# Patient Record
Sex: Male | Born: 1935 | Race: White | Hispanic: No | Smoking: Former smoker
Health system: Southern US, Community
[De-identification: ages and names within clinical notes are randomized; demographics above are authoritative.]

## PROBLEM LIST (undated history)

## (undated) DIAGNOSIS — C3490 Malignant neoplasm of unspecified part of unspecified bronchus or lung: Secondary | ICD-10-CM

## (undated) DIAGNOSIS — R972 Elevated prostate specific antigen [PSA]: Secondary | ICD-10-CM

## (undated) DIAGNOSIS — I499 Cardiac arrhythmia, unspecified: Secondary | ICD-10-CM

## (undated) DIAGNOSIS — D649 Anemia, unspecified: Secondary | ICD-10-CM

## (undated) DIAGNOSIS — K219 Gastro-esophageal reflux disease without esophagitis: Secondary | ICD-10-CM

## (undated) DIAGNOSIS — G4700 Insomnia, unspecified: Secondary | ICD-10-CM

## (undated) DIAGNOSIS — R0602 Shortness of breath: Secondary | ICD-10-CM

## (undated) DIAGNOSIS — J449 Chronic obstructive pulmonary disease, unspecified: Secondary | ICD-10-CM

## (undated) DIAGNOSIS — Z9289 Personal history of other medical treatment: Secondary | ICD-10-CM

## (undated) DIAGNOSIS — I10 Essential (primary) hypertension: Principal | ICD-10-CM

## (undated) DIAGNOSIS — I714 Abdominal aortic aneurysm, without rupture, unspecified: Secondary | ICD-10-CM

## (undated) DIAGNOSIS — M25559 Pain in unspecified hip: Secondary | ICD-10-CM

## (undated) DIAGNOSIS — N400 Enlarged prostate without lower urinary tract symptoms: Secondary | ICD-10-CM

## (undated) DIAGNOSIS — N529 Male erectile dysfunction, unspecified: Secondary | ICD-10-CM

## (undated) DIAGNOSIS — E785 Hyperlipidemia, unspecified: Secondary | ICD-10-CM

## (undated) HISTORY — DX: Male erectile dysfunction, unspecified: N52.9

## (undated) HISTORY — DX: Abdominal aortic aneurysm, without rupture, unspecified: I71.40

## (undated) HISTORY — DX: Benign prostatic hyperplasia without lower urinary tract symptoms: N40.0

## (undated) HISTORY — DX: Insomnia, unspecified: G47.00

## (undated) HISTORY — DX: Malignant neoplasm of unspecified part of unspecified bronchus or lung: C34.90

## (undated) HISTORY — PX: UPPER GASTROINTESTINAL ENDOSCOPY: SHX188

## (undated) HISTORY — DX: Pain in unspecified hip: M25.559

## (undated) HISTORY — DX: Hyperlipidemia, unspecified: E78.5

## (undated) HISTORY — PX: TOTAL HIP ARTHROPLASTY: SHX124

## (undated) HISTORY — DX: Abdominal aortic aneurysm, without rupture: I71.4

## (undated) HISTORY — DX: Elevated prostate specific antigen (PSA): R97.20

## (undated) HISTORY — DX: Essential (primary) hypertension: I10

---

## 2002-06-18 HISTORY — PX: TRANSURETHRAL RESECTION OF PROSTATE: SHX73

## 2006-04-25 ENCOUNTER — Ambulatory Visit: Payer: Self-pay | Admitting: Internal Medicine

## 2007-01-02 ENCOUNTER — Encounter: Payer: Self-pay | Admitting: Internal Medicine

## 2007-01-02 ENCOUNTER — Ambulatory Visit: Payer: Self-pay | Admitting: Internal Medicine

## 2007-01-02 DIAGNOSIS — E785 Hyperlipidemia, unspecified: Secondary | ICD-10-CM | POA: Insufficient documentation

## 2007-01-02 DIAGNOSIS — I10 Essential (primary) hypertension: Secondary | ICD-10-CM

## 2007-01-02 HISTORY — DX: Hyperlipidemia, unspecified: E78.5

## 2007-01-02 HISTORY — DX: Essential (primary) hypertension: I10

## 2007-05-02 ENCOUNTER — Encounter: Payer: Self-pay | Admitting: Internal Medicine

## 2007-07-04 ENCOUNTER — Ambulatory Visit: Payer: Self-pay | Admitting: Internal Medicine

## 2007-07-04 DIAGNOSIS — M25559 Pain in unspecified hip: Secondary | ICD-10-CM | POA: Insufficient documentation

## 2007-07-04 HISTORY — DX: Pain in unspecified hip: M25.559

## 2008-07-23 ENCOUNTER — Ambulatory Visit: Payer: Self-pay | Admitting: Internal Medicine

## 2008-07-23 DIAGNOSIS — N400 Enlarged prostate without lower urinary tract symptoms: Secondary | ICD-10-CM

## 2008-07-23 HISTORY — DX: Benign prostatic hyperplasia without lower urinary tract symptoms: N40.0

## 2008-07-26 ENCOUNTER — Telehealth: Payer: Self-pay | Admitting: Internal Medicine

## 2008-07-26 LAB — CONVERTED CEMR LAB
Alkaline Phosphatase: 53 units/L (ref 39–117)
BUN: 24 mg/dL — ABNORMAL HIGH (ref 6–23)
Basophils Relative: 0 % (ref 0–1)
Bilirubin, Direct: 0.1 mg/dL (ref 0.0–0.3)
Chloride: 104 meq/L (ref 96–112)
Creatinine, Ser: 1.48 mg/dL (ref 0.40–1.50)
Eosinophils Absolute: 0.2 10*3/uL (ref 0.0–0.7)
Glucose, Bld: 101 mg/dL — ABNORMAL HIGH (ref 70–99)
Hemoglobin: 15.6 g/dL (ref 13.0–17.0)
Indirect Bilirubin: 0.5 mg/dL (ref 0.0–0.9)
LDL Cholesterol: 91 mg/dL (ref 0–99)
Lymphs Abs: 1.6 10*3/uL (ref 0.7–4.0)
MCHC: 33.6 g/dL (ref 30.0–36.0)
MCV: 90.8 fL (ref 78.0–100.0)
Monocytes Absolute: 0.6 10*3/uL (ref 0.1–1.0)
Monocytes Relative: 8 % (ref 3–12)
Neutro Abs: 4.9 10*3/uL (ref 1.7–7.7)
Neutrophils Relative %: 68 % (ref 43–77)
Potassium: 5 meq/L (ref 3.5–5.3)
RBC: 5.11 M/uL (ref 4.22–5.81)
Total Bilirubin: 0.6 mg/dL (ref 0.3–1.2)
Total Protein: 7.2 g/dL (ref 6.0–8.3)
Triglycerides: 174 mg/dL — ABNORMAL HIGH (ref <150)
VLDL: 35 mg/dL (ref 0–40)
WBC: 7.2 10*3/uL (ref 4.0–10.5)

## 2008-08-09 ENCOUNTER — Telehealth: Payer: Self-pay | Admitting: Internal Medicine

## 2008-09-02 ENCOUNTER — Telehealth: Payer: Self-pay | Admitting: Internal Medicine

## 2008-09-03 ENCOUNTER — Encounter: Payer: Self-pay | Admitting: Internal Medicine

## 2008-09-10 ENCOUNTER — Ambulatory Visit: Payer: Self-pay | Admitting: Internal Medicine

## 2008-09-10 DIAGNOSIS — R972 Elevated prostate specific antigen [PSA]: Secondary | ICD-10-CM

## 2008-09-10 HISTORY — DX: Elevated prostate specific antigen (PSA): R97.20

## 2008-09-28 ENCOUNTER — Encounter: Admission: RE | Admit: 2008-09-28 | Discharge: 2008-09-28 | Payer: Self-pay | Admitting: Orthopedic Surgery

## 2008-10-11 ENCOUNTER — Telehealth: Payer: Self-pay | Admitting: Internal Medicine

## 2008-10-29 ENCOUNTER — Telehealth: Payer: Self-pay | Admitting: Family Medicine

## 2008-11-16 ENCOUNTER — Ambulatory Visit: Payer: Self-pay | Admitting: Internal Medicine

## 2008-11-16 DIAGNOSIS — G4700 Insomnia, unspecified: Secondary | ICD-10-CM

## 2008-11-16 HISTORY — DX: Insomnia, unspecified: G47.00

## 2008-11-26 ENCOUNTER — Inpatient Hospital Stay (HOSPITAL_COMMUNITY): Admission: RE | Admit: 2008-11-26 | Discharge: 2008-11-30 | Payer: Self-pay | Admitting: Orthopedic Surgery

## 2008-12-05 ENCOUNTER — Telehealth: Payer: Self-pay | Admitting: Internal Medicine

## 2008-12-06 ENCOUNTER — Telehealth: Payer: Self-pay | Admitting: Internal Medicine

## 2009-01-12 ENCOUNTER — Telehealth: Payer: Self-pay | Admitting: Internal Medicine

## 2009-02-02 ENCOUNTER — Telehealth (INDEPENDENT_AMBULATORY_CARE_PROVIDER_SITE_OTHER): Payer: Self-pay | Admitting: *Deleted

## 2009-02-07 ENCOUNTER — Telehealth: Payer: Self-pay | Admitting: Internal Medicine

## 2009-06-09 ENCOUNTER — Ambulatory Visit: Payer: Self-pay | Admitting: Internal Medicine

## 2009-07-12 ENCOUNTER — Ambulatory Visit: Payer: Self-pay | Admitting: Internal Medicine

## 2009-07-12 DIAGNOSIS — L723 Sebaceous cyst: Secondary | ICD-10-CM

## 2009-07-15 ENCOUNTER — Ambulatory Visit: Payer: Self-pay | Admitting: Internal Medicine

## 2009-07-15 DIAGNOSIS — R310 Gross hematuria: Secondary | ICD-10-CM | POA: Insufficient documentation

## 2009-07-15 LAB — CONVERTED CEMR LAB
Bilirubin Urine: NEGATIVE
Glucose, Urine, Semiquant: NEGATIVE
Ketones, urine, test strip: NEGATIVE
Protein, U semiquant: NEGATIVE
Urobilinogen, UA: 0.2
pH: 5

## 2009-07-20 ENCOUNTER — Encounter: Payer: Self-pay | Admitting: Internal Medicine

## 2009-07-27 ENCOUNTER — Telehealth: Payer: Self-pay | Admitting: Internal Medicine

## 2009-08-03 ENCOUNTER — Telehealth: Payer: Self-pay | Admitting: Internal Medicine

## 2009-08-11 ENCOUNTER — Encounter: Payer: Self-pay | Admitting: Internal Medicine

## 2009-08-17 ENCOUNTER — Inpatient Hospital Stay (HOSPITAL_COMMUNITY): Admission: RE | Admit: 2009-08-17 | Discharge: 2009-08-20 | Payer: Self-pay | Admitting: Orthopedic Surgery

## 2009-09-26 ENCOUNTER — Encounter: Payer: Self-pay | Admitting: Internal Medicine

## 2009-09-27 ENCOUNTER — Telehealth: Payer: Self-pay

## 2009-11-21 ENCOUNTER — Telehealth: Payer: Self-pay

## 2009-11-22 ENCOUNTER — Ambulatory Visit: Payer: Self-pay | Admitting: Internal Medicine

## 2009-11-22 DIAGNOSIS — N529 Male erectile dysfunction, unspecified: Secondary | ICD-10-CM | POA: Insufficient documentation

## 2009-11-22 HISTORY — DX: Male erectile dysfunction, unspecified: N52.9

## 2010-01-20 ENCOUNTER — Telehealth: Payer: Self-pay | Admitting: Internal Medicine

## 2010-01-27 ENCOUNTER — Encounter: Payer: Self-pay | Admitting: Internal Medicine

## 2010-02-06 ENCOUNTER — Telehealth: Payer: Self-pay

## 2010-02-28 ENCOUNTER — Telehealth: Payer: Self-pay | Admitting: Internal Medicine

## 2010-04-14 ENCOUNTER — Telehealth: Payer: Self-pay | Admitting: Internal Medicine

## 2010-07-08 ENCOUNTER — Encounter: Payer: Self-pay | Admitting: Urology

## 2010-07-18 NOTE — Assessment & Plan Note (Signed)
Summary: EVAL OF BLISTER/BOIL (REMOVAL/LANCE) // RS   Vital Signs:  Patient profile:   77 year old male Weight:      196 pounds Temp:     98.4 degrees F oral BP sitting:   140 / 90  (left arm) Cuff size:   regular  Vitals Entered By: Raechel Ache, RN ([birth year] 2:13 PM)  Procedure Note  Cyst Removal: The patient complains of swelling and changing lesion. Date of onset: 06/08/2008 Indication: changing lesion Consent signed: no  Procedure #1: incision and drainage    Size (in cm): 1.0 x 0.8    Region: dorsal    Location: left first index finger    Instrument used: #15 blade    Anesthesia: 1% lidocaine w/o epinephrine  Cleaned and prepped with: betadine Wound dressing: neosporin and bandaid Additional Instructions: Change Band-Aid and apply Neosporin daily  CC: C/o ?cyst/boil L index finger. Is Patient Diabetic? No   CC:  C/o ?cyst/boil L index finger.Marland Kitchen  History of Present Illness: 77 year old patient with a 6-week history of enlarging nodule involving the dorsal aspect of his right index finger just distal to the DIP joint.  No known, or foreign body exposure  Allergies: No Known Drug Allergies   Complete Medication List: 1)  Simvastatin 20 Mg Tabs (Simvastatin) .Marland Kitchen.. 1 once daily 2)  Diltiazem Hcl Coated Beads 240 Mg Cp24 (Diltiazem hcl coated beads) .Marland Kitchen.. 1 once daily 3)  Cialis 5 Mg Tabs (Tadalafil) .... One daily 4)  Chlorthalidone 25 Mg Tabs (Chlorthalidone) .... One daily  Other Orders: I&D Abscess, Simple / Single (10060)

## 2010-07-18 NOTE — Progress Notes (Signed)
Summary: hip pain  Phone Note Call from Patient Call back at [facility] Phone [phone]   Summary of Call: Trouble right hip.  Might need another hip replacement.  No health insurance.  Green card.  Wonders if there is some means of delaying surgery.  Requests Dr. Kirtland Bouchard call him himself tomorrow am.   Initial call taken by: Rudy Jew, RN,  July 27, 2009 9:49 AM  Follow-up for Phone Call        pt called again.  call [phone] Follow-up by: Warnell Forester,  July 28, 2009 1:24 PM  Additional Follow-up for Phone Call Additional follow up Details #1::        called Additional Follow-up by: Gordy Savers  MD,  July 28, 2009 4:58 PM

## 2010-07-18 NOTE — Progress Notes (Signed)
Summary: Pts growth on finger has reoccured.   Phone Note Call from Patient Call back at [facility] Phone [phone]   Caller: Patient Summary of Call: Pt called and said that his finger a growth on it that has reoccured. Pt would like to know if he needs a referral or to come in to be re-eval.   Pt doesnt have insurance.   Pls leave detailed mssge if pt not available.  Initial call taken by: Lucy Antigua,  November 21, 2009 11:50 AM  Follow-up for Phone Call        attempted to call pt - will need to see to eval - last seen in 06/2009 . call and make appt - left info on ans mach at hm#   KIK Follow-up by: Duard Brady LPN,  November 22, 979 1:59 PM

## 2010-07-18 NOTE — Progress Notes (Signed)
Summary: please return call  Phone Note Call from Patient Call back at Home Phone [phone]   Caller: Patient---live call Summary of Call: pt said he had surgery on his finger in june. Ins company is saying something else. Initial call taken by: Warnell Forester,  April 14, 2010 1:28 PM  Follow-up for Phone Call        Insurance company wants to know if you did a procedure in June 2011 on his finger and what the ICD9 code Follow-up by: Alfred Levins, CMA,  April 14, 2010 1:32 PM  Additional Follow-up for Phone Call Additional follow up Details #1::        dermoid cyst (M9084/0) cryotherapy performed Additional Follow-up by: Gordy Savers  MD,  April 14, 2010 4:54 PM    Additional Follow-up for Phone Call Additional follow up Details #2::    LMTCB Follow-up by: Alfred Levins, CMA,  April 17, 2010 1:33 PM

## 2010-07-18 NOTE — Assessment & Plan Note (Signed)
Summary: hematuria/dm   Vital Signs:  Patient profile:   77 year old male Weight:      197 pounds Temp:     98.5 degrees F oral BP sitting:   142 / 84  (left arm) Cuff size:   regular  Vitals Entered By: Raechel Ache, RN (July 15, 2009 10:46 AM) CC: C/o blood in urine last night Is Patient Diabetic? No   CC:  C/o blood in urine last night.  History of Present Illness: 77 year old patient, who presents after an episode of painless gross hematuria yesterday.  No history kidney stones, or similar episodes in the past.  He does have a history of a prior TURP.  Today he feels well and noted on his first void specimen that it appeared normal.  A UA was reviewed today and revealed microscopic hematuria  Allergies: No Known Drug Allergies  Past History:  Past Medical History: Hyperlipidemia Hypertension left hip pain Benign prostatic hypertrophy gross hematuria, January 2011  Past Surgical History: melanoma left leg status post TURP 2004  No prior colonoscopy  status post left total hip replacement surgery  Review of Systems       The patient complains of hematuria.  The patient denies anorexia, fever, weight loss, weight gain, vision loss, decreased hearing, hoarseness, chest pain, syncope, dyspnea on exertion, peripheral edema, prolonged cough, headaches, hemoptysis, abdominal pain, melena, hematochezia, severe indigestion/heartburn, incontinence, genital sores, muscle weakness, suspicious skin lesions, transient blindness, difficulty walking, depression, unusual weight change, abnormal bleeding, enlarged lymph nodes, angioedema, breast masses, and testicular masses.    Physical Exam  General:  Well-developed,well-nourished,in no acute distress; alert,appropriate and cooperative throughout examination   Impression & Recommendations:  Problem # 1:  GROSS HEMATURIA (ICD-599.71)  Orders: UA Dipstick w/o Micro (manual) ([address]) Urology Referral (Urology)  Problem #  2:  BENIGN PROSTATIC HYPERTROPHY (ICD-600.00)  Problem # 3:  PROSTATE SPECIFIC ANTIGEN, ELEVATED (ICD-790.93)  Complete Medication List: 1)  Simvastatin 20 Mg Tabs (Simvastatin) .Marland Kitchen.. 1 once daily 2)  Diltiazem Hcl Coated Beads 240 Mg Cp24 (Diltiazem hcl coated beads) .Marland Kitchen.. 1 once daily 3)  Cialis 5 Mg Tabs (Tadalafil) .... One daily 4)  Chlorthalidone 25 Mg Tabs (Chlorthalidone) .... One daily  Patient Instructions: 1)  urology referral as scheduled 2)  Please schedule a follow-up appointment as needed.  Laboratory Results   Urine Tests    Routine Urinalysis   Color: yellow Appearance: Clear Glucose: negative   (Normal Range: Negative) Bilirubin: negative   (Normal Range: Negative) Ketone: negative   (Normal Range: Negative) Spec. Gravity: <1.005   (Normal Range: 1.003-1.035) Blood: moderate   (Normal Range: Negative) pH: 5.0   (Normal Range: 5.0-8.0) Protein: negative   (Normal Range: Negative) Urobilinogen: 0.2   (Normal Range: 0-1) Nitrite: negative   (Normal Range: Negative) Leukocyte Esterace: negative   (Normal Range: Negative)

## 2010-07-18 NOTE — Progress Notes (Signed)
Summary: ins form mailed  Phone Note Outgoing Call   Call placed by: Duard Brady LPN,  February 06, 2010 9:14 AM Call placed to: Patient Summary of Call: called and left msg - ins form mailed to house. KIK Initial call taken by: Duard Brady LPN,  February 06, 2010 9:15 AM

## 2010-07-18 NOTE — Letter (Signed)
Summary: Attending Physician's Statement/[employer]  Attending Physician's Statement/[employer]   Imported By: Maryln Gottron 09/29/2009 13:32:06  _____________________________________________________________________  External Attachment:    Type:   Image     Comment:   External Document

## 2010-07-18 NOTE — Progress Notes (Signed)
Summary: recurring mass on finger  Phone Note Call from Patient   Caller: Patient Call For: Gordy Savers  MD Summary of Call: Pt would like to talk to Dr. Kirtland Bouchard about this recurring lesion on finger. [phone] Initial call taken by: Lynann Beaver CMA,  January 20, 2010 10:38 AM  Follow-up for Phone Call        refer dermatology Follow-up by: Gordy Savers  MD,  January 20, 2010 12:56 PM  Additional Follow-up for Phone Call Additional follow up Details #1::        Pt notified.  Order sent to Terri. Additional Follow-up by: Lynann Beaver CMA,  January 20, 2010 1:27 PM

## 2010-07-18 NOTE — Progress Notes (Signed)
Summary: question about referral  Phone Note Call from Patient   Caller: Patient Call For: Gordy Savers  MD Summary of Call: wondering if it would be better to go to surgeon - paying out of pocket - if dermatolgy is going to send to surgeon would rather go there first.  Initial call taken by: Duard Brady LPN,  January 20, 2010 4:43 PM

## 2010-07-18 NOTE — Progress Notes (Signed)
Summary: Pt req samples of Levitra or a script  Phone Note Call from Patient Call back at Home Phone [phone]   Caller: Patient Summary of Call: Pt req samples of Levitra or call in a script to [facility] on [address] [phone] [facility] is too expensive.         Initial call taken by: Lucy Antigua,  February 28, 2010 2:42 PM  Follow-up for Phone Call        levitra 20 mg  #6 i/2 tablt as needed  RF 4 Follow-up by: Gordy Savers  MD,  February 28, 2010 4:50 PM    Prescriptions: LEVITRA 20 MG TABS (VARDENAFIL HCL) use daily as directed  #6 x 6   Entered by:   Duard Brady LPN   Authorized by:   Gordy Savers  MD   Signed by:   Duard Brady LPN on 95/18/8416   Method used:   Electronically to        [facility] [address] (retail)       [phone]       [place], [place]  [address]       Ph: [phone]       Fax: [phone]   RxID:   [id number]

## 2010-07-18 NOTE — Assessment & Plan Note (Signed)
Summary: RECURRING LUMP ON FINGER/PER KIM/CJR   Vital Signs:  Patient profile:   77 year old male Weight:      193 pounds Temp:     98.2 degrees F oral BP sitting:   130 / 80  (right arm) Cuff size:   regular  Vitals Entered By: Duard Brady LPN (November 22, 1025 2:34 PM) CC: c/o re-growth in (L) index finger , painful and drainage Is Patient Diabetic? No   CC:  c/o re-growth in (L) index finger  and painful and drainage.  History of Present Illness: a 77 year old patient who is seen today for follow-up.  He has recurrent inflammatory nodule involving the dorsal aspect of his left index finger.  This was I&D in January of this year.  Other complaints include ED  Allergies: No Known Drug Allergies  Physical Exam  General:  Well-developed,well-nourished,in no acute distress; alert,appropriate and cooperative throughout examination Skin:  1 cm inflammatory nodule already draining spontaneously.  This area was decompressed with pressure.  The inflammatory shallow ulcer was then treated with cryotherapy   Impression & Recommendations:  Problem # 1:  SEBACEOUS CYST (ICD-706.2)  Problem # 2:  ERECTILE DYSFUNCTION, ORGANIC (ICD-607.84)  His updated medication list for this problem includes:    Cialis 5 Mg Tabs (Tadalafil) ..... One daily    Levitra 20 Mg Tabs (Vardenafil hcl) ..... Use daily as directed  His updated medication list for this problem includes:    Cialis 5 Mg Tabs (Tadalafil) ..... One daily    Levitra 20 Mg Tabs (Vardenafil hcl) ..... Use daily as directed  Complete Medication List: 1)  Diltiazem Hcl Coated Beads 240 Mg Cp24 (Diltiazem hcl coated beads) .Marland Kitchen.. 1 once daily 2)  Cialis 5 Mg Tabs (Tadalafil) .... One daily 3)  Chlorthalidone 25 Mg Tabs (Chlorthalidone) .... One daily 4)  Levitra 20 Mg Tabs (Vardenafil hcl) .... Use daily as directed  Patient Instructions: 1)  Limit your Sodium (Salt) to less than 2 grams a day(slightly less than 1/2 a teaspoon)  to prevent fluid retention, swelling, or worsening of symptoms. 2)  It is important that you exercise regularly at least 20 minutes 5 times a week. If you develop chest pain, have severe difficulty breathing, or feel very tired , stop exercising immediately and seek medical attention. 3)  You need to lose weight. Consider a lower calorie diet and regular exercise.  4)  Check your Blood Pressure regularly. If it is above: 150/90 you should make an appointment. Prescriptions: LEVITRA 20 MG TABS (VARDENAFIL HCL) use daily as directed  #6 x 6   Entered and Authorized by:   Gordy Savers  MD   Signed by:   Gordy Savers  MD on 11/22/2009   Method used:   Electronically to        [facility] [phone]* (retail)       [address]       [place], [place]  [address]       Ph: [phone]       Fax: [phone]   RxID:   [id number] CHLORTHALIDONE 25 MG TABS (CHLORTHALIDONE) one daily  #90 x 6   Entered and Authorized by:   Gordy Savers  MD   Signed by:   Gordy Savers  MD on 11/22/2009   Method used:   Electronically to        [facility] # [phone]* (retail)       [phone]  [place], [place]  [address]       Ph: [phone]       Fax: [phone]   RxID:   [id number] DILTIAZEM HCL COATED BEADS 240 MG  CP24 (DILTIAZEM HCL COATED BEADS) 1 once daily  #90 x 6   Entered and Authorized by:   Gordy Savers  MD   Signed by:   Gordy Savers  MD on 11/22/2009   Method used:   Electronically to        [facility] # [phone]* (retail)       [address]       [place], [place]  [address]       Ph: [phone]       Fax: [phone]   RxID:   [id number]

## 2010-07-18 NOTE — Letter (Signed)
Summary: Generic Letter  [facility]  [address] [place], [place] [address]   Phone: [phone]  Fax: [phone]    08/11/2009  Tyler [name] [address] [place], [place]  [address]  Dear Tyler [name]:  [name] is a patient followed in our internal medicine practice.  He successfully underwent left total hip replacement surgery in 2010 and did quite well.  He is considering right total hip replacement surgery due to lifestyle limiting pain. [name] was last seen in our office on January 28 of this year.  At that time, he was clinically stable and denied any cardiopulmonary complaints.   There are no contraindications to the anticipated surgery.  If medical assistance is needed please do not hesitate to contact this office.       Sincerely,   Eleonore Chiquito  MD

## 2010-07-18 NOTE — Consult Note (Signed)
Summary: [facility]  [facility]   Imported By: Maryln Gottron 07/22/2009 15:29:27  _____________________________________________________________________  External Attachment:    Type:   Image     Comment:   External Document

## 2010-07-18 NOTE — Progress Notes (Signed)
Summary: medical clearance  Phone Note Call from Patient   Caller: Patient Call For: Gordy Savers  MD Summary of Call: Needs medical clearance for surgery (hip replacement).  Does not want to make an appt. [phone]  Surgery is March 2. Initial call taken by: Lynann Beaver CMA,  August 03, 2009 10:40 AM  Follow-up for Phone Call        PT CALL BACK NEEDING STATUE OF MEDICAL CLEARANCE DR Despina Hick IS DOING SURGERY. PT WILL BE LEAVING TOWN THIS WEEKEND Follow-up by: Heron Sabins,  August 05, 2009 11:36 AM  Additional Follow-up for Phone Call Additional follow up Details #1::        please fax letter to Dr Darrelyn Hillock and notify patient that letter has been sent Additional Follow-up by: Gordy Savers  MD,  August 11, 2009 5:09 PM    Additional Follow-up for Phone Call Additional follow up Details #2::    letter faxed and pt notified by voice mail. Follow-up by: Lynann Beaver CMA,  August 12, 2009 8:57 AM

## 2010-07-18 NOTE — Letter (Signed)
Summary: Attending Physician's Statement/[employer]  Attending Physician's Statement/[employer]   Imported By: Maryln Gottron 02/10/2010 09:16:22  _____________________________________________________________________  External Attachment:    Type:   Image     Comment:   External Document

## 2010-07-18 NOTE — Progress Notes (Signed)
Summary: ppwk ready for pick up   Phone Note Outgoing Call   Call placed by: Duard Brady LPN,  September 27, 2009 8:18 AM Call placed to: Patient Summary of Call: pt aware ppwk ready for pick up . KIK Initial call taken by: Duard Brady LPN,  September 27, 2009 8:19 AM

## 2010-09-06 LAB — COMPREHENSIVE METABOLIC PANEL
ALT: 21 U/L (ref 0–53)
Albumin: 3.9 g/dL (ref 3.5–5.2)
Alkaline Phosphatase: 54 U/L (ref 39–117)
Chloride: 105 mEq/L (ref 96–112)
Glucose, Bld: 101 mg/dL — ABNORMAL HIGH (ref 70–99)
Potassium: 4.4 mEq/L (ref 3.5–5.1)
Sodium: 142 mEq/L (ref 135–145)
Total Bilirubin: 0.7 mg/dL (ref 0.3–1.2)
Total Protein: 6.9 g/dL (ref 6.0–8.3)

## 2010-09-06 LAB — TYPE AND SCREEN: Antibody Screen: NEGATIVE

## 2010-09-06 LAB — URINALYSIS, ROUTINE W REFLEX MICROSCOPIC
Glucose, UA: NEGATIVE mg/dL
Ketones, ur: NEGATIVE mg/dL
Nitrite: NEGATIVE
Specific Gravity, Urine: 1.016 (ref 1.005–1.030)
pH: 7.5 (ref 5.0–8.0)

## 2010-09-06 LAB — CBC
HCT: 47 % (ref 39.0–52.0)
Hemoglobin: 15.5 g/dL (ref 13.0–17.0)
RDW: 13 % (ref 11.5–15.5)
WBC: 6.7 10*3/uL (ref 4.0–10.5)

## 2010-09-06 LAB — PROTIME-INR: Prothrombin Time: 13.6 seconds (ref 11.6–15.2)

## 2010-09-11 LAB — BASIC METABOLIC PANEL
CO2: 25 mEq/L (ref 19–32)
CO2: 27 mEq/L (ref 19–32)
Calcium: 7.8 mg/dL — ABNORMAL LOW (ref 8.4–10.5)
Chloride: 101 mEq/L (ref 96–112)
Chloride: 103 mEq/L (ref 96–112)
Chloride: 98 mEq/L (ref 96–112)
GFR calc Af Amer: 51 mL/min — ABNORMAL LOW (ref >60)
GFR calc non Af Amer: 42 mL/min — ABNORMAL LOW (ref >60)
GFR calc non Af Amer: 42 mL/min — ABNORMAL LOW (ref >60)
Glucose, Bld: 134 mg/dL — ABNORMAL HIGH (ref 70–99)
Potassium: 3.9 mEq/L (ref 3.5–5.1)
Potassium: 4.2 mEq/L (ref 3.5–5.1)
Potassium: 4.3 mEq/L (ref 3.5–5.1)
Sodium: 132 mEq/L — ABNORMAL LOW (ref 135–145)
Sodium: 135 mEq/L (ref 135–145)
Sodium: 136 mEq/L (ref 135–145)

## 2010-09-11 LAB — CBC
HCT: 30.4 % — ABNORMAL LOW (ref 39.0–52.0)
HCT: 33 % — ABNORMAL LOW (ref 39.0–52.0)
HCT: 35.3 % — ABNORMAL LOW (ref 39.0–52.0)
Hemoglobin: 11.1 g/dL — ABNORMAL LOW (ref 13.0–17.0)
Hemoglobin: 12 g/dL — ABNORMAL LOW (ref 13.0–17.0)
MCV: 93.2 fL (ref 78.0–100.0)
Platelets: 138 10*3/uL — ABNORMAL LOW (ref 150–400)
RDW: 12.8 % (ref 11.5–15.5)
WBC: 8.8 10*3/uL (ref 4.0–10.5)
WBC: 9.3 10*3/uL (ref 4.0–10.5)

## 2010-09-25 LAB — COMPREHENSIVE METABOLIC PANEL
BUN: 21 mg/dL (ref 6–23)
CO2: 32 mEq/L (ref 19–32)
Calcium: 9.7 mg/dL (ref 8.4–10.5)
Creatinine, Ser: 1.51 mg/dL — ABNORMAL HIGH (ref 0.4–1.5)
GFR calc Af Amer: 55 mL/min — ABNORMAL LOW (ref >60)
GFR calc non Af Amer: 46 mL/min — ABNORMAL LOW (ref >60)
Glucose, Bld: 107 mg/dL — ABNORMAL HIGH (ref 70–99)

## 2010-09-25 LAB — BASIC METABOLIC PANEL
BUN: 13 mg/dL (ref 6–23)
CO2: 30 mEq/L (ref 19–32)
Calcium: 8 mg/dL — ABNORMAL LOW (ref 8.4–10.5)
Chloride: 95 mEq/L — ABNORMAL LOW (ref 96–112)
Chloride: 97 mEq/L (ref 96–112)
GFR calc Af Amer: 57 mL/min — ABNORMAL LOW (ref >60)
GFR calc Af Amer: >60 mL/min (ref >60)
GFR calc non Af Amer: 52 mL/min — ABNORMAL LOW (ref >60)
GFR calc non Af Amer: 52 mL/min — ABNORMAL LOW (ref >60)
Glucose, Bld: 131 mg/dL — ABNORMAL HIGH (ref 70–99)
Potassium: 3.6 mEq/L (ref 3.5–5.1)
Potassium: 4 mEq/L (ref 3.5–5.1)
Potassium: 4.4 mEq/L (ref 3.5–5.1)
Sodium: 128 mEq/L — ABNORMAL LOW (ref 135–145)
Sodium: 132 mEq/L — ABNORMAL LOW (ref 135–145)
Sodium: 135 mEq/L (ref 135–145)

## 2010-09-25 LAB — CBC
HCT: 30.8 % — ABNORMAL LOW (ref 39.0–52.0)
HCT: 30.8 % — ABNORMAL LOW (ref 39.0–52.0)
HCT: 36.3 % — ABNORMAL LOW (ref 39.0–52.0)
HCT: 44.7 % (ref 39.0–52.0)
Hemoglobin: 10.3 g/dL — ABNORMAL LOW (ref 13.0–17.0)
Hemoglobin: 10.9 g/dL — ABNORMAL LOW (ref 13.0–17.0)
Hemoglobin: 12.1 g/dL — ABNORMAL LOW (ref 13.0–17.0)
Hemoglobin: 15 g/dL (ref 13.0–17.0)
MCHC: 33.4 g/dL (ref 30.0–36.0)
MCHC: 33.6 g/dL (ref 30.0–36.0)
MCV: 92.1 fL (ref 78.0–100.0)
MCV: 92.4 fL (ref 78.0–100.0)
MCV: 92.5 fL (ref 78.0–100.0)
Platelets: 143 10*3/uL — ABNORMAL LOW (ref 150–400)
Platelets: 241 10*3/uL (ref 150–400)
RBC: 3.33 MIL/uL — ABNORMAL LOW (ref 4.22–5.81)
RBC: 3.95 MIL/uL — ABNORMAL LOW (ref 4.22–5.81)
RBC: 4.83 MIL/uL (ref 4.22–5.81)
RDW: 12.6 % (ref 11.5–15.5)
RDW: 13.4 % (ref 11.5–15.5)
WBC: 10.3 10*3/uL (ref 4.0–10.5)
WBC: 7.8 10*3/uL (ref 4.0–10.5)
WBC: 9.2 10*3/uL (ref 4.0–10.5)

## 2010-09-25 LAB — URINALYSIS, ROUTINE W REFLEX MICROSCOPIC
Glucose, UA: NEGATIVE mg/dL
Ketones, ur: NEGATIVE mg/dL
Ketones, ur: NEGATIVE mg/dL
Nitrite: NEGATIVE
Protein, ur: 30 mg/dL — AB
Protein, ur: NEGATIVE mg/dL
pH: 5.5 (ref 5.0–8.0)

## 2010-09-25 LAB — URINE MICROSCOPIC-ADD ON

## 2010-09-25 LAB — PROTIME-INR
INR: 1.1 (ref 0.00–1.49)
INR: 2.1 — ABNORMAL HIGH (ref 0.00–1.49)
INR: 2.1 — ABNORMAL HIGH (ref 0.00–1.49)
Prothrombin Time: 14.2 seconds (ref 11.6–15.2)
Prothrombin Time: 24.8 seconds — ABNORMAL HIGH (ref 11.6–15.2)

## 2010-09-25 LAB — APTT: aPTT: 34 seconds (ref 24–37)

## 2010-09-25 LAB — ABO/RH: ABO/RH(D): O NEG

## 2010-09-25 LAB — TYPE AND SCREEN: ABO/RH(D): O NEG

## 2010-10-31 NOTE — H&P (Signed)
NAME:  ROWLAND, ERICSSON NO.:  [id number]   MEDICAL RECORD NO.:  [id number]          PATIENT TYPE:  INP   LOCATION:  NA                           FACILITY:  [facility]   PHYSICIAN:  Ollen Gross, M.D.    DATE OF BIRTH:  Dec 11, 1932   DATE OF ADMISSION:  DATE OF DISCHARGE:                              HISTORY & PHYSICAL   CHIEF COMPLAINT AND HISTORY OF PRESENT ILLNESS:  Chief complaint left  hip pain.   HISTORY OF PRESENT ILLNESS:  The patient is 77 year old male who is seen  by Dr. Lequita Halt for ongoing hip pain significant pain for about 6 months  now.  Has been seen and treated and followed up by Dr. Lequita Halt and found  have end-stage arthritis of left hip with bone-on-bone.  Is thought he  reached the point where benefit undergoing surgical intervention.  Risks  and benefits have been discussed.  Elects to surgery.   ALLERGIES:  No known drug allergies.   CURRENT MEDICATIONS:  1. Diltiazem 240 mg.  2. Simvastatin 20 mg.  3. Hydrochlorothiazide.  4. Centrum multivitamin.   PAST MEDICAL HISTORY:  Hypertension, hypercholesterolemia.   PAST SURGICAL HISTORY:  Melanoma and prostate surgery.   FAMILY HISTORY:  Mother with diabetes.   SOCIAL HISTORY:  Widowed, retired Teacher, early years/pre.  Past smoker.  No alcohol.  Lives alone.   REVIEW OF SYSTEMS:  GENERAL:  No fevers, chills, night sweats.  Seizures  or paralysis.  RESPIRATORY:  No shortness breath, productive cough or  hemoptysis.  CARDIOVASCULAR:  Chest pain, GI: Some constipation.  No  nausea, diarrhea.  GU: No dysuria or discharge.  MUSCULOSKELETAL:  Left  hip.   PHYSICAL EXAM:  VITAL SIGNS:  Pulse 68, respirations 12, blood pressure  122/74.  GENERAL:  A 77 year old white male well-nourished, well-developed,  anxious, alert and cooperative, pleasant.  HEENT:  Normocephalic, atraumatic.  Pupils are reactive.  EOMs intact.  NECK:  Supple.  CHEST:  Clear.  HEART:  Regular rate and rhythm without murmur, occasional  ectopic beat.  ABDOMEN:  Soft, slightly round.  Bowel sounds present.  RECTAL, BREASTS, GENITALIA:  Not done, not pertinent to present illness.  EXTREMITIES:  Left hip flexion 90-0, internal rotation 0, external  rotation 20 degrees abduction.   IMPRESSION:  Osteoarthritis left hip.   PLAN:  The patient was admitted to [facility] to undergo a  left total replacement arthroplasty.  Surgery will be performed by Ollen Gross.      Alexzandrew L. Perkins, P.A.C.      Ollen Gross, M.D.  Electronically Signed    ALP/MEDQ  D:  11/25/2008  T:  11/26/2008  Job:  [id number]

## 2010-10-31 NOTE — Op Note (Signed)
NAME:  Tyler [name], Tyler [name] NO.:  [id number]   MEDICAL RECORD NO.:  [id number]          PATIENT TYPE:  INP   LOCATION:  0005                         FACILITY:  [facility]   PHYSICIAN:  Ollen Gross, M.D.    DATE OF BIRTH:  01-16-1933   DATE OF PROCEDURE:  11/26/2008  DATE OF DISCHARGE:                               OPERATIVE REPORT   PREOPERATIVE DIAGNOSES:  Osteoarthritis left hip.   POSTOPERATIVE DIAGNOSES:  Osteoarthritis left hip.   PROCEDURE:  Left total hip arthroplasty.   SURGEON:  Ollen Gross, M.D.   ASSISTANT:  Avel Peace PA-C   ANESTHESIA:  General.   ESTIMATED BLOOD LOSS:  300.   DRAIN:  Hemovac times one.   COMPLICATIONS:  None.   CONDITION.:  Stable to recovery.   BRIEF CLINICAL NOTE:  Tyler [name] is a  77 year old male with severe end-  stage arthritis of the left hip with progressively worsening pain and  dysfunction.  His course has been rapidly progressive.  He presents now  for left total hip arthroplasty.   PROCEDURE IN DETAIL:  After successful administration of general  anesthetic, the patient is placed in the right lateral decubitus  position with the left side up and held with the hip positioner.  The  left lower extremity is isolated from his perineum with plastic drapes  and prepped and draped in the usual sterile fashion.  Short  posterolateral incision is made with a 10 blade through the subcutaneous  tissue to the fascia lata which was incised in line with the skin  incision.  Sciatic nerve was palpated and protected and short external  rotator was isolated off the femur.  Capsulectomy is performed and the  hip is dislocated.  The center of femoral head is marked and trial  prosthesis placed such that the center of the trial head corresponds to  the center of his native femoral head.  Osteotomy line is marked on the  femoral neck and osteotomy made with an oscillating saw.  Femoral head  is removed and femur retracted anteriorly  to gain acetabular exposure.   Acetabular retractors were placed and labrum and osteophytes removed.  Acetabular reaming begins at 47 mm coursing increments of 2 to 57 mm and  a 58-mm Pinnacle acetabular shell was impacted in anatomic position with  outstanding purchase.  There is no need for any dome screws.  The trial  36-mm neutral +4 liner was placed.   The femur was prepared  with the canal finder and irrigation.  Axial  reaming is performed to 15.5 mm.  The proximal reaming performed to a 20  F and the sleeve machined to a large.  A 20 F large trial sleeve is  placed with 20 x 15 stem at 36 plus 12 neck matching native anteversion.  The 36 plus 0 head is placed and reduces easily so I went to a 36 +3  which had more appropriate soft-tissue tension.  The stability was great  with full extension, full external rotation, 70 degrees of flexion, 40  degrees of adduction and  90 degrees of internal rotation and 90 degrees  of flexion and 70 degrees of internal rotation.  By placing the left leg  on top of the right I felt as though the leg lengths are equal.  Hips  then dislocated and all trials were removed.  The apex hole eliminator  is placed in acetabular shell and then the 36 mm neutral +4 Marathon  liner was placed in the acetabulum.  On the femoral side we placed the  20 F large sleeve with a 20 x 15 stem and 36 plus 12 neck matching  native anteversion.  The 36 plus 3 head is placed in the hips reduced  with the same stability parameters.  The wound was copiously irrigated  with saline solution and short rotators reattached to the femur through  drill holes.  Fascia lata was closed over Hemovac drain with interrupted  #1 Vicryl.  Subcu was closed with #1 and 2-0 Vicryl and subcuticular  running 4-0 Monocryl.  The drain hooked to suction.  Incision cleaned  and dried and Steri-Strips and bulky sterile dressing applied.  He is  then awakened and transported to recovery in stable  condition.      Ollen Gross, M.D.  Electronically Signed     FA/MEDQ  D:  11/26/2008  T:  11/27/2008  Job:  [id number]

## 2010-11-03 NOTE — Discharge Summary (Signed)
[name], [name] NO.:  [id number]   MEDICAL RECORD NO.:  [id number]          PATIENT TYPE:  INP   LOCATION:  1606                         FACILITY:  [facility]   PHYSICIAN:  Ollen Gross, M.D.    DATE OF BIRTH:  Jul 28, 1932   DATE OF ADMISSION:  11/26/2008  DATE OF DISCHARGE:  11/30/2008                               DISCHARGE SUMMARY   ADMITTING DIAGNOSES:  1. Osteoarthritis left hip.  2. Hypertension.  3. Hypercholesterolemia.   DISCHARGE DIAGNOSES:  1. Osteoarthritis left hip status post left total hip replacement      arthroplasty.  2. Postoperative acute blood loss anemia, did not require transfusion.  3. Postoperative hyponatremia, improving.  4. Postoperative atelectasis.  5. Osteoarthritis left hip.  6. Hypertension.  7. Hypercholesterolemia.   PROCEDURE:  November 26, 2008, left total hip, surgeon Dr. Lequita Halt,  assistant Avel Peace PA-C, anesthesia general.   CONSULTS:  None.   BRIEF HISTORY:  Tyler [name] is a 75-year male with severe end-stage  arthritis of the left hip, progressively worsening pain and dysfunction.  His course of his osteoarthritis has been rapidly progressive, now  presents for a total hip arthroplasty.   LABORATORY DATA:  Preop CBC showed a hemoglobin of 15.0, hematocrit of  44.7, white cell count 7.8, platelets 241.  Serial CBCs were followed,  dropped down to 12.1 and then 10.3, last night H and H back up to 10.9  with a hematocrit of 30.5.  PT/PTT preop 14.2 and 34 respectively, INR  1.1.  Serial prothrombin times followed per Coumadin protocol.  Last  night, PT/INR 24.8 and 2.1.  Chem panel on admission slightly elevated  creatinine 1.5, came down to normal level of 1.3.  Sodium dropped from  142 to 128, back up to 132.  Remaining electrolytes remained within  normal limits.  Preop UA negative except for cloudiness.  Follow-up UA  on June 13th showed moderate hemoglobin, trace leukocyte, 0-2 white, 0-2  red, blood group  type O negative.  Hip film preop November 19, 2008,  bilateral hip osteoarthritis left greater than right.  Portable pelvis  and hip films November 26, 2008, satisfactory procedure left total hip.  Portable chest November 28, 2008, linear infrahilar atelectasis or scarring  left greater than right.   HOSPITAL COURSE:  The patient admitted to [facility], taken to  the OR, underwent above-stated procedure without complication.  The  patient tolerated the procedure well, later transferred to the recovery  room and orthopedic floor, started on a PCA and p.o. analgesic for pain  control following surgery.  Doing pretty well in the morning of day 1,  started getting up out of bed, discontinued the knee immobilizer,  Hemovac drain pulled without difficulty.  He started partial  weightbearing.  By day 2 he was doing well, dressing changed, incision  looked good.  Hemoglobin looked stable, although his sodium was down.  We discontinued the fluids and rechecked his sodium level.  He was  slowly progressing, walking about 28 feet.  By day 3 he was feeling a  little weak, had run a little temperature the day before.  Checked a  chest x-ray and UA.  UA was negative but the chest x-ray showed some  mild atelectasis.  Encouraged incentive spirometer, antipyretics.  He  was doing a little bit better slowly.  He was kept 1 more day.  The  sodium had already come back up by day 3.  He had an extra day of  therapy and up walking about 75, closer to 80, feet and by the following  day of November 30, 2008, the patient was doing well and ready to go home.   DISCHARGE PLAN:  1. The patient is discharged home on November 30, 2008.  2. Discharge diagnoses please see above.  3. Discharge meds Vicodin, Robaxin, Coumadin.   DIET:  As tolerated.   ACTIVITIES:  Partial weightbearing 25% to 50%.  Total hip protocol.  Hip  precautions.  Follow-up 2 weeks.   DISPOSITION:  Home.   CONDITION UPON DISCHARGE:   Improved.      Tyler [name], P.A.C.      Ollen Gross, M.D.  Electronically Signed    ALP/MEDQ  D:  12/24/2008  T:  12/24/2008  Job:  [id number]   cc:   Ollen Gross, M.D.  Fax: [phone]

## 2010-11-03 NOTE — Assessment & Plan Note (Signed)
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  He has approximate 2 year history of  treated hypertension, hypercholesterolemia, also a history of hyperuricemia.  He was last hospitalized in 2004 for TURP.  This was complicated by postop  bleeding and also apparently a pseudomonal infection.  He has a remote  history in the 80s of having melanoma resected from the left leg, two  locations.   REVIEW OF SYSTEMS:  Is negative.  Has had no GI screening.   SOCIAL HISTORY:  He is a resident of [place], recently widowed.   FAMILY HISTORY:  Father died at 47, complications of bullous pemphigus.  Mother died at 46 of probable cardiac disease.  Two of three brothers are  deceased, one with diabetes, both probably from cardiac causes.  One sister  is well.   EXAMINATION:  Revealed a well-developed, healthy appearing male in no acute  distress.  Blood pressure is 130/80.  Fundi, ear, nose and throat clear.  NECK:  No bruits.  CHEST:  Was clear.  CARDIOVASCULAR:  Normal heart sounds, they were faint.  No murmurs or  gallops.  ABDOMEN:  Soft, nontender, no organomegaly, no bruits appreciated.  EXTERNAL GENITALIA:  Unremarkable.  EXTREMITIES:  Negative with intact peripheral pulses.  RECTAL:  Revealed no abnormalities.  Stool was heme test negative.   IMPRESSION:  1. Hypertension.  2. Hypercholesterolemia.  3. History of melanoma.   DISPOSITION:  Laboratory studies and a colonoscopy will be discussed.  Return here in the spring for follow up.  Flu vaccine also discussed.  He  has had recent Pneumovax and DT vaccination.    ______________________________  Gordy Savers,  MD    PFK/MedQ  DD: 04/25/2006  DT: 04/25/2006  Job #: [id number]

## 2010-12-05 ENCOUNTER — Ambulatory Visit (INDEPENDENT_AMBULATORY_CARE_PROVIDER_SITE_OTHER): Payer: Medicare Other | Admitting: Internal Medicine

## 2010-12-05 ENCOUNTER — Encounter: Payer: Self-pay | Admitting: Internal Medicine

## 2010-12-05 ENCOUNTER — Telehealth: Payer: Self-pay | Admitting: Internal Medicine

## 2010-12-05 DIAGNOSIS — I872 Venous insufficiency (chronic) (peripheral): Secondary | ICD-10-CM

## 2010-12-05 DIAGNOSIS — I10 Essential (primary) hypertension: Secondary | ICD-10-CM

## 2010-12-05 DIAGNOSIS — I8310 Varicose veins of unspecified lower extremity with inflammation: Secondary | ICD-10-CM

## 2010-12-05 MED ORDER — TADALAFIL 20 MG PO TABS: 20.0000 mg | ORAL_TABLET | Freq: Every day | ORAL | Status: DC | PRN

## 2010-12-05 MED ORDER — TRIAMCINOLONE ACETONIDE 0.1 % EX CREA: TOPICAL_CREAM | Freq: Two times a day (BID) | CUTANEOUS | Status: DC

## 2010-12-05 NOTE — Progress Notes (Signed)
  Subjective:    Patient ID: Tyler [name], male    DOB: Jan 30, 1933, 77 y.o.   MRN: [id number]  HPI  77 year old patient who is seen today for followup. He has a history of hypertension which has been controlled on diltiazem. He presents with a three-day history of a pruritic rash involving his left anterior lower leg. He has had this same dermatitis intermittently over time. His blood pressure asthma well-controlled without diuretic therapy   Review of Systems  Constitutional: Negative for fever, chills, appetite change and fatigue.  HENT: Negative for hearing loss, ear pain, congestion, sore throat, trouble swallowing, neck stiffness, dental problem, voice change and tinnitus.   Eyes: Negative for pain, discharge and visual disturbance.  Respiratory: Negative for cough, chest tightness, wheezing and stridor.   Cardiovascular: Negative for chest pain, palpitations and leg swelling.  Gastrointestinal: Negative for nausea, vomiting, abdominal pain, diarrhea, constipation, blood in stool and abdominal distention.  Genitourinary: Negative for urgency, hematuria, flank pain, discharge, difficulty urinating and genital sores.  Musculoskeletal: Negative for myalgias, back pain, joint swelling, arthralgias and gait problem.  Skin: Positive for rash.  Neurological: Negative for dizziness, syncope, speech difficulty, weakness, numbness and headaches.  Hematological: Negative for adenopathy. Does not bruise/bleed easily.  Psychiatric/Behavioral: Negative for behavioral problems and dysphoric mood. The patient is not nervous/anxious.        Objective:   Physical Exam  Constitutional: He appears well-developed and well-nourished. No distress.       Blood pressure 124/82  Skin:       Mild stasis dermatitis changes involving both anterior lower legs distal to the knees but proximal to the ankles. This was more prevalent on the left. Skin was slightly dry and flaky          Assessment & Plan:    Hypertension well controlled Mild stasis dermatitis. He has been asked to continue to use uEucerin  moisturizing cream. If this is not effective will treat with triamcinolone 0.1% cream

## 2010-12-05 NOTE — Telephone Encounter (Signed)
Pt now has medicare, so his cpx and labs have to be done on same day in order for pts insurance to cover.

## 2010-12-05 NOTE — Telephone Encounter (Signed)
Noted - will set appt when pt in for acute this afternoon

## 2010-12-05 NOTE — Telephone Encounter (Signed)
Pt is req to get a medicare  Fasting cpx before end of July ,somewhere in between July 9 th through the 20th. Pls advise where pt could be worked in for fasting cpx.

## 2010-12-05 NOTE — Telephone Encounter (Signed)
Can this be done today? Fasting labs can be done at a later date.

## 2010-12-05 NOTE — Patient Instructions (Signed)
Apply cream twice daily Keep legs elevated as much as possible  Call or return to clinic prn if these symptoms worsen or fail to improve as anticipated.  Return in 6 months for follow-up   Limit your sodium (Salt) intake

## 2010-12-26 ENCOUNTER — Other Ambulatory Visit (INDEPENDENT_AMBULATORY_CARE_PROVIDER_SITE_OTHER): Payer: Medicare Other

## 2010-12-26 DIAGNOSIS — Z0000 Encounter for general adult medical examination without abnormal findings: Secondary | ICD-10-CM

## 2010-12-26 DIAGNOSIS — I10 Essential (primary) hypertension: Secondary | ICD-10-CM

## 2010-12-26 DIAGNOSIS — Z125 Encounter for screening for malignant neoplasm of prostate: Secondary | ICD-10-CM

## 2010-12-26 DIAGNOSIS — M109 Gout, unspecified: Secondary | ICD-10-CM

## 2010-12-26 LAB — PSA: PSA: 5.98 ng/mL — ABNORMAL HIGH (ref 0.10–4.00)

## 2010-12-26 LAB — POCT URINALYSIS DIPSTICK
Glucose, UA: NEGATIVE
Nitrite, UA: NEGATIVE
Spec Grav, UA: 1.015
Urobilinogen, UA: 0.2

## 2010-12-26 LAB — CBC WITH DIFFERENTIAL/PLATELET
Basophils Relative: 0.6 % (ref 0.0–3.0)
Eosinophils Relative: 5.2 % — ABNORMAL HIGH (ref 0.0–5.0)
HCT: 43.8 % (ref 39.0–52.0)
Hemoglobin: 14.8 g/dL (ref 13.0–17.0)
Lymphs Abs: 1.5 10*3/uL (ref 0.7–4.0)
Monocytes Relative: 8.9 % (ref 3.0–12.0)
Platelets: 177 10*3/uL (ref 150.0–400.0)
RBC: 4.69 Mil/uL (ref 4.22–5.81)
WBC: 7 10*3/uL (ref 4.5–10.5)

## 2010-12-26 LAB — BASIC METABOLIC PANEL
BUN: 26 mg/dL — ABNORMAL HIGH (ref 6–23)
Chloride: 107 mEq/L (ref 96–112)
GFR: 43.99 mL/min — ABNORMAL LOW (ref >60.00)
Potassium: 4.7 mEq/L (ref 3.5–5.1)
Sodium: 143 mEq/L (ref 135–145)

## 2010-12-26 LAB — LIPID PANEL
Cholesterol: 195 mg/dL (ref 0–200)
LDL Cholesterol: 116 mg/dL — ABNORMAL HIGH (ref 0–99)
Total CHOL/HDL Ratio: 4

## 2010-12-26 LAB — HEPATIC FUNCTION PANEL
ALT: 19 U/L (ref 0–53)
AST: 22 U/L (ref 0–37)
Total Bilirubin: 0.6 mg/dL (ref 0.3–1.2)
Total Protein: 6.5 g/dL (ref 6.0–8.3)

## 2010-12-26 LAB — TSH: TSH: 2.26 u[IU]/mL (ref 0.35–5.50)

## 2010-12-26 NOTE — Progress Notes (Signed)
Addended by: Bonnye Fava on: 12/26/2010 08:38 AM   Modules accepted: Orders

## 2011-01-02 ENCOUNTER — Ambulatory Visit (INDEPENDENT_AMBULATORY_CARE_PROVIDER_SITE_OTHER): Payer: Medicare Other | Admitting: Internal Medicine

## 2011-01-02 ENCOUNTER — Encounter: Payer: Self-pay | Admitting: Internal Medicine

## 2011-01-02 VITALS — BP 130/80 | HR 78 | Temp 98.5°F | Resp 18 | Ht 67.0 in | Wt 203.0 lb

## 2011-01-02 DIAGNOSIS — Z0000 Encounter for general adult medical examination without abnormal findings: Secondary | ICD-10-CM

## 2011-01-02 DIAGNOSIS — I10 Essential (primary) hypertension: Secondary | ICD-10-CM

## 2011-01-02 MED ORDER — VARDENAFIL HCL 20 MG PO TABS: 20.0000 mg | ORAL_TABLET | Freq: Every day | ORAL | Status: DC | PRN

## 2011-01-02 MED ORDER — DILTIAZEM HCL ER COATED BEADS 240 MG PO CP24: 240.0000 mg | ORAL_CAPSULE | Freq: Every day | ORAL | Status: DC

## 2011-01-02 MED ORDER — TRIAMCINOLONE ACETONIDE 0.1 % EX CREA: TOPICAL_CREAM | Freq: Two times a day (BID) | CUTANEOUS | Status: DC

## 2011-01-02 MED ORDER — TADALAFIL 20 MG PO TABS: 20.0000 mg | ORAL_TABLET | Freq: Every day | ORAL | Status: AC | PRN

## 2011-01-02 NOTE — Patient Instructions (Signed)
Limit your sodium (Salt) intake    It is important that you exercise regularly, at least 20 minutes 3 to 4 times per week.  If you develop chest pain or shortness of breath seek  medical attention.  You need to lose weight.  Consider a lower calorie diet and regular exercise.  Return in 6 months for follow-up   

## 2011-01-02 NOTE — Progress Notes (Signed)
  Subjective:    Patient ID: Tyler [name], male    DOB: [birth year], 77 y.o.   MRN: [id number]  HPI History of Present Illness:   77 year-old male in for an annual exam. He has treated hypertension and dyslipidemia. He has relocated from [place]. He denies any cardiopulmonary complaints. He has done well on a regimen of diltiazem and simvastatin   Current Allergies:  No known allergies   Past Medical History:  Reviewed history from 01/02/2007 and no changes required:  Hyperlipidemia  Hypertension  left hip pain  Benign prostatic hypertrophy  Erectile dysfunction Elevated PSA Hyperuricemia Chronic kidney disease (creatinine 1.6)  Past Surgical History:   Status post left total hip replacement surgery  melanoma left leg status post TURP 2004  No prior colonoscopy   Family History:  Reviewed history and no changes required:  father died age 9, history of bullous pemphigus  mother died age 66, history of psoriasis, probable cardiac death  Three brothers two deceased, cause for diabetes and coronary artery disease  one sister is well   Social History:  Widow/Widower  relocate her from [place]  Never Smoked  retired Teacher, early years/pre  Risk Factors:  Tobacco use: never   Past Medical History  Diagnosis Date  . BENIGN PROSTATIC HYPERTROPHY 07/23/2008  . 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 HIP PAIN, LEFT 07/04/2007  . HYPERLIPIDEMIA 01/02/2007  . HYPERTENSION 01/02/2007  . INSOMNIA 11/16/2008  . PROSTATE SPECIFIC ANTIGEN, ELEVATED 09/10/2008   Past Surgical History  Procedure Date  . Transurethral resection of prostate   . Total hip arthroplasty     reports that he quit smoking about 12 years ago. He has never used smokeless tobacco. He reports that he does not drink alcohol or use illicit drugs. family history is not on file. No Known Allergies    Review of Systems     Objective:   Physical Exam        Assessment & Plan:

## 2011-02-23 ENCOUNTER — Other Ambulatory Visit: Payer: Self-pay | Admitting: Internal Medicine

## 2011-02-23 NOTE — Telephone Encounter (Signed)
Pt called req refill diltiazem (CARDIZEM CD) 240 MG 24 hr capsule and [facility] on [address]  [phone]

## 2011-02-23 NOTE — Telephone Encounter (Signed)
done

## 2011-04-04 ENCOUNTER — Ambulatory Visit (INDEPENDENT_AMBULATORY_CARE_PROVIDER_SITE_OTHER): Payer: Medicare Other | Admitting: Internal Medicine

## 2011-04-04 ENCOUNTER — Encounter: Payer: Self-pay | Admitting: Internal Medicine

## 2011-04-04 DIAGNOSIS — G4700 Insomnia, unspecified: Secondary | ICD-10-CM

## 2011-04-04 DIAGNOSIS — I10 Essential (primary) hypertension: Secondary | ICD-10-CM

## 2011-04-04 DIAGNOSIS — E785 Hyperlipidemia, unspecified: Secondary | ICD-10-CM

## 2011-04-04 MED ORDER — TAMSULOSIN HCL 0.4 MG PO CAPS: 0.4000 mg | ORAL_CAPSULE | ORAL | Status: DC

## 2011-04-04 NOTE — Patient Instructions (Signed)
Moderate  Your  fluid intake and after your evening meal  Moderate your  caffeine use throughout the day  Limit your sodium (Salt) intake  Return in 6 months for follow-up

## 2011-04-04 NOTE — Progress Notes (Signed)
  Subjective:    Patient ID: Tyler [name], male    DOB: 1932/12/18, 77 y.o.   MRN: [id number]  HPI 77 year old patient who is seen today for followup. Complaints include headaches over the past 2 days they seem to be maximal in the right frontal area. His chief complaint however his insomnia he does have nocturia 3-4 times per night. He is now taking late afternoon naps. He has a history of hypertension which has been controlled   Review of Systems  Constitutional: Negative for fever, chills, appetite change and fatigue.  HENT: Negative for hearing loss, ear pain, congestion, sore throat, trouble swallowing, neck stiffness, dental problem, voice change and tinnitus.   Eyes: Negative for pain, discharge and visual disturbance.  Respiratory: Negative for cough, chest tightness, wheezing and stridor.   Cardiovascular: Negative for chest pain, palpitations and leg swelling.  Gastrointestinal: Negative for nausea, vomiting, abdominal pain, diarrhea, constipation, blood in stool and abdominal distention.  Genitourinary: Positive for frequency and difficulty urinating. Negative for urgency, hematuria, flank pain, discharge and genital sores.  Musculoskeletal: Negative for myalgias, back pain, joint swelling, arthralgias and gait problem.  Skin: Negative for rash.  Neurological: Negative for dizziness, syncope, speech difficulty, weakness, numbness and headaches.  Hematological: Negative for adenopathy. Does not bruise/bleed easily.  Psychiatric/Behavioral: Positive for sleep disturbance. Negative for behavioral problems and dysphoric mood. The patient is not nervous/anxious.        Objective:   Physical Exam  Constitutional: He is oriented to person, place, and time. He appears well-developed.  HENT:  Head: Normocephalic.  Right Ear: External ear normal.  Left Ear: External ear normal.  Eyes: Conjunctivae and EOM are normal.  Neck: Normal range of motion.  Cardiovascular: Normal rate and  normal heart sounds.   Pulmonary/Chest: Breath sounds normal.  Abdominal: Bowel sounds are normal.  Musculoskeletal: Normal range of motion. He exhibits no edema and no tenderness.  Neurological: He is alert and oriented to person, place, and time.  Psychiatric: He has a normal mood and affect. His behavior is normal.          Assessment & Plan:   Prostatism. We'll give a trial of Flomax. Hypertension stable Headaches. Will observe he has had symptoms only for 2 days and headaches do not seem to be severe or progressive. Otherwise return in 3 months for followup

## 2011-04-27 ENCOUNTER — Telehealth: Payer: Self-pay | Admitting: Internal Medicine

## 2011-04-27 DIAGNOSIS — Z1211 Encounter for screening for malignant neoplasm of colon: Secondary | ICD-10-CM

## 2011-04-27 NOTE — Telephone Encounter (Signed)
ok 

## 2011-04-27 NOTE — Telephone Encounter (Signed)
Would like a referral to have a colonoscopy with dr Kinnie Scales. Thanks.

## 2011-04-27 NOTE — Telephone Encounter (Signed)
Please advise 

## 2011-04-30 NOTE — Telephone Encounter (Signed)
Order done [facility]

## 2011-05-22 ENCOUNTER — Encounter: Payer: Self-pay | Admitting: Internal Medicine

## 2011-05-28 ENCOUNTER — Ambulatory Visit (AMBULATORY_SURGERY_CENTER): Payer: Medicare Other | Admitting: *Deleted

## 2011-05-28 VITALS — Ht 70.0 in | Wt 204.7 lb

## 2011-05-28 DIAGNOSIS — Z1211 Encounter for screening for malignant neoplasm of colon: Secondary | ICD-10-CM

## 2011-05-28 MED ORDER — PEG-KCL-NACL-NASULF-NA ASC-C 100 G PO SOLR: ORAL | Status: DC

## 2011-06-08 ENCOUNTER — Other Ambulatory Visit: Payer: Medicare Other | Admitting: Internal Medicine

## 2011-06-08 ENCOUNTER — Encounter: Payer: Self-pay | Admitting: Internal Medicine

## 2011-06-08 ENCOUNTER — Ambulatory Visit (AMBULATORY_SURGERY_CENTER): Payer: Medicare Other | Admitting: Internal Medicine

## 2011-06-08 VITALS — BP 140/90 | HR 73 | Temp 97.4°F | Resp 14 | Ht 70.0 in | Wt 204.0 lb

## 2011-06-08 DIAGNOSIS — D126 Benign neoplasm of colon, unspecified: Secondary | ICD-10-CM

## 2011-06-08 DIAGNOSIS — Z1211 Encounter for screening for malignant neoplasm of colon: Secondary | ICD-10-CM

## 2011-06-08 MED ORDER — SODIUM CHLORIDE 0.9 % IV SOLN: 500.0000 mL | INTRAVENOUS | Status: DC

## 2011-06-08 NOTE — Progress Notes (Signed)
Patient did not experience any of the following events: a burn prior to discharge; a fall within the facility; wrong site/side/patient/procedure/implant event; or a hospital transfer or hospital admission upon discharge from the facility. (G8907) Patient did not have preoperative order for IV antibiotic SSI prophylaxis. (G8918)  

## 2011-06-08 NOTE — Progress Notes (Signed)
The pt tolerated the colonoscopy very well. maw 

## 2011-06-08 NOTE — Patient Instructions (Signed)
FOLLOW THE INSTRUCTIONS ON THE GREEN AND BLUE DISCHARGE SHEETS.  CONTINUE YOUR MEDICATIONS.  AWAIT BIOPSY RESULTS.

## 2011-06-08 NOTE — Op Note (Signed)
[facility] [address] [place], [place]  [address]  COLONOSCOPY PROCEDURE REPORT  PATIENT:  Tyler, [name]  MR#:  [id number] BIRTHDATE:  December 28, 1932, 77 yrs. old  GENDER:  male ENDOSCOPIST:  Hedwig Morton. Juanda Chance, MD REF. BY:  Eleonore Chiquito, M.D. PROCEDURE DATE:  06/08/2011 PROCEDURE:  Colonoscopy with biopsy ASA CLASS:  Class II INDICATIONS:  colorectal cancer screening, average risk MEDICATIONS:   These medications were titrated to patient response per physician's verbal order, Versed 6 mg, Fentanyl 75 mcg  DESCRIPTION OF PROCEDURE:   After the risks and benefits and of the procedure were explained, informed consent was obtained. Digital rectal exam was performed and revealed no rectal masses. The [id number] endoscope was introduced through the anus and advanced to the cecum, which was identified by both the appendix and ileocecal valve.  The quality of the prep was good, using MoviPrep.  The instrument was then slowly withdrawn as the colon was fully examined. <<PROCEDUREIMAGES>>  FINDINGS:  Three polyps were found in the sigmoid colon. at 20 cm 2-3 mm polyps x3 The polyps were removed using cold biopsy forceps (see image1 and image10).  Mild diverticulosis was found in the sigmoid colon (see image8, image7, image6, and image2).  This was otherwise a normal examination of the colon (see image3, image4, image5, and image9).   Retroflexed views in the rectum revealed no abnormalities.    The scope was then withdrawn from the patient and the procedure completed.  COMPLICATIONS:  None ENDOSCOPIC IMPRESSION: 1) Three polyps in the sigmoid colon 2) Mild diverticulosis in the sigmoid colon 3) Otherwise normal examination RECOMMENDATIONS: 1) Await pathology results 2) High fiber diet.  REPEAT EXAM:  In 0 year(s) for.  no recall due to age, unless polyps are adenomatous in which case recall colon in 5 years  ______________________________ Hedwig Morton. Juanda Chance,  MD  CC:  n. eSIGNED:   Hedwig Morton. Creed Kail at 06/08/2011 03:36 PM  Aris Georgia, [phone]

## 2011-06-11 ENCOUNTER — Telehealth: Payer: Self-pay | Admitting: *Deleted

## 2011-06-11 NOTE — Telephone Encounter (Signed)
LMOM

## 2011-06-15 ENCOUNTER — Telehealth: Payer: Self-pay | Admitting: Internal Medicine

## 2011-06-15 NOTE — Telephone Encounter (Signed)
Spoke with patient and reviewed high fiber diet. Told patient the results of procedure are not back yet. He wants Dr. Juanda Chance to know he appreciated her kindness with his procedure.

## 2011-06-20 ENCOUNTER — Encounter: Payer: Self-pay | Admitting: Internal Medicine

## 2011-06-21 ENCOUNTER — Telehealth: Payer: Self-pay | Admitting: Internal Medicine

## 2011-06-21 NOTE — Telephone Encounter (Signed)
None available at this time; check back next week

## 2011-06-21 NOTE — Telephone Encounter (Signed)
Spoke with pt- informed no samples avibl. kik

## 2011-06-21 NOTE — Telephone Encounter (Signed)
Pt called and wanted to know if there are any ED Samples avail? Pt req to pick up samples today because he is leaving to go out of town tomorrow. Pls lv detailed vm if pt is not avail.

## 2011-06-21 NOTE — Telephone Encounter (Signed)
Last seen 04/2011 - please advise

## 2011-08-30 ENCOUNTER — Telehealth: Payer: Self-pay | Admitting: Internal Medicine

## 2011-08-30 NOTE — Telephone Encounter (Signed)
Patient calling re: forms to fill out for [employer]. Instructed patient to take them downstairs and we can have them filled out.

## 2011-11-16 ENCOUNTER — Encounter: Payer: Self-pay | Admitting: Internal Medicine

## 2011-11-16 ENCOUNTER — Ambulatory Visit (INDEPENDENT_AMBULATORY_CARE_PROVIDER_SITE_OTHER): Payer: Medicare Other | Admitting: Internal Medicine

## 2011-11-16 VITALS — BP 130/86 | Temp 97.6°F | Wt 202.0 lb

## 2011-11-16 DIAGNOSIS — I872 Venous insufficiency (chronic) (peripheral): Secondary | ICD-10-CM

## 2011-11-16 DIAGNOSIS — I10 Essential (primary) hypertension: Secondary | ICD-10-CM

## 2011-11-16 DIAGNOSIS — I8312 Varicose veins of left lower extremity with inflammation: Secondary | ICD-10-CM

## 2011-11-16 MED ORDER — PREDNISONE 10 MG PO TABS: 10.0000 mg | ORAL_TABLET | Freq: Two times a day (BID) | ORAL | Status: DC

## 2011-11-16 MED ORDER — DILTIAZEM HCL ER 240 MG PO CP24: 240.0000 mg | ORAL_CAPSULE | Freq: Every day | ORAL | Status: DC

## 2011-11-16 MED ORDER — TAMSULOSIN HCL 0.4 MG PO CAPS: 0.4000 mg | ORAL_CAPSULE | ORAL | Status: DC

## 2011-11-16 NOTE — Patient Instructions (Signed)
Limit your sodium (Salt) intake  Please check your blood pressure on a regular basis.  If it is consistently greater than 150/90, please make an office appointment.  Use moisturizing cream 3-4 times daily

## 2011-11-16 NOTE — Progress Notes (Signed)
  Subjective:    Patient ID: Tyler [name], male    DOB: May 19, 1933, 78 y.o.   MRN: [id number]  HPI  78 year old patient who is seen today complaining of a pruritic rash involving his left lower leg. He had a very similar rash one year ago that responded very promptly to topical steroids. He has been using triamcinolone for approximately 4-5 days and still has some pruritus.    Review of Systems  Skin: Positive for rash.       Objective:   Physical Exam  Constitutional:       Repeat blood pressure 124/80  Skin:       Examination of the left lower leg revealed some very minor stasis changes. Dry flaky skin was much more prominent involving the right lower leg compared to the left Pedal pulses were full          Assessment & Plan:   Mild stasis dermatitis. Will continue  topical triamcinolone. We'll continue with moisturizing creams We'll treat with oral prednisone for 7 days Hypertension well controlled

## 2012-04-01 ENCOUNTER — Telehealth: Payer: Self-pay | Admitting: Internal Medicine

## 2012-04-01 NOTE — Telephone Encounter (Signed)
Spoke with pt - gave dr. Nolon Bussing instructions - transferred to scheduling for CPX appt

## 2012-04-01 NOTE — Telephone Encounter (Signed)
Med question

## 2012-04-01 NOTE — Telephone Encounter (Signed)
Caller: Kyrie/Patient; Patient Name: Aris Georgia; PCP: Eleonore Chiquito [facility]); Best Callback Phone Number: [phone]; Reason for call: Rash/Hives.  Onset 03/28/12.   Very itchy, warm to touch, pale redness of the skin 6" x 8" above the ankle "and moves around" from front, side or back of the leg.  Relates he received Prednisone and Triamcinolone cream last year.  Caller relates he has severe itching that lasts for a few minutes until he applies triamcinalone cream.  Per Rash protocol,  see provider in 2 weeks; he declined sick / acute appointment. Caller asks if Dr. Lesia Hausen would approve him taking the 5 remaining Prednisone tablets and Triamcinolone cream for the current outbreak.    He also relates it is time for a check up.  Confirmed last CPE July 2012.  Caller states he will call office at a later time to schedule his well visit.  Information to office for follow up related to medication question.

## 2012-04-01 NOTE — Telephone Encounter (Signed)
Please advise 

## 2012-04-01 NOTE — Telephone Encounter (Signed)
Okay to take the remaining prednisone

## 2012-04-24 ENCOUNTER — Other Ambulatory Visit (INDEPENDENT_AMBULATORY_CARE_PROVIDER_SITE_OTHER): Payer: Medicare Other

## 2012-04-24 DIAGNOSIS — I10 Essential (primary) hypertension: Secondary | ICD-10-CM

## 2012-04-24 DIAGNOSIS — Z125 Encounter for screening for malignant neoplasm of prostate: Secondary | ICD-10-CM

## 2012-04-24 DIAGNOSIS — Z0000 Encounter for general adult medical examination without abnormal findings: Secondary | ICD-10-CM

## 2012-04-24 LAB — CBC WITH DIFFERENTIAL/PLATELET
Eosinophils Relative: 3.2 % (ref 0.0–5.0)
HCT: 44 % (ref 39.0–52.0)
Lymphs Abs: 1 10*3/uL (ref 0.7–4.0)
MCV: 92 fl (ref 78.0–100.0)
Monocytes Absolute: 0.6 10*3/uL (ref 0.1–1.0)
Platelets: 189 10*3/uL (ref 150.0–400.0)
RDW: 13.3 % (ref 11.5–14.6)

## 2012-04-24 LAB — POCT URINALYSIS DIPSTICK
Blood, UA: NEGATIVE
Glucose, UA: NEGATIVE
Nitrite, UA: NEGATIVE
Urobilinogen, UA: 0.2
pH, UA: 6.5

## 2012-04-24 LAB — HEPATIC FUNCTION PANEL
ALT: 19 U/L (ref 0–53)
Total Bilirubin: 0.8 mg/dL (ref 0.3–1.2)

## 2012-04-24 LAB — BASIC METABOLIC PANEL
Calcium: 9.1 mg/dL (ref 8.4–10.5)
GFR: 40.12 mL/min — ABNORMAL LOW (ref >60.00)
Sodium: 134 mEq/L — ABNORMAL LOW (ref 135–145)

## 2012-04-24 LAB — PSA: PSA: 5.22 ng/mL — ABNORMAL HIGH (ref 0.10–4.00)

## 2012-04-24 LAB — TSH: TSH: 1.44 u[IU]/mL (ref 0.35–5.50)

## 2012-04-24 LAB — LIPID PANEL
Total CHOL/HDL Ratio: 4
Triglycerides: 135 mg/dL (ref 0.0–149.0)

## 2012-04-30 ENCOUNTER — Encounter: Payer: Self-pay | Admitting: Internal Medicine

## 2012-05-01 ENCOUNTER — Encounter: Payer: Self-pay | Admitting: Internal Medicine

## 2012-05-01 ENCOUNTER — Ambulatory Visit (INDEPENDENT_AMBULATORY_CARE_PROVIDER_SITE_OTHER): Payer: Medicare Other | Admitting: Internal Medicine

## 2012-05-01 ENCOUNTER — Encounter: Payer: Medicare Other | Admitting: Internal Medicine

## 2012-05-01 VITALS — BP 140/86 | HR 72 | Temp 97.9°F | Resp 18 | Ht 67.0 in | Wt 201.0 lb

## 2012-05-01 DIAGNOSIS — E785 Hyperlipidemia, unspecified: Secondary | ICD-10-CM

## 2012-05-01 DIAGNOSIS — N400 Enlarged prostate without lower urinary tract symptoms: Secondary | ICD-10-CM

## 2012-05-01 DIAGNOSIS — Z0000 Encounter for general adult medical examination without abnormal findings: Secondary | ICD-10-CM

## 2012-05-01 DIAGNOSIS — R972 Elevated prostate specific antigen [PSA]: Secondary | ICD-10-CM

## 2012-05-01 DIAGNOSIS — I10 Essential (primary) hypertension: Secondary | ICD-10-CM

## 2012-05-01 MED ORDER — LISINOPRIL 10 MG PO TABS: 10.0000 mg | ORAL_TABLET | Freq: Every day | ORAL | Status: DC

## 2012-05-01 MED ORDER — TAMSULOSIN HCL 0.4 MG PO CAPS: 0.4000 mg | ORAL_CAPSULE | ORAL | Status: DC

## 2012-05-01 MED ORDER — DILTIAZEM HCL ER 240 MG PO CP24: 240.0000 mg | ORAL_CAPSULE | Freq: Every day | ORAL | Status: DC

## 2012-05-01 MED ORDER — VARDENAFIL HCL 20 MG PO TABS: 20.0000 mg | ORAL_TABLET | Freq: Every day | ORAL | Status: DC | PRN

## 2012-05-01 MED ORDER — PREDNISONE 10 MG PO TABS: 10.0000 mg | ORAL_TABLET | Freq: Two times a day (BID) | ORAL | Status: DC

## 2012-05-01 MED ORDER — TRIAMCINOLONE ACETONIDE 0.1 % EX CREA: TOPICAL_CREAM | Freq: Two times a day (BID) | CUTANEOUS | Status: DC

## 2012-05-01 NOTE — Patient Instructions (Signed)

## 2012-05-01 NOTE — Progress Notes (Signed)
Patient ID: Tyler [name], male   DOB: Dec 15, 1932, 78 y.o.   MRN: [id number]  Subjective:    Patient ID: Tyler [name], male    DOB: [birth year], 78 y.o.   MRN: [id number]  Hypertension Pertinent negatives include no chest pain, headaches, neck pain, palpitations or shortness of breath.   History of Present Illness:   78  year-old male in for an annual exam. He has treated hypertension and dyslipidemia. He has relocated from [place]. He denies any cardiopulmonary complaints. He has done well on a regimen of diltiazem and simvastatin   Current Allergies:  No known allergies   Past Medical History:  Reviewed history from 01/02/2007 and no changes required:  Hyperlipidemia  Hypertension  left hip pain  Benign prostatic hypertrophy  Erectile dysfunction Elevated PSA Hyperuricemia Chronic kidney disease (creatinine 1.6)  Past Surgical History:   Status post left total hip replacement surgery  melanoma left leg status post TURP 2004   colonoscopy  2012   Family History:  Reviewed history and no changes required:  father died age 43, history of bullous pemphigus  mother died age 30, history of psoriasis, probable cardiac death  Three brothers two deceased, cause for diabetes and coronary artery disease  one sister is well   Social History:  Widow/Widower  relocate her from [place]  Never Smoked  retired Teacher, early years/pre  Risk Factors:  Tobacco use: never   Past Medical History  Diagnosis Date  . BENIGN PROSTATIC HYPERTROPHY 07/23/2008  . 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 HIP PAIN, LEFT 07/04/2007  . HYPERLIPIDEMIA 01/02/2007  . HYPERTENSION 01/02/2007  . INSOMNIA 11/16/2008  . PROSTATE SPECIFIC ANTIGEN, ELEVATED 09/10/2008   Past Surgical History  Procedure Date  . Transurethral resection of prostate 2004  . Total hip arthroplasty 2010, 2011    2010- left; 2011- right  . Upper gastrointestinal endoscopy     reports that he quit smoking about 13 years ago. He  has never used smokeless tobacco. He reports that he does not drink alcohol or use illicit drugs. family history is negative for Colon polyps, and Stomach cancer, and Colon cancer, . No Known Allergies  1. Risk factors, based on past  M,S,F history cardiovascular risk factors include hypertension and dyslipidemia-   2.  Physical activities: Has a history of arthritis status post left hip replacement surgery no exercise limitations but does complain of mild dyspnea on exertion usually during the warm summer months and decrease in his exercise capacity   3.  Depression/mood:No history depression or mood disorder 4.  Hearing:No deficits   5.  ADL's:Independent in all aspects of daily living   6.  Fall risk:Low   7.  Home safety:No problems identified   8.  Height weight, and visual acuity;Height and weight stable no change in visual acuity   9.  Counseling:Weight loss heart healthy diet more regular exercise all recommended   10. Lab orders based on risk factors:Laboratory profile reviewed   11. Referral :Followup urology   12. Care plan:Recheck in 6 months. We'll check a followup creatinine at that time   13. Cognitive assessment: Alert and oriented normal affect. No cognitive dysfunction      Review of Systems  Constitutional: Negative for fever, chills, activity change, appetite change and fatigue.  HENT: Negative for hearing loss, ear pain, congestion, rhinorrhea, sneezing, mouth sores, trouble swallowing, neck pain, neck stiffness, dental problem, voice change, sinus pressure and tinnitus.   Eyes: Negative for photophobia, pain, redness and  visual disturbance.  Respiratory: Negative for apnea, cough, choking, chest tightness, shortness of breath and wheezing.   Cardiovascular: Negative for chest pain, palpitations and leg swelling.  Gastrointestinal: Negative for nausea, vomiting, abdominal pain, diarrhea, constipation, blood in stool, abdominal distention, anal bleeding and  rectal pain.  Genitourinary: Negative for dysuria, urgency, frequency, hematuria, flank pain, decreased urine volume, discharge, penile swelling, scrotal swelling, difficulty urinating, genital sores and testicular pain.  Musculoskeletal: Positive for arthralgias. Negative for myalgias, back pain, joint swelling and gait problem.  Skin: Negative for color change, rash and wound.  Neurological: Positive for weakness. Negative for dizziness, tremors, seizures, syncope, facial asymmetry, speech difficulty, light-headedness, numbness and headaches.  Hematological: Negative for adenopathy. Does not bruise/bleed easily.  Psychiatric/Behavioral: Negative for suicidal ideas, hallucinations, behavioral problems, confusion, sleep disturbance, self-injury, dysphoric mood, decreased concentration and agitation. The patient is not nervous/anxious.        Objective:   Physical Exam  Constitutional: He is oriented to person, place, and time. He appears well-developed.  HENT:  Head: Normocephalic.  Right Ear: External ear normal.  Left Ear: External ear normal.  Eyes: Conjunctivae normal and EOM are normal.  Neck: Normal range of motion.  Cardiovascular: Normal rate and normal heart sounds.        Dorsalis pedis pulses faint posterior tibial pulses intact  Pulmonary/Chest: Breath sounds normal.  Abdominal: Soft. Bowel sounds are normal.       Ventral/incisional hernia as well as a small umbilical hernia Bilaterally inguinal hernias left greater than the right  Genitourinary: Penis normal. No penile tenderness.  Musculoskeletal: Normal range of motion. He exhibits no edema and no tenderness.  Neurological: He is alert and oriented to person, place, and time.  Skin: Skin is dry.  Psychiatric: He has a normal mood and affect. His behavior is normal.          Assessment & Plan:  Preventive health examination. Diet weight loss more regular exercise regimen all discussed and encouraged Hypertension  stable repeat blood pressure 136/80 Mild obesity Dyslipidemia Chronic kidney disease. Will add lisinopril for better BP control and renal protection  Recheck in 6 months

## 2012-05-04 ENCOUNTER — Encounter: Payer: Self-pay | Admitting: Internal Medicine

## 2012-06-03 ENCOUNTER — Encounter: Payer: Medicare Other | Admitting: Internal Medicine

## 2012-07-18 ENCOUNTER — Encounter: Payer: Self-pay | Admitting: Family Medicine

## 2012-07-18 ENCOUNTER — Ambulatory Visit (INDEPENDENT_AMBULATORY_CARE_PROVIDER_SITE_OTHER): Payer: Medicare Other | Admitting: Family Medicine

## 2012-07-18 VITALS — BP 134/90 | HR 95 | Temp 98.5°F | Wt 199.0 lb

## 2012-07-18 DIAGNOSIS — J069 Acute upper respiratory infection, unspecified: Secondary | ICD-10-CM

## 2012-07-18 NOTE — Patient Instructions (Addendum)

## 2012-07-18 NOTE — Progress Notes (Signed)
Chief Complaint  Patient presents with  . chest congstion    cough, runny nose x few days     HPI:  Acute visit for chest congestion: -started: 1 week ago, feeling better - but just wanted to check before going on a trip -symptoms:nasal congestion, cough, scratchy throat -denies:fever, SOB, NVD, tooth pain, flu exposure -has tried: OTC cold and flu tablets -sick contacts: none known -Hx of:no hx of lung disease  ROS: See pertinent positives and negatives per HPI.  Past Medical History  Diagnosis Date  . BENIGN PROSTATIC HYPERTROPHY 07/23/2008  . 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 HIP PAIN, LEFT 07/04/2007  . HYPERLIPIDEMIA 01/02/2007  . HYPERTENSION 01/02/2007  . INSOMNIA 11/16/2008  . PROSTATE SPECIFIC ANTIGEN, ELEVATED 09/10/2008    Family History  Problem Relation Age of Onset  . Colon polyps Neg Hx   . Stomach cancer Neg Hx   . Colon cancer Neg Hx     History   Social History  . Marital Status: Widowed    Spouse Name: N/A    Number of Children: N/A  . Years of Education: N/A   Social History Main Topics  . Smoking status: Former Smoker    Quit date: 06/18/1998  . Smokeless tobacco: Never Used  . Alcohol Use: No  . Drug Use: No  . Sexually Active: None   Other Topics Concern  . None   Social History Narrative  . None    Current outpatient prescriptions:diltiazem (DILACOR XR) 240 MG 24 hr capsule, Take 1 capsule (240 mg total) by mouth daily., Disp: 90 capsule, Rfl: 3;  lisinopril (PRINIVIL,ZESTRIL) 10 MG tablet, Take 1 tablet (10 mg total) by mouth daily., Disp: 90 tablet, Rfl: 3;  Tamsulosin HCl (FLOMAX) 0.4 MG CAPS, Take 1 capsule (0.4 mg total) by mouth daily after supper., Disp: 30 capsule, Rfl: 6 triamcinolone cream (KENALOG) 0.1 %, Apply topically 2 (two) times daily., Disp: 60 g, Rfl: 2;  vardenafil (LEVITRA) 20 MG tablet, Take 1 tablet (20 mg total) by mouth daily as needed., Disp: 10 tablet, Rfl: 6;  predniSONE (DELTASONE) 10 MG tablet, Take 1  tablet (10 mg total) by mouth 2 (two) times daily., Disp: 14 tablet, Rfl: 0  EXAM:  Filed Vitals:   07/18/12 1331  BP: 134/90  Pulse: 95  Temp: 98.5 F (36.9 C)    There is no height on file to calculate BMI.  GENERAL: vitals reviewed and listed above, alert, oriented, appears well hydrated and in no acute distress  HEENT: atraumatic, conjunttiva clear, no obvious abnormalities on inspection of external nose and ears, normal appearance of ear canals and TMs, clear nasal congestion, mild post oropharyngeal erythema with PND, no tonsillar edema or exudate, no sinus TTP  NECK: no obvious masses on inspection  LUNGS: clear to auscultation bilaterally, no wheezes, rales or rhonchi, good air movement  CV: HRRR, no peripheral edema  MS: moves all extremities without noticeable abnormality  PSYCH: pleasant and cooperative, no obvious depression or anxiety  ASSESSMENT AND PLAN:  Discussed the following assessment and plan:  1. Upper respiratory infection    -feeling better and likely an VURI - supportive care and return precuations -Patient advised to return or notify a doctor immediately if symptoms worsen or persist or new concerns arise.  Patient Instructions  INSTRUCTIONS FOR UPPER RESPIRATORY INFECTION:  -plenty of rest and fluids  -nasal saline wash 2-3 times daily (use prepackaged nasal saline or bottled/distilled water if making your own)   -can use  sinex or afrin nasal spray for drainage and nasal congestion - but do NOT use longer then 3-4 days  -can use tylenol or ibuprofen as directed for aches and sorethroat  -in the winter time, using a humidifier at night is helpful (please follow cleaning instructions)  -if you are taking a cough medication - use only as directed, may also try a teaspoon of honey to coat the throat and throat lozenges  -for sore throat, salt water gargles can help  -follow up if you have fevers, facial pain, tooth pain, difficulty  breathing or are worsening or not getting better in 5-7 days       Earl Losee R.

## 2012-08-02 ENCOUNTER — Other Ambulatory Visit: Payer: Self-pay

## 2012-11-05 ENCOUNTER — Ambulatory Visit (INDEPENDENT_AMBULATORY_CARE_PROVIDER_SITE_OTHER): Payer: Medicare Other | Admitting: Internal Medicine

## 2012-11-05 ENCOUNTER — Encounter: Payer: Self-pay | Admitting: Internal Medicine

## 2012-11-05 VITALS — BP 160/90 | HR 89 | Temp 97.6°F | Resp 20 | Wt 191.0 lb

## 2012-11-05 DIAGNOSIS — E785 Hyperlipidemia, unspecified: Secondary | ICD-10-CM

## 2012-11-05 DIAGNOSIS — J069 Acute upper respiratory infection, unspecified: Secondary | ICD-10-CM

## 2012-11-05 DIAGNOSIS — N400 Enlarged prostate without lower urinary tract symptoms: Secondary | ICD-10-CM

## 2012-11-05 DIAGNOSIS — G4700 Insomnia, unspecified: Secondary | ICD-10-CM

## 2012-11-05 NOTE — Progress Notes (Signed)
Subjective:    Patient ID: Tyler [name], male    DOB: [birth year], 79 y.o.   MRN: [id number]  HPI 79 year old patient who is seen today for followup. He presents with a 2 to three-day history of increasing cough and chest congestion. He has been seen by urology recently due 2 obstructive urinary tract symptoms as well as some hematuria. He is now on finasteride as well as Flomax. No fever or purulent sputum production. In general doing fairly well. He has a history of treated hypertension and dyslipidemia.  Past Medical History  Diagnosis Date  . BENIGN PROSTATIC HYPERTROPHY 07/23/2008  . 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 HIP PAIN, LEFT 07/04/2007  . HYPERLIPIDEMIA 01/02/2007  . HYPERTENSION 01/02/2007  . INSOMNIA 11/16/2008  . PROSTATE SPECIFIC ANTIGEN, ELEVATED 09/10/2008    History   Social History  . Marital Status: Widowed    Spouse Name: N/A    Number of Children: N/A  . Years of Education: N/A   Occupational History  . Not on file.   Social History Main Topics  . Smoking status: Former Smoker    Quit date: 06/18/1998  . Smokeless tobacco: Never Used  . Alcohol Use: No  . Drug Use: No  . Sexually Active: Not on file   Other Topics Concern  . Not on file   Social History Narrative  . No narrative on file    Past Surgical History  Procedure Laterality Date  . Transurethral resection of prostate  2004  . Total hip arthroplasty  2010, 2011    2010- left; 2011- right  . Upper gastrointestinal endoscopy      Family History  Problem Relation Age of Onset  . Colon polyps Neg Hx   . Stomach cancer Neg Hx   . Colon cancer Neg Hx     No Known Allergies  Current Outpatient Prescriptions on File Prior to Visit  Medication Sig Dispense Refill  . diltiazem (DILACOR XR) 240 MG 24 hr capsule Take 1 capsule (240 mg total) by mouth daily.  90 capsule  3  . lisinopril (PRINIVIL,ZESTRIL) 10 MG tablet Take 1 tablet (10 mg total) by mouth daily.  90 tablet  3  .  Tamsulosin HCl (FLOMAX) 0.4 MG CAPS Take 1 capsule (0.4 mg total) by mouth daily after supper.  30 capsule  6  . triamcinolone cream (KENALOG) 0.1 % Apply topically 2 (two) times daily.  60 g  2  . vardenafil (LEVITRA) 20 MG tablet Take 1 tablet (20 mg total) by mouth daily as needed.  10 tablet  6   No current facility-administered medications on file prior to visit.    BP 160/90  Pulse 89  Temp(Src) 97.6 F (36.4 C) (Oral)  Resp 20  Wt 191 lb (86.637 kg)  BMI 29.91 kg/m2  SpO2 94%       Review of Systems  Constitutional: Negative for fever, chills, appetite change and fatigue.  HENT: Positive for congestion and postnasal drip. Negative for hearing loss, ear pain, sore throat, trouble swallowing, neck stiffness, dental problem, voice change and tinnitus.   Eyes: Negative for pain, discharge and visual disturbance.  Respiratory: Positive for cough. Negative for chest tightness, wheezing and stridor.   Cardiovascular: Negative for chest pain, palpitations and leg swelling.  Gastrointestinal: Negative for nausea, vomiting, abdominal pain, diarrhea, constipation, blood in stool and abdominal distention.  Genitourinary: Positive for urgency, frequency, hematuria and decreased urine volume. Negative for flank pain, discharge, difficulty urinating and genital sores.  Musculoskeletal: Negative for myalgias, back pain, joint swelling, arthralgias and gait problem.  Skin: Negative for rash.  Neurological: Negative for dizziness, syncope, speech difficulty, weakness, numbness and headaches.  Hematological: Negative for adenopathy. Does not bruise/bleed easily.  Psychiatric/Behavioral: Negative for behavioral problems and dysphoric mood. The patient is not nervous/anxious.        Objective:   Physical Exam  Constitutional: He is oriented to person, place, and time. He appears well-developed.  HENT:  Head: Normocephalic.  Right Ear: External ear normal.  Left Ear: External ear normal.   Eyes: Conjunctivae and EOM are normal.  Neck: Normal range of motion.  Cardiovascular: Normal rate and normal heart sounds.   Pulmonary/Chest: Breath sounds normal.  Abdominal: Bowel sounds are normal.  Musculoskeletal: Normal range of motion. He exhibits no edema and no tenderness.  Neurological: He is alert and oriented to person, place, and time.  Psychiatric: He has a normal mood and affect. His behavior is normal.          Assessment & Plan:   Viral URI with cough. We'll treat symptomatically with Mucinex DM BPH. Continue Flomax and finasteride  CPX 6 months

## 2012-11-05 NOTE — Patient Instructions (Addendum)
Acute bronchitis symptoms for less than 10 days are generally not helped by antibiotics.  Take over-the-counter expectorants and cough medications such as  Mucinex DM.  Call if there is no improvement in 5 to 7 days or if he developed worsening cough, fever, or new symptoms, such as shortness of breath or chest pain.  Limit your sodium (Salt) intake

## 2012-11-27 ENCOUNTER — Other Ambulatory Visit: Payer: Self-pay | Admitting: Dermatology

## 2012-12-01 ENCOUNTER — Telehealth: Payer: Self-pay | Admitting: Internal Medicine

## 2012-12-01 NOTE — Telephone Encounter (Signed)
Left message on voicemail to call office.  

## 2012-12-01 NOTE — Telephone Encounter (Signed)
Pt made own appointment to see Dr. Delford Field for SOB.  However he can not be seen until 12/11/12.  Pt requesting a referral from Dr. Kirtland Bouchard in hopes of getting an earlier appointment with any pulmonologist in the area.

## 2012-12-02 NOTE — Telephone Encounter (Signed)
Pt returned your call. Will be at [phone] for 30 min or so. OK to leave message. Pt said ANY pulmonologist ....as long as its this week.

## 2012-12-02 NOTE — Telephone Encounter (Signed)
Called pt back told him we could not get him seen any sooner that he is lucky to be being seen next week. Pt verbalized understanding.

## 2012-12-08 ENCOUNTER — Ambulatory Visit (INDEPENDENT_AMBULATORY_CARE_PROVIDER_SITE_OTHER)
Admission: RE | Admit: 2012-12-08 | Discharge: 2012-12-08 | Disposition: A | Discharge status: Home / Self Care | Payer: Medicare Other | Source: Ambulatory Visit | Attending: Pulmonary Disease | Admitting: Pulmonary Disease

## 2012-12-08 ENCOUNTER — Ambulatory Visit (INDEPENDENT_AMBULATORY_CARE_PROVIDER_SITE_OTHER): Payer: Medicare Other | Admitting: Pulmonary Disease

## 2012-12-08 ENCOUNTER — Encounter: Payer: Self-pay | Admitting: Pulmonary Disease

## 2012-12-08 VITALS — BP 130/80 | HR 93 | Temp 97.3°F | Ht 68.5 in | Wt 193.0 lb

## 2012-12-08 DIAGNOSIS — R0989 Other specified symptoms and signs involving the circulatory and respiratory systems: Secondary | ICD-10-CM

## 2012-12-08 DIAGNOSIS — R0609 Other forms of dyspnea: Secondary | ICD-10-CM

## 2012-12-08 DIAGNOSIS — R0600 Dyspnea, unspecified: Secondary | ICD-10-CM | POA: Insufficient documentation

## 2012-12-08 DIAGNOSIS — J449 Chronic obstructive pulmonary disease, unspecified: Secondary | ICD-10-CM

## 2012-12-08 NOTE — Progress Notes (Signed)
Subjective:    Patient ID: Tyler [name], male    DOB: 1933-05-20, 79 y.o.   MRN: [id number]  HPI  This is a very pleasant 79 year old male from [place] who comes her clinic today for 6 weeks or more of shortness of breath, cough, and wheezing. He states that the symptoms developed insidiously initially with shortness of breath. This is lead to increased mucus production which is typically clear. He has been coughing spells which last throughout the day. He has not been treated recently with antibiotics or steroids. He denies recent episodes of an upper respiratory infection. He denies chest pain aside from soreness associated with coughing. He previously smoked one to one half pack of cigarettes daily for 30-40 years. He quit 15 years ago he was told "he had only 50% lung function". Despite this, he has never been diagnosed with COPD. He denies recent leg swelling, fever, or chills. He does note that he feels somewhat cold and how sometimes. He has no prior known lung problems. He's never been prescribed inhalers to his knowledge. He has not had a chest x-ray in over 3 years.   Past Medical History  Diagnosis Date  . BENIGN PROSTATIC HYPERTROPHY 07/23/2008  . 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 HIP PAIN, LEFT 07/04/2007  . HYPERLIPIDEMIA 01/02/2007  . HYPERTENSION 01/02/2007  . INSOMNIA 11/16/2008  . PROSTATE SPECIFIC ANTIGEN, ELEVATED 09/10/2008     Family History  Problem Relation Age of Onset  . Colon polyps Neg Hx   . Stomach cancer Neg Hx   . Colon cancer Neg Hx      History   Social History  . Marital Status: Widowed    Spouse Name: N/A    Number of Children: N/A  . Years of Education: N/A   Occupational History  . Retired Teacher, early years/pre    Social History Main Topics  . Smoking status: Former Smoker -- 1.00 packs/day for 40 years    Types: Cigarettes    Quit date: 06/18/1998  . Smokeless tobacco: Never Used  . Alcohol Use: No  . Drug Use: No  . Sexually Active: Not  on file   Other Topics Concern  . Not on file   Social History Narrative  . No narrative on file     Allergies  Allergen Reactions  . Vicodin (Hydrocodone-Acetaminophen)     hallucinations     Outpatient Prescriptions Prior to Visit  Medication Sig Dispense Refill  . diltiazem (DILACOR XR) 240 MG 24 hr capsule Take 1 capsule (240 mg total) by mouth daily.  90 capsule  3  . finasteride (PROSCAR) 5 MG tablet Take 5 mg by mouth daily.      . Tamsulosin HCl (FLOMAX) 0.4 MG CAPS Take 1 capsule (0.4 mg total) by mouth daily after supper.  30 capsule  6  . lisinopril (PRINIVIL,ZESTRIL) 10 MG tablet Take 1 tablet (10 mg total) by mouth daily.  90 tablet  3  . triamcinolone cream (KENALOG) 0.1 % Apply topically 2 (two) times daily.  60 g  2  . vardenafil (LEVITRA) 20 MG tablet Take 1 tablet (20 mg total) by mouth daily as needed.  10 tablet  6   No facility-administered medications prior to visit.       Review of Systems  Constitutional: Positive for appetite change. Negative for fever, chills and activity change.  HENT: Positive for congestion and sneezing. Negative for hearing loss, ear pain, rhinorrhea, neck pain, neck stiffness, postnasal drip and sinus pressure.  Eyes: Negative for redness, itching and visual disturbance.  Respiratory: Positive for cough and shortness of breath. Negative for chest tightness and wheezing.   Cardiovascular: Negative for chest pain, palpitations and leg swelling.  Gastrointestinal: Negative for nausea, vomiting, abdominal pain, diarrhea, constipation, blood in stool and abdominal distention.  Musculoskeletal: Positive for arthralgias. Negative for myalgias, joint swelling and gait problem.  Skin: Negative for rash.  Neurological: Negative for dizziness, light-headedness, numbness and headaches.  Hematological: Does not bruise/bleed easily.  Psychiatric/Behavioral: Negative for confusion and dysphoric mood.       Objective:   Physical  Exam  Filed Vitals:   12/08/12 1129  BP: 130/80  Pulse: 93  Temp: 97.3 F (36.3 C)  TempSrc: Oral  Height: 5' 8.5" (1.74 m)  Weight: 193 lb (87.544 kg)  SpO2: 95%   Gen: well appearing, no acute distress HEENT: NCAT, PERRL, EOMi, OP clear, neck supple without masses PULM: CTA B CV: RRR, systolic murmur RUSB, no JVD AB: BS+, soft, nontender, no hsm Ext: warm, no edema, no clubbing, no cyanosis Derm: no rash or skin breakdown Neuro: A&Ox4, CN II-XII intact, strength 5/5 in all 4 extremities       Assessment & Plan:   COPD, moderate Tyler [name] has COPD based on simple spirometry performed today. This is do to his prior years of cigarette smoking.  COPD: GOLD Stage 4, Grade B  Combined recommendations from the [employer]	[place], [employer], Designer, television/film set, European Respiratory Society (Qaseem A et al, Ann Intern Med. 2011;155(3):179) recommends tobacco cessation, pulmonary rehab (for symptomatic patients with an FEV1 < 50% predicted), supplemental oxygen (for patients with SaO2 <88% or paO2 <55), and appropriate bronchodilator therapy.  In regards to long acting bronchodilators, they recommend monotherapy (FEV1 60-80% with symptoms weak evidence, FEV1 with symptoms <60% strong evidence), or combination therapy (FEV1 <60% with symptoms, strong recommendation, moderate evidence).  One should also provide patients with annual immunizations and consider therapy for prevention of COPD exacerbations (ie. roflumilast or azithromycin) when appopriate.  -O2 therapy: Not indicated -Immunizations: Review pneumonia on next visit -Tobacco use: Quit 15 years ago -Exercise: Encouraged regular exercise -Bronchodilator therapy: Start spiriva -Exacerbation prevention: spiriva   Dyspnea We diagnosed COPD today. This is most likely etiology of his shortness of breath. However he does have a systolic murmur over his aortic valve so we will get an  echocardiogram to make sure he does not have aortic stenosis.   Updated Medication List Outpatient Encounter Prescriptions as of 12/08/2012  Medication Sig Dispense Refill  . diltiazem (DILACOR XR) 240 MG 24 hr capsule Take 1 capsule (240 mg total) by mouth daily.  90 capsule  3  . finasteride (PROSCAR) 5 MG tablet Take 5 mg by mouth daily.      Marland Kitchen guaiFENesin (MUCINEX) 600 MG 12 hr tablet Take 600 mg by mouth 2 (two) times daily as needed for congestion.      Marland Kitchen loratadine (CLARITIN) 10 MG tablet Take 10 mg by mouth daily.      . Tamsulosin HCl (FLOMAX) 0.4 MG CAPS Take 1 capsule (0.4 mg total) by mouth daily after supper.  30 capsule  6  . lisinopril (PRINIVIL,ZESTRIL) 10 MG tablet Take 1 tablet (10 mg total) by mouth daily.  90 tablet  3  . [DISCONTINUED] triamcinolone cream (KENALOG) 0.1 % Apply topically 2 (two) times daily.  60 g  2  . [DISCONTINUED] vardenafil (LEVITRA) 20 MG tablet Take 1 tablet (20 mg  total) by mouth daily as needed.  10 tablet  6   No facility-administered encounter medications on file as of 12/08/2012.

## 2012-12-08 NOTE — Patient Instructions (Addendum)
Take the spiriva once a day no matter how you feel  We will call you with the results of your Chest X-ray and echocardiogram  Use Zyrtec once a day and nasacort 2 puffs each nostril daily for the sinus congestion  WE will see you back in 4-6 weeks or sooner if needed

## 2012-12-08 NOTE — Assessment & Plan Note (Signed)
Mr. Tyler [name] has COPD based on simple spirometry performed today. This is do to his prior years of cigarette smoking.  COPD: GOLD Stage 4, Grade B  Combined recommendations from the [employer]	[place], [employer], Designer, television/film set, European Respiratory Society (Qaseem A et al, Ann Intern Med. 2011;155(3):179) recommends tobacco cessation, pulmonary rehab (for symptomatic patients with an FEV1 < 50% predicted), supplemental oxygen (for patients with SaO2 <88% or paO2 <55), and appropriate bronchodilator therapy.  In regards to long acting bronchodilators, they recommend monotherapy (FEV1 60-80% with symptoms weak evidence, FEV1 with symptoms <60% strong evidence), or combination therapy (FEV1 <60% with symptoms, strong recommendation, moderate evidence).  One should also provide patients with annual immunizations and consider therapy for prevention of COPD exacerbations (ie. roflumilast or azithromycin) when appopriate.  -O2 therapy: Not indicated -Immunizations: Review pneumonia on next visit -Tobacco use: Quit 15 years ago -Exercise: Encouraged regular exercise -Bronchodilator therapy: Start spiriva -Exacerbation prevention: spiriva

## 2012-12-08 NOTE — Assessment & Plan Note (Signed)
We diagnosed COPD today. This is most likely etiology of his shortness of breath. However he does have a systolic murmur over his aortic valve so we will get an echocardiogram to make sure he does not have aortic stenosis.

## 2012-12-09 ENCOUNTER — Other Ambulatory Visit: Payer: Self-pay | Admitting: Pulmonary Disease

## 2012-12-09 ENCOUNTER — Telehealth: Payer: Self-pay | Admitting: Pulmonary Disease

## 2012-12-09 DIAGNOSIS — J449 Chronic obstructive pulmonary disease, unspecified: Secondary | ICD-10-CM

## 2012-12-09 DIAGNOSIS — R918 Other nonspecific abnormal finding of lung field: Secondary | ICD-10-CM

## 2012-12-09 MED ORDER — FLUTICASONE PROPIONATE 50 MCG/ACT NA SUSP: 2.0000 | Freq: Every day | NASAL | Status: DC

## 2012-12-09 MED ORDER — TIOTROPIUM BROMIDE MONOHYDRATE 18 MCG IN CAPS: 18.0000 ug | ORAL_CAPSULE | Freq: Every day | RESPIRATORY_TRACT | Status: DC

## 2012-12-09 NOTE — Telephone Encounter (Signed)
I called Tyler [name] to discuss the results of his CXR which showed a left lung mass.  He understands that he needs a CT scan to take a better look.  We will schedule this for him and call him with a time.

## 2012-12-10 ENCOUNTER — Other Ambulatory Visit (INDEPENDENT_AMBULATORY_CARE_PROVIDER_SITE_OTHER): Payer: Medicare Other

## 2012-12-10 DIAGNOSIS — J449 Chronic obstructive pulmonary disease, unspecified: Secondary | ICD-10-CM

## 2012-12-10 LAB — BASIC METABOLIC PANEL
BUN: 21 mg/dL (ref 6–23)
CO2: 25 mEq/L (ref 19–32)
Calcium: 9.5 mg/dL (ref 8.4–10.5)
Chloride: 95 mEq/L — ABNORMAL LOW (ref 96–112)
Creatinine, Ser: 1.7 mg/dL — ABNORMAL HIGH (ref 0.4–1.5)

## 2012-12-11 ENCOUNTER — Institutional Professional Consult (permissible substitution): Payer: Medicare Other | Admitting: Critical Care Medicine

## 2012-12-11 ENCOUNTER — Telehealth: Payer: Self-pay | Admitting: Pulmonary Disease

## 2012-12-11 ENCOUNTER — Ambulatory Visit (INDEPENDENT_AMBULATORY_CARE_PROVIDER_SITE_OTHER)
Admission: RE | Admit: 2012-12-11 | Discharge: 2012-12-11 | Disposition: A | Discharge status: Home / Self Care | Payer: Medicare Other | Source: Ambulatory Visit | Attending: Pulmonary Disease | Admitting: Pulmonary Disease

## 2012-12-11 ENCOUNTER — Ambulatory Visit (HOSPITAL_COMMUNITY): Payer: Medicare Other | Attending: Pulmonary Disease

## 2012-12-11 DIAGNOSIS — I10 Essential (primary) hypertension: Secondary | ICD-10-CM | POA: Insufficient documentation

## 2012-12-11 DIAGNOSIS — R918 Other nonspecific abnormal finding of lung field: Secondary | ICD-10-CM

## 2012-12-11 DIAGNOSIS — R590 Localized enlarged lymph nodes: Secondary | ICD-10-CM

## 2012-12-11 DIAGNOSIS — Z87891 Personal history of nicotine dependence: Secondary | ICD-10-CM | POA: Insufficient documentation

## 2012-12-11 DIAGNOSIS — J449 Chronic obstructive pulmonary disease, unspecified: Secondary | ICD-10-CM | POA: Insufficient documentation

## 2012-12-11 DIAGNOSIS — I313 Pericardial effusion (noninflammatory): Secondary | ICD-10-CM

## 2012-12-11 DIAGNOSIS — R0602 Shortness of breath: Secondary | ICD-10-CM | POA: Insufficient documentation

## 2012-12-11 DIAGNOSIS — E785 Hyperlipidemia, unspecified: Secondary | ICD-10-CM | POA: Insufficient documentation

## 2012-12-11 DIAGNOSIS — R011 Cardiac murmur, unspecified: Secondary | ICD-10-CM

## 2012-12-11 DIAGNOSIS — J4489 Other specified chronic obstructive pulmonary disease: Secondary | ICD-10-CM | POA: Insufficient documentation

## 2012-12-11 DIAGNOSIS — R0600 Dyspnea, unspecified: Secondary | ICD-10-CM

## 2012-12-11 DIAGNOSIS — R222 Localized swelling, mass and lump, trunk: Secondary | ICD-10-CM

## 2012-12-11 MED ORDER — ALPRAZOLAM 0.5 MG PO TABS: 0.5000 mg | ORAL_TABLET | Freq: Once | ORAL | Status: DC | PRN

## 2012-12-11 MED ORDER — IOHEXOL 300 MG/ML  SOLN
65.0000 mL | Freq: Once | INTRAMUSCULAR | Status: AC | PRN
  Administered 2012-12-11: 65 mL via INTRAVENOUS

## 2012-12-11 NOTE — Progress Notes (Signed)
Echocardiogram performed.  

## 2012-12-11 NOTE — Telephone Encounter (Signed)
I called Tyler [name] to discuss the results of his CT scan of his chest which is very worrisome for metastatic lung cancer.  I explained to him that the next step is a PET CT followed by a likely supraclavicular lymph node biopsy.  I have called in Xanax for him to use for the PET scan (0.5mg  po, dispense 2 pills) as he was very anxious today.  I will cc triage on this message for the following: -order a PET CT ASAP: reason lung mass -order an ENT consult ASAP for supraclavicular lymph node biopsy > possible lung cancer  [name] PCCM Pager: [phone] Cell: [phone] If no response, call [phone]

## 2012-12-11 NOTE — Telephone Encounter (Signed)
I read the echocardiogram report showing a moderate amount of fluid around his heart with RA collapse but preserved RV function.    I called back Tyler [name] to discuss this but unfortunately he was not answering his phone.    I will cc triage so that he also have an outpatient cardiology consult ASAP to evaluate this pericardial effusion.  [name] PCCM Pager: [phone] Cell: [phone] If no response, call [phone]

## 2012-12-12 NOTE — Telephone Encounter (Signed)
I have called the pt to let him know I am placing these orders and also according to other phone note I will order stat cards eval as well  The pt voiced understanding Orders were sent to [facility] and I made [facility] aware all are STAT

## 2012-12-12 NOTE — Addendum Note (Signed)
Addended by: Christen Butter on: 12/12/2012 09:03 AM   Modules accepted: Orders

## 2012-12-15 ENCOUNTER — Telehealth: Payer: Self-pay | Admitting: Pulmonary Disease

## 2012-12-15 NOTE — Telephone Encounter (Signed)
Dr. Lazarus Salines called to discuss pt.  He is not able to palpate supraclavicular node on exam.  He will arrange for IR to assess.  Pt is scheduled for PET scan on 7/03.  Will forward message to Dr. Kendrick Fries.

## 2012-12-16 ENCOUNTER — Telehealth: Payer: Self-pay | Admitting: Pulmonary Disease

## 2012-12-16 MED ORDER — ALPRAZOLAM 0.5 MG PO TABS: 0.5000 mg | ORAL_TABLET | Freq: Once | ORAL | Status: DC | PRN

## 2012-12-16 NOTE — Telephone Encounter (Signed)
Rx has been called in for the pt. He is aware. Nothing further was needed.

## 2012-12-16 NOTE — Telephone Encounter (Signed)
Dr. Malachy Chamber called out to the [facility] office to speak Dr. Kendrick Fries in regards to this pt. He is not a physician of the pt's but a good friend. I advised Dr. Malachy Chamber that since he is not a physician that is treating the pt we would need consent from the pt to speak to him about the pt's care. He understood and agreed. Dr. Malachy Chamber requests that we call the pt and get the okay, the pt was the one wanting Dr. Malachy Chamber to speak to Dr. Kendrick Fries about his care.  Spoke to the pt. He states that Dr. Malachy Chamber is in fact one of his great friends. Pt gave verbal consent for Dr. Kendrick Fries to speak to Dr. Malachy Chamber. I am going to forward this message to Dr. Kendrick Fries to address. I am also going to verbally tell Dr. Kendrick Fries as well.

## 2012-12-17 ENCOUNTER — Encounter: Payer: Self-pay | Admitting: Cardiovascular Disease

## 2012-12-17 ENCOUNTER — Ambulatory Visit (INDEPENDENT_AMBULATORY_CARE_PROVIDER_SITE_OTHER): Payer: Medicare Other | Admitting: Cardiovascular Disease

## 2012-12-17 VITALS — BP 146/86 | HR 87 | Ht 68.5 in | Wt 190.0 lb

## 2012-12-17 DIAGNOSIS — I313 Pericardial effusion (noninflammatory): Secondary | ICD-10-CM

## 2012-12-17 DIAGNOSIS — I319 Disease of pericardium, unspecified: Secondary | ICD-10-CM

## 2012-12-17 NOTE — Progress Notes (Signed)
Tyler [name] Date of Birth  10-24-1932       [facility]    [place] [address]  [address] [place], [place]  [address]   [place], [place]  [address] [phone]     [phone]   Fax  [phone]    Fax [phone]  Problem List: 1. Possible lung cancer 2. Pericardial effusion. 3. COPD 4.    History of Present Illness:  Tyler [name] is a 79 yo who was send over for evaluation of a pericardial effusion.  .he has had a cough for several weeks.  He has had some hoarsness of his voice.  A CXR revealed a suspicious lesion.  A follow up CT revealed a probable lung cancer as well as a pericardial effusion.  A follow up echo confirmed the presence of a moderate pericardial effusion.    Current Outpatient Prescriptions on File Prior to Visit  Medication Sig Dispense Refill  . ALPRAZolam (XANAX) 0.5 MG tablet Take 1 tablet (0.5 mg total) by mouth once as needed for anxiety (to use for PET scan).  2 tablet  0  . diltiazem (DILACOR XR) 240 MG 24 hr capsule Take 1 capsule (240 mg total) by mouth daily.  90 capsule  3  . finasteride (PROSCAR) 5 MG tablet Take 5 mg by mouth daily.      . fluticasone (FLONASE) 50 MCG/ACT nasal spray Place 2 sprays into the nose daily.  16 g  2  . loratadine (CLARITIN) 10 MG tablet Take 10 mg by mouth daily.      . Tamsulosin HCl (FLOMAX) 0.4 MG CAPS Take 1 capsule (0.4 mg total) by mouth daily after supper.  30 capsule  6  . tiotropium (SPIRIVA HANDIHALER) 18 MCG inhalation capsule Place 1 capsule (18 mcg total) into inhaler and inhale daily.  90 capsule  3  . guaiFENesin (MUCINEX) 600 MG 12 hr tablet Take 600 mg by mouth 2 (two) times daily as needed for congestion.       No current facility-administered medications on file prior to visit.    Allergies  Allergen Reactions  . Vicodin (Hydrocodone-Acetaminophen)     hallucinations    Past Medical History  Diagnosis Date  . BENIGN PROSTATIC HYPERTROPHY  07/23/2008  . 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 HIP PAIN, LEFT 07/04/2007  . HYPERLIPIDEMIA 01/02/2007  . HYPERTENSION 01/02/2007  . INSOMNIA 11/16/2008  . PROSTATE SPECIFIC ANTIGEN, ELEVATED 09/10/2008    Past Surgical History  Procedure Laterality Date  . Transurethral resection of prostate  2004  . Total hip arthroplasty  2010, 2011    2010- left; 2011- right  . Upper gastrointestinal endoscopy      History  Smoking status  . Former Smoker -- 1.00 packs/day for 40 years  . Types: Cigarettes  . Quit date: 06/18/1998  Smokeless tobacco  . Never Used    History  Alcohol Use No    Family History  Problem Relation Age of Onset  . Colon polyps Neg Hx   . Stomach cancer Neg Hx   . Colon cancer Neg Hx     Reviw of Systems:  Reviewed in the HPI.  All other systems are negative.  Physical Exam: Blood pressure 146/86, pulse 87, height 5' 8.5" (1.74 m), weight 190 lb (86.183 kg), SpO2 94.00%. General: Well developed, well nourished, in no acute distress. Hoarse voice.    Head: Normocephalic, atraumatic, sclera non-icteric, mucus membranes are moist,  Neck: Supple. Carotids are 2 + without bruits. No JVD   Lungs: few rhonchi  Heart: RR, 2/6 systolic murmur.  Abdomen: Soft, non-tender, non-distended with normal bowel sounds.  Msk:  Strength and tone are normal   Extremities: No clubbing or cyanosis.    Distal pedal pulses are 2+ and equal    Neuro: CN II - XII intact.  Alert and oriented X 3.   Psych:  Normal   ECG: 05/01/12:  NSR.    Assessment / Plan:

## 2012-12-17 NOTE — Assessment & Plan Note (Signed)
Tyler [name] presents today for further evaluation of a pericardial effusion.  There is no evidence of pericardial benign.  Tyler [name] has had progressive shortness of breath and coughing for the past several weeks. He's had a chest x-ray which reveals a left-sided lung nodule. A CT scan confirmed the presence of a solitary nodule in his left upper lobe as well as multiple lymph nodes and pulmonary mets.   I suspect that this is a metastatic pericardial effusion.  He does not have any evidence of pericardial tamponade so there is no urgent indication for pericardiocentesis or pericardial window.  He is scheduled for a PET scan tomorrow. I think the main emphasis here needs to be diagnosis of this lung mass. A CT scan interpretation suggest that this is most likely a bronchogenic carcinoma.  At this time there is no evidence either clinically or echocardiographically of pericardial tamponade. We discussed the symptoms of pericardial tamponade including shortness of breath, hypertension/dizziness, and tachycardia. He'll call me if he develops any of these symptoms.    We'll see him on an as-needed basis.

## 2012-12-17 NOTE — Patient Instructions (Addendum)
Your physician recommends that you schedule a follow-up appointment in: AS NEEDED  Your physician recommends that you continue on your current medications as directed. Please refer to the Current Medication list given to you today.  

## 2012-12-18 ENCOUNTER — Telehealth: Payer: Self-pay | Admitting: Pulmonary Disease

## 2012-12-18 ENCOUNTER — Encounter (HOSPITAL_COMMUNITY)
Admission: RE | Admit: 2012-12-18 | Discharge: 2012-12-18 | Disposition: A | Discharge status: Home / Self Care | Payer: Medicare Other | Source: Ambulatory Visit | Attending: Pulmonary Disease | Admitting: Pulmonary Disease

## 2012-12-18 ENCOUNTER — Encounter (HOSPITAL_COMMUNITY): Payer: Self-pay

## 2012-12-18 DIAGNOSIS — I714 Abdominal aortic aneurysm, without rupture, unspecified: Secondary | ICD-10-CM | POA: Insufficient documentation

## 2012-12-18 DIAGNOSIS — K4020 Bilateral inguinal hernia, without obstruction or gangrene, not specified as recurrent: Secondary | ICD-10-CM | POA: Insufficient documentation

## 2012-12-18 DIAGNOSIS — I319 Disease of pericardium, unspecified: Secondary | ICD-10-CM | POA: Insufficient documentation

## 2012-12-18 DIAGNOSIS — R599 Enlarged lymph nodes, unspecified: Secondary | ICD-10-CM | POA: Insufficient documentation

## 2012-12-18 DIAGNOSIS — R222 Localized swelling, mass and lump, trunk: Secondary | ICD-10-CM | POA: Insufficient documentation

## 2012-12-18 DIAGNOSIS — R918 Other nonspecific abnormal finding of lung field: Secondary | ICD-10-CM | POA: Insufficient documentation

## 2012-12-18 DIAGNOSIS — C3492 Malignant neoplasm of unspecified part of left bronchus or lung: Secondary | ICD-10-CM

## 2012-12-18 MED ORDER — FLUDEOXYGLUCOSE F - 18 (FDG) INJECTION
19.6000 | Freq: Once | INTRAVENOUS | Status: AC | PRN
  Administered 2012-12-18: 19.6 via INTRAVENOUS

## 2012-12-18 NOTE — Telephone Encounter (Signed)
I called Mr. Hale to let him know the results of the PET scan and that the next step is a needle biopsy of the supraclavicular lymph node via interventional radiology.  He voiced understanding of the plan.  I will make sure that he has an appointment with IR and I will place a referral to thoracic oncology.  I let him know that he can contact me for questions, or his friend Dr. Arta Bruce can contact me on his behalf if needed.  [name] PCCM Pager: [phone] Cell: [phone] If no response, call [phone]

## 2012-12-22 ENCOUNTER — Telehealth: Payer: Self-pay | Admitting: Pulmonary Disease

## 2012-12-22 DIAGNOSIS — C7802 Secondary malignant neoplasm of left lung: Secondary | ICD-10-CM

## 2012-12-22 NOTE — Telephone Encounter (Signed)
Spoke to pt. He was under the impression that we were going to be getting him set up for an appointment with IR for a needle aspiration of a lymph node. BQ did send me a message about seeing if he had been set up for this yet, ENT supposedly to do this.  Samples will be placed up front for him to pick up at the [place] office.  BQ - please advise if we need to set this up for him or does Dr. Lazarus Salines need to do this.

## 2012-12-23 ENCOUNTER — Ambulatory Visit (HOSPITAL_BASED_OUTPATIENT_CLINIC_OR_DEPARTMENT_OTHER): Payer: Medicare Other

## 2012-12-23 ENCOUNTER — Ambulatory Visit (HOSPITAL_BASED_OUTPATIENT_CLINIC_OR_DEPARTMENT_OTHER): Payer: Medicare Other | Admitting: Internal Medicine

## 2012-12-23 ENCOUNTER — Encounter: Payer: Self-pay | Admitting: Internal Medicine

## 2012-12-23 ENCOUNTER — Other Ambulatory Visit (HOSPITAL_BASED_OUTPATIENT_CLINIC_OR_DEPARTMENT_OTHER): Payer: Medicare Other | Admitting: Pulmonary Disease

## 2012-12-23 ENCOUNTER — Telehealth: Payer: Self-pay | Admitting: Internal Medicine

## 2012-12-23 ENCOUNTER — Telehealth: Payer: Self-pay | Admitting: Pulmonary Disease

## 2012-12-23 ENCOUNTER — Other Ambulatory Visit (HOSPITAL_BASED_OUTPATIENT_CLINIC_OR_DEPARTMENT_OTHER): Payer: Medicare Other | Admitting: Lab

## 2012-12-23 VITALS — BP 161/107 | HR 89 | Temp 97.5°F | Resp 18 | Ht 68.5 in | Wt 188.9 lb

## 2012-12-23 DIAGNOSIS — R918 Other nonspecific abnormal finding of lung field: Secondary | ICD-10-CM

## 2012-12-23 DIAGNOSIS — R222 Localized swelling, mass and lump, trunk: Secondary | ICD-10-CM

## 2012-12-23 DIAGNOSIS — C3490 Malignant neoplasm of unspecified part of unspecified bronchus or lung: Secondary | ICD-10-CM

## 2012-12-23 DIAGNOSIS — C3402 Malignant neoplasm of left main bronchus: Secondary | ICD-10-CM

## 2012-12-23 LAB — CBC WITH DIFFERENTIAL/PLATELET
BASO%: 0.5 % (ref 0.0–2.0)
EOS%: 2.9 % (ref 0.0–7.0)
HCT: 42.7 % (ref 38.4–49.9)
LYMPH%: 10.5 % — ABNORMAL LOW (ref 14.0–49.0)
MCH: 29.7 pg (ref 27.2–33.4)
MCHC: 34 g/dL (ref 32.0–36.0)
MONO%: 9.2 % (ref 0.0–14.0)
NEUT%: 76.9 % — ABNORMAL HIGH (ref 39.0–75.0)
Platelets: 244 10*3/uL (ref 140–400)

## 2012-12-23 LAB — COMPREHENSIVE METABOLIC PANEL (CC13)
ALT: 15 U/L (ref 0–55)
AST: 13 U/L (ref 5–34)
Alkaline Phosphatase: 58 U/L (ref 40–150)
Creatinine: 1.6 mg/dL — ABNORMAL HIGH (ref 0.7–1.3)
Total Bilirubin: 0.48 mg/dL (ref 0.20–1.20)

## 2012-12-23 NOTE — Patient Instructions (Signed)
You have a questionable metastatic lung cancer. I will complete the staging workup during CT of the head. I will order the ultrasound guided core biopsy of one of the supraclavicular lymph node. Followup visit in one week.

## 2012-12-23 NOTE — Telephone Encounter (Signed)
Spoke to pt. He is aware that BQ will place the order for the referral.  Order has been placed. Nothing further is needed.

## 2012-12-23 NOTE — Telephone Encounter (Signed)
Gave pt appt for MD visit next week

## 2012-12-23 NOTE — Progress Notes (Signed)
[facility] Telephone:([phone]) [phone]   Fax:([phone]) [phone]  CONSULT NOTE  REFERRING PHYSICIAN: Dr. Max Fickle  REASON FOR CONSULTATION:  79 years old white male with questionable metastatic lung cancer.  HPI Tyler [name] is a 79 y.o. male with past medical history significant for hypertension, benign prostatic hypertrophy, history of skin cancer and long history of smoking but quit 15 years ago. For several months the patient has been complaining of cough and, shortness of breath as well as fatigue and tightness in his chest. He was referred to Dr. Kendrick Fries for evaluation of his questionable seasonal allergy. Chest x-ray was performed on 12/08/2012 and showed questionable nodular lesion anteriorly overlying the left suprahilar region worrisome for malignancy. This was followed by CT scan of the chest on 12/11/2012 and it showed Innumerable bilateral pulmonary nodules, most consistent with metastatic disease. Index cavitary right middle lobe or right lower lobe nodule measures 1.1 cm. A right lower lobe nodule measures 8 mm.Within the medial left upper lobe is a spiculated mass which measures 3.2 x 2.7 cm consistent with primary bronchogenic carcinoma. A PET scan was performed on 12/18/2012 and it showed Left upper lobe mass with extensive hypermetabolic supraclavicular/mediastinal adenopathy, bilateral pulmonary nodules (some of which are hypermetabolic) and a hypermetabolic nodular appearing left adrenal gland. Collectively, findings are most consistent with stage IV primary bronchogenic carcinoma.  The patient was referred to me today for further evaluation and recommendation regarding his condition. He continues to complain of increasing fatigue and weakness as well as shortness of breath and cough but no hemoptysis. He lost around 20 pounds in the last few months. He denied any headache or visual changes.  The patient is a widow and he was accompanied today by his significant  other [name] and his daughter Janann August. He is a retired Teacher, early years/pre. He has a history of smoking one pack per day for around 42 years but quit 15 years ago, no history of alcohol or drug abuse.    @SFHPI @  Past Medical History  Diagnosis Date  . BENIGN PROSTATIC HYPERTROPHY 07/23/2008  . 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 HIP PAIN, LEFT 07/04/2007  . HYPERLIPIDEMIA 01/02/2007  . HYPERTENSION 01/02/2007  . INSOMNIA 11/16/2008  . PROSTATE SPECIFIC ANTIGEN, ELEVATED 09/10/2008    Past Surgical History  Procedure Laterality Date  . Transurethral resection of prostate  2004  . Total hip arthroplasty  2010, 2011    2010- left; 2011- right  . Upper gastrointestinal endoscopy      Family History  Problem Relation Age of Onset  . Colon polyps Neg Hx   . Stomach cancer Neg Hx   . Colon cancer Neg Hx     Social History History  Substance Use Topics  . Smoking status: Former Smoker -- 1.00 packs/day for 40 years    Types: Cigarettes    Quit date: 06/18/1998  . Smokeless tobacco: Never Used  . Alcohol Use: No    Allergies  Allergen Reactions  . Vicodin (Hydrocodone-Acetaminophen)     hallucinations    Current Outpatient Prescriptions  Medication Sig Dispense Refill  . cholecalciferol (VITAMIN D) 1000 UNITS tablet Take 1,000 Units by mouth daily.      Marland Kitchen diltiazem (DILACOR XR) 240 MG 24 hr capsule Take 1 capsule (240 mg total) by mouth daily.  90 capsule  3  . finasteride (PROSCAR) 5 MG tablet Take 5 mg by mouth daily.      Marland Kitchen guaiFENesin (MUCINEX) 600 MG 12 hr tablet  Take 600 mg by mouth 2 (two) times daily as needed for congestion.      Marland Kitchen loratadine (CLARITIN) 10 MG tablet Take 10 mg by mouth daily.      . multivitamin-iron-minerals-folic acid (CENTRUM) chewable tablet Chew 1 tablet by mouth daily.      . polyethylene glycol (MIRALAX / GLYCOLAX) packet Take 17 g by mouth daily.      . Tamsulosin HCl (FLOMAX) 0.4 MG CAPS Take 1 capsule (0.4 mg total) by mouth daily after  supper.  30 capsule  6  . tiotropium (SPIRIVA HANDIHALER) 18 MCG inhalation capsule Place 1 capsule (18 mcg total) into inhaler and inhale daily.  90 capsule  3  . ALPRAZolam (XANAX) 0.5 MG tablet Take 1 tablet (0.5 mg total) by mouth once as needed for anxiety (to use for PET scan).  2 tablet  0  . lisinopril (PRINIVIL,ZESTRIL) 10 MG tablet Take 10 mg by mouth as needed.       No current facility-administered medications for this visit.    Review of Systems  A comprehensive review of systems was negative except for: Constitutional: positive for fatigue and weight loss Ears, nose, mouth, throat, and face: positive for hoarseness Respiratory: positive for cough and dyspnea on exertion  Physical Exam  ZOX:WRUEA, healthy, no distress, well nourished and well developed SKIN: skin color, texture, turgor are normal HEAD: Normocephalic, No masses, lesions, tenderness or abnormalities EYES: normal, PERRLA EARS: External ears normal OROPHARYNX:no exudate and no erythema  NECK: supple, no adenopathy LYMPH:  no palpable lymphadenopathy, no hepatosplenomegaly LUNGS: clear to auscultation  HEART: regular rate & rhythm and no murmurs ABDOMEN:abdomen soft, non-tender, normal bowel sounds and no masses or organomegaly BACK: Back symmetric, no curvature. EXTREMITIES:no joint deformities, effusion, or inflammation, no edema  NEURO: alert & oriented x 3 with fluent speech, no focal motor/sensory deficits  PERFORMANCE STATUS: ECOG 1  LABORATORY DATA: Lab Results  Component Value Date   WBC 9.1 12/23/2012   HGB 14.5 12/23/2012   HCT 42.7 12/23/2012   MCV 87.4 12/23/2012   PLT 244 12/23/2012      Chemistry      Component Value Date/Time   NA 131* 12/23/2012 1542   NA 130* 12/10/2012 1308   K 4.3 12/23/2012 1542   K 4.5 12/10/2012 1308   CL 95* 12/10/2012 1308   CO2 28 12/23/2012 1542   CO2 25 12/10/2012 1308   BUN 23.0 12/23/2012 1542   BUN 21 12/10/2012 1308   CREATININE 1.6* 12/23/2012 1542   CREATININE  1.7* 12/10/2012 1308      Component Value Date/Time   CALCIUM 9.4 12/23/2012 1542   CALCIUM 9.5 12/10/2012 1308   ALKPHOS 58 12/23/2012 1542   ALKPHOS 39 04/24/2012 0918   AST 13 12/23/2012 1542   AST 16 04/24/2012 0918   ALT 15 12/23/2012 1542   ALT 19 04/24/2012 0918   BILITOT 0.48 12/23/2012 1542   BILITOT 0.8 04/24/2012 0918       RADIOGRAPHIC STUDIES: Dg Chest 2 View  12/08/2012   *RADIOLOGY REPORT*  Clinical Data: Cough, shortness of breath, former smoking history  CHEST - 2 VIEW  Comparison: Portable chest x-ray of 11/28/2008  Findings: There is suspicion of a nodular lesion anteriorly on the lateral view near the retrosternal air space possibly overlying the left suprahilar region on the frontal view, worrisome for malignancy.  In view of the patient's smoking history and symptoms, CT of the chest with IV contrast media is recommended to  assess further.  There is slight elevation of the left hemidiaphragm as well.  The right lung is clear.  The heart is mildly enlarged. There are degenerative changes throughout the thoracic spine.  IMPRESSION:  1.  Question of a nodular lesion anteriorly possibly overlying the left suprahilar region worrisome for malignancy.  Recommend CT of the chest with IV contrast media. 2.  Elevation of the left hemidiaphragm. 3.  Cardiomegaly.   Original Report Authenticated By: Dwyane Dee, M.D.   Ct Chest W Contrast  12/11/2012   *RADIOLOGY REPORT*  Clinical Data: Possible left lung mass on chest radiograph. Shortness of breath.  Cough.  COPD.  CT CHEST WITH CONTRAST  Technique:  Multidetector CT imaging of the chest was performed following the standard protocol during bolus administration of intravenous contrast.  Contrast: 65mL OMNIPAQUE IOHEXOL 300 MG/ML  SOLN  Comparison: Plain film 12/08/2012  Findings: Lungs/pleura: Mild motion degradation throughout. Repeated with repeated with moderate success.   Left hemidiaphragm elevation.  Moderate centrilobular emphysema.    Innumerable bilateral pulmonary nodules, most consistent with metastatic disease.  Index cavitary right middle lobe or right lower lobe nodule measures 1.1 cm on image 41/series 3. A right lower lobe nodule measures 8 mm on image 38/series 3.  Within the medial left upper lobe is a spiculated mass which measures 3.2 x 2.7 cm on image 18/series 3, consistent with primary bronchogenic carcinoma.  No pleural fluid.  Heart/Mediastinum: Possible left supraclavicular soft tissue fullness posterior to the left jugular vein on image 1/series 2.  Tortuous thoracic aorta.  Mild cardiomegaly with a moderate pericardial effusion. Multivessel coronary artery atherosclerosis. Aortic valvular calcifications.  Right paratracheal adenopathy which measures 2.4 x 2.0 cm on image 15/series 4.  Azygo-esophageal recess node which measures 1.7 cm on image 32/series 4. Localized nodal metastasis within a left suprahilar / lateral AP window region.  Nodal mass measures 4.4 x 1.6 cm on image 20/series 4.  There is adjacent prevascular adenopathy at 1.4 cm on image 17/series 4.  Upper abdomen: Mild motion degradation continuing into the upper abdomen.  Incompletely imaged left renal cysts.  Possible left renal calculi. Normal adrenal glands.  Bones/Musculoskeletal:  No acute osseous abnormality.  IMPRESSION:  1.  Findings most consistent with metastatic primary bronchogenic carcinoma within the left upper lobe.  Dominant lung mass with bilateral thoracic adenopathy and extensive pulmonary nodules/metastasis. 2.  Mild motion degraded exam. 3.  Cardiomegaly with coronary artery atherosclerosis and moderate pericardial effusion. 4.  Cannot exclude adenopathy in the left supraclavicular region. If the patient undergoes PET, recommend attention to this area. 5.  Possible left nephrolithiasis.   Original Report Authenticated By: Jeronimo Greaves, M.D.   Nm Pet Image Initial (pi) Skull Base To Thigh  12/18/2012   *RADIOLOGY REPORT*  Clinical Data:  Initial treatment strategy for lung mass.  NUCLEAR MEDICINE PET SKULL BASE TO THIGH  Fasting Blood Glucose:  101  Technique:  19.6 mCi F-18 FDG was injected intravenously. CT data was obtained and used for attenuation correction and anatomic localization only.  (This was not acquired as a diagnostic CT examination.) Additional exam technical data entered on technologist worksheet.  Comparison:  CT chest 12/11/2012.  Findings:  Neck: No hypermetabolic lymph nodes in the neck.  CT images show no acute findings.  Chest:  Hypermetabolic bilateral supraclavicular adenopathy with an S U V max of 14.0 on the right.  Hypermetabolic adenopathy extends into the mediastinum bilaterally.  There are two somewhat ill- defined nodal lesions in  the AP window which collectively, have an S U V max 21.2.  Hypermetabolic adenopathy extends inferiorly into the subcarinal station.  Spiculated left upper lobe mass measures 2.5 x 3.5 cm and has an S U V max of 17.0.  A 10 mm nodule is seen in the posterior aspect of the left upper lobe, with an S U V max of 3.2.  There are additional scattered pulmonary nodules bilaterally, some of which do show mild hypermetabolism.  No additional areas of abnormal hypermetabolism in the chest. Moderate pericardial effusion persists.  No pleural fluid.  Abdomen/Pelvis:  The left adrenal gland may be minimally nodular in appearance and is hypermetabolic, with an S U V max of 9.7.  No additional areas of abnormal hypermetabolism in the abdomen or pelvis.  CT images show no acute findings.  Left renal stone or renal vascular calcification.  Infrarenal aortic aneurysm measures 7.1 x 7.5 cm and ends above the bifurcation.  There are bilateral inguinal hernias, containing fat on the right and unobstructed colon on the left.  Skeleton:  No focal hypermetabolic activity to suggest skeletal metastasis.  IMPRESSION:  1.  Left upper lobe mass with extensive hypermetabolic supraclavicular/mediastinal adenopathy,  bilateral pulmonary nodules (some of which are hypermetabolic) and a hypermetabolic nodular appearing left adrenal gland.  Collectively, findings are most consistent with stage IV primary bronchogenic carcinoma. 2.  Large infrarenal aortic aneurysm. 3.  Moderate pericardial effusion, stable.   Original Report Authenticated By: Leanna Battles, M.D.    ASSESSMENT: This is a very pleasant 79 years old white male with highly suspicious metastatic lung cancer with bilateral pulmonary nodules in addition to bilateral supraclavicular and mediastinal lymphadenopathy as well as left adrenal mass.   PLAN: I have a lengthy discussion with the patient and his family today about his condition. I showed them the images of the PET scan. I recommended for the patient to have an ultrasound guided biopsy of one of the left or right supraclavicular lymph node by interventional radiology. I will also complete the staging workup I ordered during CT of the head to rule out brain metastasis. I would see the patient back for followup visit in one week for reevaluation and more detailed discussion of his treatment options based on the final pathology. I gave the patient and his family the time to ask questions and I answered them completely to their satisfaction.  All questions were answered. The patient knows to call the clinic with any problems, questions or concerns. We can certainly see the patient much sooner if necessary.  Thank you so much for allowing me to participate in the care of Tyler [name]. I will continue to follow up the patient with you and assist in his care.  I spent 45 minutes counseling the patient face to face. The total time spent in the appointment was 60 minutes.   Mylene Bow K. 12/23/2012, 5:22 PM

## 2012-12-23 NOTE — Telephone Encounter (Signed)
Spoke with Crystal and Lillia Abed, this needs to be put in as an order so when scheduled they can see and can link. This is put in as a referral. Lillia Abed says she will put in

## 2012-12-23 NOTE — Telephone Encounter (Signed)
Called, spoke with pt.  Pt states he was calling back, after speaking with Lillia Abed today, just to let BQ know that he is scheduled to see Dr. Arville Go today.  Advised I would let BQ know this.  I also inquired if he was calling to check on the status of the bx.  Pt states she wasn't calling for this reason.  However, I will send msg to [facility] as well to ensure follow up on bx.    PCCs, order was placed for bx under IR.  Pls advise when scheduled.  Thank you.

## 2012-12-23 NOTE — Telephone Encounter (Signed)
Let's order it.  VS said that [facility] was going to do it.  I don't see an order for it anywhere, so we need to order it for him.    IR guided needle aspiration of supraclavicular nodes; diagnosis: metastatic lung cancer

## 2012-12-24 ENCOUNTER — Other Ambulatory Visit: Payer: Self-pay | Admitting: Medical Oncology

## 2012-12-24 ENCOUNTER — Telehealth: Payer: Self-pay | Admitting: Medical Oncology

## 2012-12-24 ENCOUNTER — Other Ambulatory Visit: Payer: Self-pay | Admitting: Radiology

## 2012-12-24 ENCOUNTER — Telehealth: Payer: Self-pay | Admitting: Pulmonary Disease

## 2012-12-24 ENCOUNTER — Other Ambulatory Visit: Payer: Self-pay | Admitting: Pulmonary Disease

## 2012-12-24 DIAGNOSIS — C3490 Malignant neoplasm of unspecified part of unspecified bronchus or lung: Secondary | ICD-10-CM

## 2012-12-24 NOTE — Telephone Encounter (Signed)
LMOAM for pt to return my call in regards to appointment. Tyler [name]

## 2012-12-24 NOTE — Telephone Encounter (Signed)
Order has been placed and is in radiologist review. Rhonda J Cobb

## 2012-12-24 NOTE — Telephone Encounter (Signed)
Biopsy has been scheduled or Thurs 12/25/12 at 2:00 at [facility]. Tyler [name]

## 2012-12-24 NOTE — Addendum Note (Signed)
Addended by: Si Gaul on: 12/24/2012 10:33 AM   Modules accepted: Orders

## 2012-12-24 NOTE — Telephone Encounter (Signed)
See my other note.  Feel free to call me if there is ever a time sensitive issue (like this).    [phone]

## 2012-12-24 NOTE — Telephone Encounter (Signed)
Dr. Fredia Sorrow called to speak to Dr. Kendrick Fries in regards to this pt. An order was placed for a needle biopsy of his lung. Dr. Fredia Sorrow states that the pt would benefit from a ultrasound guided superclavicular lymph node biopsy instead. He is wanting to know if BQ would be okay with this.  Dr. Kendrick Fries is on night float this evening. I am going to send this message to his inbox and give him a call around 3pm to let him know about this situation. (per PW advice)

## 2012-12-24 NOTE — Progress Notes (Signed)
Confirmed appts . 

## 2012-12-24 NOTE — Telephone Encounter (Signed)
Asking about CT Biopsy and wants it same days as CT head. Order sent to scheduling.

## 2012-12-24 NOTE — Telephone Encounter (Signed)
Yes, order a supraclavicular lymph node biopsy.  Thanks  Per my note yesterday:  "Let's order it. VS said that [facility] was going to do it. I don't see an order for it anywhere, so we need to order it for him.  IR guided needle aspiration of supraclavicular nodes; diagnosis: metastatic lung cancer"

## 2012-12-25 ENCOUNTER — Ambulatory Visit (HOSPITAL_COMMUNITY)
Admission: RE | Admit: 2012-12-25 | Discharge: 2012-12-25 | Disposition: A | Discharge status: Home / Self Care | Payer: Medicare Other | Source: Ambulatory Visit | Attending: Internal Medicine | Admitting: Internal Medicine

## 2012-12-25 ENCOUNTER — Ambulatory Visit (HOSPITAL_COMMUNITY): Payer: Medicare Other

## 2012-12-25 ENCOUNTER — Encounter (HOSPITAL_COMMUNITY): Payer: Self-pay

## 2012-12-25 ENCOUNTER — Telehealth: Payer: Self-pay | Admitting: Medical Oncology

## 2012-12-25 DIAGNOSIS — N400 Enlarged prostate without lower urinary tract symptoms: Secondary | ICD-10-CM | POA: Insufficient documentation

## 2012-12-25 DIAGNOSIS — I10 Essential (primary) hypertension: Secondary | ICD-10-CM | POA: Insufficient documentation

## 2012-12-25 DIAGNOSIS — R918 Other nonspecific abnormal finding of lung field: Secondary | ICD-10-CM

## 2012-12-25 DIAGNOSIS — Z96649 Presence of unspecified artificial hip joint: Secondary | ICD-10-CM | POA: Insufficient documentation

## 2012-12-25 DIAGNOSIS — R222 Localized swelling, mass and lump, trunk: Secondary | ICD-10-CM | POA: Insufficient documentation

## 2012-12-25 DIAGNOSIS — I672 Cerebral atherosclerosis: Secondary | ICD-10-CM | POA: Insufficient documentation

## 2012-12-25 DIAGNOSIS — G4730 Sleep apnea, unspecified: Secondary | ICD-10-CM | POA: Insufficient documentation

## 2012-12-25 DIAGNOSIS — E785 Hyperlipidemia, unspecified: Secondary | ICD-10-CM | POA: Insufficient documentation

## 2012-12-25 DIAGNOSIS — R599 Enlarged lymph nodes, unspecified: Secondary | ICD-10-CM | POA: Insufficient documentation

## 2012-12-25 DIAGNOSIS — Z87891 Personal history of nicotine dependence: Secondary | ICD-10-CM | POA: Insufficient documentation

## 2012-12-25 LAB — PROTIME-INR
INR: 1.06 (ref 0.00–1.49)
Prothrombin Time: 13.6 seconds (ref 11.6–15.2)

## 2012-12-25 LAB — CBC
Platelets: 217 10*3/uL (ref 150–400)
RBC: 4.98 MIL/uL (ref 4.22–5.81)
RDW: 13.2 % (ref 11.5–15.5)
WBC: 8.1 10*3/uL (ref 4.0–10.5)

## 2012-12-25 LAB — APTT: aPTT: 36 seconds (ref 24–37)

## 2012-12-25 MED ORDER — MIDAZOLAM HCL 2 MG/2ML IJ SOLN
INTRAMUSCULAR | Status: AC | PRN
  Administered 2012-12-25 (×3): 0.5 mg via INTRAVENOUS

## 2012-12-25 MED ORDER — FENTANYL CITRATE 0.05 MG/ML IJ SOLN
INTRAMUSCULAR | Status: AC
  Filled 2012-12-25: qty 2

## 2012-12-25 MED ORDER — MIDAZOLAM HCL 2 MG/2ML IJ SOLN
INTRAMUSCULAR | Status: AC
  Filled 2012-12-25: qty 2

## 2012-12-25 MED ORDER — FENTANYL CITRATE 0.05 MG/ML IJ SOLN
INTRAMUSCULAR | Status: AC | PRN
  Administered 2012-12-25 (×2): 25 ug via INTRAVENOUS

## 2012-12-25 MED ORDER — SODIUM CHLORIDE 0.9 % IV SOLN: Freq: Once | INTRAVENOUS | Status: DC

## 2012-12-25 MED ORDER — IOHEXOL 300 MG/ML  SOLN
80.0000 mL | Freq: Once | INTRAMUSCULAR | Status: AC | PRN
  Administered 2012-12-25: 80 mL via INTRAVENOUS

## 2012-12-25 NOTE — Telephone Encounter (Signed)
Request to change order to CT head with and without contrast.. Okay to change per their protocol.

## 2012-12-25 NOTE — Telephone Encounter (Signed)
While working on this issue yesterday (12/24/12) Rhonda let me know that the pt had an appointment with Dr. Shirline Frees yesterday and he ordered this ultrasound for the pt while he was in the office.  Looking in his chart, I believe that this was going to be done today.

## 2012-12-25 NOTE — Telephone Encounter (Signed)
Spoke with patient this morning and he is aware of biopsy at [facility]. Rhonda J Cobb

## 2012-12-25 NOTE — H&P (Signed)
Tyler [name] is an 79 y.o. male.   Chief Complaint: pt developed shortness of breath 3 weeks ago Work up revealed lung mass Then + PET supraclavicular nodes and lung mass Scheduled now for [facility] biopsy HPI: recent dx "early melanoma"; BPH; HTN; HLD  Past Medical History  Diagnosis Date  . BENIGN PROSTATIC HYPERTROPHY 07/23/2008  . 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 HIP PAIN, LEFT 07/04/2007  . HYPERLIPIDEMIA 01/02/2007  . HYPERTENSION 01/02/2007  . INSOMNIA 11/16/2008  . PROSTATE SPECIFIC ANTIGEN, ELEVATED 09/10/2008    Past Surgical History  Procedure Laterality Date  . Transurethral resection of prostate  2004  . Total hip arthroplasty  2010, 2011    2010- left; 2011- right  . Upper gastrointestinal endoscopy      Family History  Problem Relation Age of Onset  . Colon polyps Neg Hx   . Stomach cancer Neg Hx   . Colon cancer Neg Hx    Social History:  reports that he quit smoking about 14 years ago. His smoking use included Cigarettes. He has a 40 pack-year smoking history. He has never used smokeless tobacco. He reports that he does not drink alcohol or use illicit drugs.  Allergies:  Allergies  Allergen Reactions  . Vicodin (Hydrocodone-Acetaminophen)     hallucinations     (Not in a hospital admission)  Results for orders placed during the hospital encounter of 12/25/12 (from the past 48 hour(s))  CBC     Status: None   Collection Time    12/25/12 12:14 PM      Result Value Range   WBC 8.1  4.0 - 10.5 K/uL   RBC 4.98  4.22 - 5.81 MIL/uL   Hemoglobin 14.7  13.0 - 17.0 g/dL   HCT 04.5  40.9 - 81.1 %   MCV 85.3  78.0 - 100.0 fL   MCH 29.5  26.0 - 34.0 pg   MCHC 34.6  30.0 - 36.0 g/dL   RDW 91.4  78.2 - 95.6 %   Platelets 217  150 - 400 K/uL   No results found.  Review of Systems  Constitutional: Negative for fever.  Respiratory: Positive for shortness of breath.   Cardiovascular: Negative for chest pain.  Gastrointestinal: Negative for nausea, vomiting  and abdominal pain.  Neurological: Positive for weakness. Negative for headaches.    There were no vitals taken for this visit. Physical Exam  Constitutional: He is oriented to person, place, and time. He appears well-nourished.  Cardiovascular: Normal rate, regular rhythm and normal heart sounds.   No murmur heard. Respiratory: Effort normal and breath sounds normal. He has no wheezes.  GI: Soft. Bowel sounds are normal. There is no tenderness.  Musculoskeletal: Normal range of motion.  weak  Neurological: He is alert and oriented to person, place, and time.  Skin: Skin is warm and dry.  Psychiatric: He has a normal mood and affect. His behavior is normal. Judgment and thought content normal.     Assessment/Plan Hx SOB CT and +PET lung mass and SCLN Scheduled now for [facility] LN bx Pt aware of procedure benefits and risks and agreeable to proceed Consent signed and in chart  Drayven Marchena A 12/25/2012, 12:45 PM

## 2012-12-25 NOTE — Procedures (Signed)
US guided core biopsies of right supraclavicular lymph nodes.  4 cores obtained.  No immediate complication.

## 2012-12-26 ENCOUNTER — Encounter (HOSPITAL_COMMUNITY): Payer: Self-pay | Admitting: Anesthesiology

## 2012-12-26 ENCOUNTER — Encounter: Payer: Self-pay | Admitting: *Deleted

## 2012-12-26 NOTE — Progress Notes (Signed)
Patient screened by Distress Thermometer.  Patient scored a 3 on thermometer, no referrals made at this time. 

## 2012-12-28 ENCOUNTER — Other Ambulatory Visit: Payer: Self-pay | Admitting: Internal Medicine

## 2012-12-30 ENCOUNTER — Other Ambulatory Visit (HOSPITAL_COMMUNITY): Payer: Medicare Other

## 2012-12-31 ENCOUNTER — Ambulatory Visit: Payer: Medicare Other | Admitting: Cardiovascular Disease

## 2012-12-31 ENCOUNTER — Telehealth: Payer: Self-pay | Admitting: Internal Medicine

## 2012-12-31 ENCOUNTER — Ambulatory Visit (HOSPITAL_BASED_OUTPATIENT_CLINIC_OR_DEPARTMENT_OTHER): Payer: Medicare Other | Admitting: Internal Medicine

## 2012-12-31 ENCOUNTER — Encounter: Payer: Self-pay | Admitting: Internal Medicine

## 2012-12-31 DIAGNOSIS — C3490 Malignant neoplasm of unspecified part of unspecified bronchus or lung: Secondary | ICD-10-CM

## 2012-12-31 NOTE — Progress Notes (Signed)
[name] [phone]   [phone]  OFFICE PROGRESS NOTE  Tyler Boga, MD [address] [place] [address]  DIAGNOSIS: Metastatic non-small cell lung cancer, adenocarcinoma presented with left upper lobe lung mass in addition to extensive supraclavicular, mediastinal adenopathy and bilateral pulmonary nodules diagnosed in July of 2014. EGFR mutation as well as ALK gene translocation are still pending  PRIOR THERAPY: None  CURRENT THERAPY: None  INTERVAL HISTORY: Tyler [name] 79 y.o. male returns to the clinic today for followup visit accompanied by his daughter and girlfriend. The patient is feeling fine today except for mild fatigue as well as hoarseness of his voice and shortness of breath with minimal exertion. He denied having any significant weight loss or night sweats. The patient denied having any cough or hemoptysis. He had CT scan of the head performed recently that showed no evidence for metastatic disease to the brain. He also had ultrasound guided biopsy of the right supraclavicular lymph node by interventional radiology and the final pathology was consistent with metastatic adenocarcinoma of lung primary. The tissue blocks were sent for EGFR mutation as well as ALK gene translocation but the results are still pending. He is here today for evaluation and discussion of his treatment options.  MEDICAL HISTORY: Past Medical History  Diagnosis Date  . BENIGN PROSTATIC HYPERTROPHY 07/23/2008  . 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 HIP PAIN, LEFT 07/04/2007  . HYPERLIPIDEMIA 01/02/2007  . HYPERTENSION 01/02/2007  . INSOMNIA 11/16/2008  . PROSTATE SPECIFIC ANTIGEN, ELEVATED 09/10/2008    ALLERGIES:  is allergic to vicodin.  MEDICATIONS:  Current Outpatient Prescriptions  Medication Sig Dispense Refill  . cholecalciferol (VITAMIN D) 1000 UNITS tablet Take 1,000 Units by mouth daily.      Marland Kitchen diltiazem (DILACOR XR) 240  MG 24 hr capsule TAKE ONE CAPSULE BY MOUTH EVERY DAY  90 capsule  1  . guaiFENesin (MUCINEX) 600 MG 12 hr tablet Take 600 mg by mouth 2 (two) times daily as needed for congestion.      Marland Kitchen lisinopril (PRINIVIL,ZESTRIL) 10 MG tablet Take 10 mg by mouth as needed. Increased blood pressure.      . loratadine (CLARITIN) 10 MG tablet Take 10 mg by mouth daily.      . multivitamin-iron-minerals-folic acid (CENTRUM) chewable tablet Chew 1 tablet by mouth daily.      . polyethylene glycol (MIRALAX / GLYCOLAX) packet Take 17 g by mouth daily as needed. Constipation.      . Tamsulosin HCl (FLOMAX) 0.4 MG CAPS Take 1 capsule (0.4 mg total) by mouth daily after supper.  30 capsule  6  . tiotropium (SPIRIVA HANDIHALER) 18 MCG inhalation capsule Place 1 capsule (18 mcg total) into inhaler and inhale daily.  90 capsule  3   No current facility-administered medications for this visit.    SURGICAL HISTORY:  Past Surgical History  Procedure Laterality Date  . Transurethral resection of prostate  2004  . Total hip arthroplasty  2010, 2011    2010- left; 2011- right  . Upper gastrointestinal endoscopy      REVIEW OF SYSTEMS:  A comprehensive review of systems was negative except for: Constitutional: positive for fatigue Ears, nose, mouth, throat, and face: positive for voice change Respiratory: positive for dyspnea on exertion   PHYSICAL EXAMINATION: General appearance: alert, cooperative, fatigued and no distress Head: Normocephalic, without obvious abnormality, atraumatic Neck: no adenopathy Lymph nodes: Cervical, supraclavicular, and axillary nodes normal. Resp: clear to  auscultation bilaterally Cardio: regular rate and rhythm, S1, S2 normal, no murmur, click, rub or gallop GI: soft, non-tender; bowel sounds normal; no masses,  no organomegaly Extremities: extremities normal, atraumatic, no cyanosis or edema Neurologic: Alert and oriented X 3, normal strength and tone. Normal symmetric reflexes. Normal  coordination and gait  ECOG PERFORMANCE STATUS: 1 - Symptomatic but completely ambulatory  Blood pressure 151/93, pulse 91, temperature 97 F (36.1 C), temperature source Oral, resp. rate 20, height 5' 8.5" (1.74 m), weight 188 lb 9.6 oz (85.548 kg).  LABORATORY DATA: Lab Results  Component Value Date   WBC 8.1 12/25/2012   HGB 14.7 12/25/2012   HCT 42.5 12/25/2012   MCV 85.3 12/25/2012   PLT 217 12/25/2012      Chemistry      Component Value Date/Time   NA 131* 12/23/2012 1542   NA 130* 12/10/2012 1308   K 4.3 12/23/2012 1542   K 4.5 12/10/2012 1308   CL 95* 12/10/2012 1308   CO2 28 12/23/2012 1542   CO2 25 12/10/2012 1308   BUN 23.0 12/23/2012 1542   BUN 21 12/10/2012 1308   CREATININE 1.6* 12/23/2012 1542   CREATININE 1.7* 12/10/2012 1308      Component Value Date/Time   CALCIUM 9.4 12/23/2012 1542   CALCIUM 9.5 12/10/2012 1308   ALKPHOS 58 12/23/2012 1542   ALKPHOS 39 04/24/2012 0918   AST 13 12/23/2012 1542   AST 16 04/24/2012 0918   ALT 15 12/23/2012 1542   ALT 19 04/24/2012 0918   BILITOT 0.48 12/23/2012 1542   BILITOT 0.8 04/24/2012 0918       RADIOGRAPHIC STUDIES: Dg Chest 2 View  12/08/2012   *RADIOLOGY REPORT*  Clinical Data: Cough, shortness of breath, former smoking history  CHEST - 2 VIEW  Comparison: Portable chest x-ray of 11/28/2008  Findings: There is suspicion of a nodular lesion anteriorly on the lateral view near the retrosternal air space possibly overlying the left suprahilar region on the frontal view, worrisome for malignancy.  In view of the patient's smoking history and symptoms, CT of the chest with IV contrast media is recommended to assess further.  There is slight elevation of the left hemidiaphragm as well.  The right lung is clear.  The heart is mildly enlarged. There are degenerative changes throughout the thoracic spine.  IMPRESSION:  1.  Question of a nodular lesion anteriorly possibly overlying the left suprahilar region worrisome for malignancy.  Recommend CT of the  chest with IV contrast media. 2.  Elevation of the left hemidiaphragm. 3.  Cardiomegaly.   Original Report Authenticated By: Dwyane Dee, M.D.   Ct Head W Wo Contrast  12/25/2012   *RADIOLOGY REPORT*  Clinical Data: Lung mass.  Staging.  CT HEAD WITHOUT AND WITH CONTRAST  Technique:  Contiguous axial images were obtained from the base of the skull through the vertex without and with intravenous contrast.  Contrast: 80mL OMNIPAQUE IOHEXOL 300 MG/ML  SOLN  Comparison: PET scan 12/18/2012  Findings: There is no evidence for acute infarction, intracranial hemorrhage, mass lesion, hydrocephalus, or extra-axial fluid.  Mild atrophy is present.  There is mild chronic microvascular ischemic change.  Dystrophic calcification noted  right anterior frontal gyrus and left inferior temporal gyrus, likely old ischemic/traumatic/inflammatory insult.  No signs of surrounding edema to suggest acute process.  The calvarium is intact.  There are no definite osseous lytic or sclerotic lesions.  Scattered lucencies in the calvarium are suspected to represent venous lakes (see  image 20 series 3).  Advanced vascular calcification.  Negative orbits, paranasal sinuses, and mastoids.  Post infusion, there is no abnormal enhancement of the brain or meninges.  No CT signs of proximal vascular thrombosis.  IMPRESSION: Atrophy and chronic microvascular ischemic change.  No definite intracranial metastatic disease.  Please note that MRI with 3T scanning is more sensitive,  if there are no contraindications.  Indeterminate, but non-acute remote insults resulting in dystrophic calcification as described above.  Chronic atherosclerotic change of the intracranial vasculature.  No definite osseous lytic lesions.   Original Report Authenticated By: Davonna Belling, M.D.   Ct Chest W Contrast  12/11/2012   *RADIOLOGY REPORT*  Clinical Data: Possible left lung mass on chest radiograph. Shortness of breath.  Cough.  COPD.  CT CHEST WITH CONTRAST   Technique:  Multidetector CT imaging of the chest was performed following the standard protocol during bolus administration of intravenous contrast.  Contrast: 65mL OMNIPAQUE IOHEXOL 300 MG/ML  SOLN  Comparison: Plain film 12/08/2012  Findings: Lungs/pleura: Mild motion degradation throughout. Repeated with repeated with moderate success.   Left hemidiaphragm elevation.  Moderate centrilobular emphysema.   Innumerable bilateral pulmonary nodules, most consistent with metastatic disease.  Index cavitary right middle lobe or right lower lobe nodule measures 1.1 cm on image 41/series 3. A right lower lobe nodule measures 8 mm on image 38/series 3.  Within the medial left upper lobe is a spiculated mass which measures 3.2 x 2.7 cm on image 18/series 3, consistent with primary bronchogenic carcinoma.  No pleural fluid.  Heart/Mediastinum: Possible left supraclavicular soft tissue fullness posterior to the left jugular vein on image 1/series 2.  Tortuous thoracic aorta.  Mild cardiomegaly with a moderate pericardial effusion. Multivessel coronary artery atherosclerosis. Aortic valvular calcifications.  Right paratracheal adenopathy which measures 2.4 x 2.0 cm on image 15/series 4.  Azygo-esophageal recess node which measures 1.7 cm on image 32/series 4. Localized nodal metastasis within a left suprahilar / lateral AP window region.  Nodal mass measures 4.4 x 1.6 cm on image 20/series 4.  There is adjacent prevascular adenopathy at 1.4 cm on image 17/series 4.  Upper abdomen: Mild motion degradation continuing into the upper abdomen.  Incompletely imaged left renal cysts.  Possible left renal calculi. Normal adrenal glands.  Bones/Musculoskeletal:  No acute osseous abnormality.  IMPRESSION:  1.  Findings most consistent with metastatic primary bronchogenic carcinoma within the left upper lobe.  Dominant lung mass with bilateral thoracic adenopathy and extensive pulmonary nodules/metastasis. 2.  Mild motion degraded exam.  3.  Cardiomegaly with coronary artery atherosclerosis and moderate pericardial effusion. 4.  Cannot exclude adenopathy in the left supraclavicular region. If the patient undergoes PET, recommend attention to this area. 5.  Possible left nephrolithiasis.   Original Report Authenticated By: Jeronimo Greaves, M.D.   Nm Pet Image Initial (pi) Skull Base To Thigh  12/18/2012   *RADIOLOGY REPORT*  Clinical Data: Initial treatment strategy for lung mass.  NUCLEAR MEDICINE PET SKULL BASE TO THIGH  Fasting Blood Glucose:  101  Technique:  19.6 mCi F-18 FDG was injected intravenously. CT data was obtained and used for attenuation correction and anatomic localization only.  (This was not acquired as a diagnostic CT examination.) Additional exam technical data entered on technologist worksheet.  Comparison:  CT chest 12/11/2012.  Findings:  Neck: No hypermetabolic lymph nodes in the neck.  CT images show no acute findings.  Chest:  Hypermetabolic bilateral supraclavicular adenopathy with an S U V max of 14.0  on the right.  Hypermetabolic adenopathy extends into the mediastinum bilaterally.  There are two somewhat ill- defined nodal lesions in the AP window which collectively, have an S U V max 21.2.  Hypermetabolic adenopathy extends inferiorly into the subcarinal station.  Spiculated left upper lobe mass measures 2.5 x 3.5 cm and has an S U V max of 17.0.  A 10 mm nodule is seen in the posterior aspect of the left upper lobe, with an S U V max of 3.2.  There are additional scattered pulmonary nodules bilaterally, some of which do show mild hypermetabolism.  No additional areas of abnormal hypermetabolism in the chest. Moderate pericardial effusion persists.  No pleural fluid.  Abdomen/Pelvis:  The left adrenal gland may be minimally nodular in appearance and is hypermetabolic, with an S U V max of 9.7.  No additional areas of abnormal hypermetabolism in the abdomen or pelvis.  CT images show no acute findings.  Left renal stone  or renal vascular calcification.  Infrarenal aortic aneurysm measures 7.1 x 7.5 cm and ends above the bifurcation.  There are bilateral inguinal hernias, containing fat on the right and unobstructed colon on the left.  Skeleton:  No focal hypermetabolic activity to suggest skeletal metastasis.  IMPRESSION:  1.  Left upper lobe mass with extensive hypermetabolic supraclavicular/mediastinal adenopathy, bilateral pulmonary nodules (some of which are hypermetabolic) and a hypermetabolic nodular appearing left adrenal gland.  Collectively, findings are most consistent with stage IV primary bronchogenic carcinoma. 2.  Large infrarenal aortic aneurysm. 3.  Moderate pericardial effusion, stable.   Original Report Authenticated By: Leanna Battles, M.D.   US Biopsy  12/25/2012   *RADIOLOGY REPORT*  Clinical history:79 year old with a left lung mass and evidence for metastatic disease.  The patient has supraclavicular lymphadenopathy.  The patient needs a tissue diagnosis.  PROCEDURE(S): ULTRASOUND GUIDED RIGHT SUPRACLAVICULAR LYMPH NODE BIOPSY  Physician: Rachelle Hora. Henn, MD  Medications:Versed 1.5 mg, Fentanyl 50 mcg. A radiology nurse monitored the patient for moderate sedation.  Moderate sedation time:20 minutes  Fluoroscopy time: None  Procedure:The procedure was explained to the patient.  The risks and benefits of the procedure were discussed and the patient's questions were addressed.  Informed consent was obtained from the patient.  The neck was evaluated with ultrasound.  Lymph nodes in the right supraclavicular region were selected for biopsy.  The right side of the neck was prepped and draped in a sterile fashion. Skin was anesthetized with lidocaine.  18 gauge core biopsy needle was directed into a conglomeration of lymph nodes in the right supraclavicular region with ultrasound guidance. The nodes are lateral to the right internal jugular vein.  A total of four core biopsies were obtained.  Specimens were placed  in saline.  Findings:Multiple enlarged supraclavicular lymph nodes bilaterally. Small conglomeration of lymph nodes in the right supraclavicular region were biopsied.  No significant bleeding following the core biopsies.  Complications: None  Impression:Successful ultrasound guided core biopsies of lymph nodes in the right supraclavicular region.   Original Report Authenticated By: Richarda Overlie, M.D.    ASSESSMENT AND PLAN: This is a very pleasant 79 years old white male recently diagnosed with metastatic non-small cell lung cancer, adenocarcinoma with extensive supraclavicular and mediastinal lymphadenopathy causing hoarseness of his voice as well as shortness of breath. I have a lengthy discussion with the patient and his family today about his current disease stage, prognosis and treatment options. The molecular study for EGFR mutation as well as ALK gene translocation are  still pending. I recommended for the patient and referred to radiation oncology for consideration of short course of palliative radiotherapy to the extensive supraclavicular and mediastinal lymphadenopathy. I would wait for the molecular study before further discussion of his treatment options. The patient would be considered for treatment with oral target agent does he have any positive of mutation EGFR or ALK. If the mutations are negative, I would consider him for systemic chemotherapy either with carboplatin and Alimta if his creatinine clearance is over 45 mL/min or systemic chemotherapy with carboplatin, paclitaxel and Avastin.  This options will be discussed in more details with the patient and his upcoming visit in 2 weeks. He was advised to call immediately if he has any concerning symptoms in the interval.  All questions were answered. The patient knows to call the clinic with any problems, questions or concerns. We can certainly see the patient much sooner if necessary.  I spent 20 minutes counseling the patient face to  face. The total time spent in the appointment was 30 minutes.

## 2012-12-31 NOTE — Patient Instructions (Signed)
You are recently diagnosed with metastatic non-small cell lung cancer, adenocarcinoma. We discussed treatment options and I will refer you to radiation oncology for short course of palliative radiotherapy to the mediastinal lymph nodes. Followup visit in 2 weeks

## 2012-12-31 NOTE — Telephone Encounter (Signed)
gv and printed appt sched and avs forlpt....pt sched to see Dr. Kathrynn Running 7.21.14 @ 8:00am

## 2013-01-02 ENCOUNTER — Encounter: Payer: Self-pay | Admitting: Radiation Oncology

## 2013-01-02 DIAGNOSIS — C3492 Malignant neoplasm of unspecified part of left bronchus or lung: Secondary | ICD-10-CM

## 2013-01-02 DIAGNOSIS — C3490 Malignant neoplasm of unspecified part of unspecified bronchus or lung: Secondary | ICD-10-CM | POA: Insufficient documentation

## 2013-01-02 NOTE — Progress Notes (Signed)
Histology and Location of Primary Cancer: non-small cell lung cancer, adenocarcinoma presented with left upper lobe lung mass  Location(s) of Symptomatic tumor(s): extensive supraclavicular and mediastinal lymphadenopathy  Past/Anticipated chemotherapy by medical oncology, if any: I would wait for the molecular study before further discussion of his treatment options. The patient would be considered for treatment with oral target agent does he have any positive of mutation EGFR or ALK. If the mutations are negative, I would consider him for systemic chemotherapy either with carboplatin and Alimta if his creatinine clearance is over 45 mL/min or systemic chemotherapy with carboplatin, paclitaxel and Avastin. This options will be discussed in more details with the patient and his upcoming visit in 2 weeks.  Patient's main complaints related to symptomatic tumor(s) are: hoarseness, shortness of breath with mild exertion, mild fatigue.  Pain on a scale of 0-10 is: None noted    If Spine Met(s), symptoms, if any, include:  Bowel/Bladder retention or incontinence (please describe): n/a  Numbness or weakness in extremities (please describe): n/a  Current Decadron regimen, if applicable: n/a  Ambulatory status? Walker? Wheelchair?: normal coordination and gait  SAFETY ISSUES:  Prior radiation? NO  Pacemaker/ICD? NO  Possible current pregnancy? NO  Is the patient on methotrexate? NO  Additional Complaints / other details:  79 year old male. Former smoker. Denies cough, hemoptysis, weight loss, or night sweats. IO:[id number]

## 2013-01-05 ENCOUNTER — Ambulatory Visit
Admission: RE | Admit: 2013-01-05 | Discharge: 2013-01-05 | Disposition: A | Discharge status: Home / Self Care | Payer: Medicare Other | Source: Ambulatory Visit | Attending: Radiation Oncology | Admitting: Radiation Oncology

## 2013-01-05 ENCOUNTER — Encounter: Payer: Self-pay | Admitting: Radiation Oncology

## 2013-01-05 VITALS — BP 164/85 | HR 82 | Temp 97.5°F | Resp 18 | Ht 68.0 in | Wt 188.0 lb

## 2013-01-05 DIAGNOSIS — C3490 Malignant neoplasm of unspecified part of unspecified bronchus or lung: Secondary | ICD-10-CM | POA: Insufficient documentation

## 2013-01-05 DIAGNOSIS — C3492 Malignant neoplasm of unspecified part of left bronchus or lung: Secondary | ICD-10-CM

## 2013-01-05 DIAGNOSIS — Z79899 Other long term (current) drug therapy: Secondary | ICD-10-CM | POA: Insufficient documentation

## 2013-01-05 DIAGNOSIS — R05 Cough: Secondary | ICD-10-CM | POA: Insufficient documentation

## 2013-01-05 DIAGNOSIS — C801 Malignant (primary) neoplasm, unspecified: Secondary | ICD-10-CM | POA: Insufficient documentation

## 2013-01-05 DIAGNOSIS — C770 Secondary and unspecified malignant neoplasm of lymph nodes of head, face and neck: Secondary | ICD-10-CM | POA: Insufficient documentation

## 2013-01-05 DIAGNOSIS — Z510 Encounter for antineoplastic radiation therapy: Secondary | ICD-10-CM | POA: Insufficient documentation

## 2013-01-05 DIAGNOSIS — I10 Essential (primary) hypertension: Secondary | ICD-10-CM | POA: Insufficient documentation

## 2013-01-05 DIAGNOSIS — Z96649 Presence of unspecified artificial hip joint: Secondary | ICD-10-CM | POA: Insufficient documentation

## 2013-01-05 DIAGNOSIS — E785 Hyperlipidemia, unspecified: Secondary | ICD-10-CM | POA: Insufficient documentation

## 2013-01-05 DIAGNOSIS — R0609 Other forms of dyspnea: Secondary | ICD-10-CM | POA: Insufficient documentation

## 2013-01-05 DIAGNOSIS — R599 Enlarged lymph nodes, unspecified: Secondary | ICD-10-CM | POA: Insufficient documentation

## 2013-01-05 DIAGNOSIS — Z87891 Personal history of nicotine dependence: Secondary | ICD-10-CM | POA: Insufficient documentation

## 2013-01-05 DIAGNOSIS — R0989 Other specified symptoms and signs involving the circulatory and respiratory systems: Secondary | ICD-10-CM | POA: Insufficient documentation

## 2013-01-05 DIAGNOSIS — R059 Cough, unspecified: Secondary | ICD-10-CM | POA: Insufficient documentation

## 2013-01-05 MED ORDER — LORAZEPAM 1 MG PO TABS
1.0000 mg | ORAL_TABLET | Freq: Once | ORAL | Status: AC
  Administered 2013-01-05: 1 mg via ORAL
  Filled 2013-01-05: qty 1

## 2013-01-05 MED ORDER — LORAZEPAM 1 MG PO TABS: 1.0000 mg | ORAL_TABLET | Freq: Three times a day (TID) | ORAL | Status: DC

## 2013-01-05 NOTE — Progress Notes (Signed)
Radiation Oncology         [phone] ________________________________  Initial outpatient Consultation  Name: Tyler [name] MRN: [id number]  Date: 01/05/2013  DOB: 1933/05/02  WU:[id number],FAOZH Homero Fellers, MD     REFERRING PHYSICIAN: Si Gaul, MD  DIAGNOSIS: 79 year old gentleman with stage IV adenocarcinoma lung  HISTORY OF PRESENT ILLNESS::Tyler [name] is a 79 y.o. male who has a history of smoking one pack per day for around 42 years but quit 15 years ago.  For several months the patient has been complaining of cough and, shortness of breath as well as fatigue and tightness in his chest. He was referred to Dr. Kendrick Fries for evaluation of his questionable seasonal allergy. Chest x-ray was performed on 12/08/2012 and showed questionable nodular lesion anteriorly overlying the left suprahilar region worrisome for malignancy. This was followed by CT scan of the chest on 12/11/2012 and it showed Innumerable bilateral pulmonary nodules, most consistent with metastatic disease. Index cavitary right middle lobe or right lower lobe nodule measures 1.1 cm. A right lower lobe nodule measures 8 mm.Within the medial left upper lobe is a spiculated mass which measures 3.2 x 2.7 cm consistent with primary bronchogenic carcinoma. A PET scan was performed on 12/18/2012 and it showed Left upper lobe mass with extensive hypermetabolic supraclavicular/mediastinal adenopathy, bilateral pulmonary nodules (some of which are hypermetabolic) and a hypermetabolic nodular appearing left adrenal gland. He had CT scan of the head performed recently that showed no evidence for metastatic disease to the brain. He also had ultrasound guided biopsy of the right supraclavicular lymph node by interventional radiology and the final pathology was consistent with metastatic adenocarcinoma of lung primary.  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    PAST SURGICAL HISTORY: Past Surgical History  Procedure Laterality Date  . Transurethral resection of prostate  2004  . Total hip arthroplasty  2010, 2011    2010- left; 2011- right  . Upper gastrointestinal endoscopy      FAMILY HISTORY: family history is negative for Colon polyps, and Stomach cancer, and Colon cancer, and Cancer, .  SOCIAL HISTORY:  reports that he quit smoking about 14 years ago. His smoking use included Cigarettes. He has a 40 pack-year smoking history. He has never used smokeless tobacco. He reports that he does not drink alcohol or use illicit drugs.  ALLERGIES: Vicodin  MEDICATIONS:  Current Outpatient Prescriptions  Medication Sig Dispense Refill  . cholecalciferol (VITAMIN D) 1000 UNITS tablet Take 1,000 Units by mouth daily.      Marland Kitchen diltiazem (DILACOR XR) 240 MG 24 hr capsule TAKE ONE CAPSULE BY MOUTH EVERY DAY  90 capsule  1  . guaiFENesin (MUCINEX) 600 MG 12 hr tablet Take 600 mg by mouth 2 (two) times daily as needed for congestion.      Marland Kitchen lisinopril (PRINIVIL,ZESTRIL) 10 MG tablet Take 10 mg by mouth as needed. Increased blood pressure.      . multivitamin-iron-minerals-folic acid (CENTRUM) chewable tablet Chew 1 tablet by mouth daily.      . polyethylene glycol (MIRALAX / GLYCOLAX) packet Take 17 g by mouth daily  as needed. Constipation.      . Tamsulosin HCl (FLOMAX) 0.4 MG CAPS Take 1 capsule (0.4 mg total) by mouth daily after supper.  30 capsule  6  . tiotropium (SPIRIVA HANDIHALER) 18 MCG inhalation capsule  Place 1 capsule (18 mcg total) into inhaler and inhale daily.  90 capsule  3  . ALPRAZolam (XANAX) 0.5 MG tablet       . finasteride (PROSCAR) 5 MG tablet       . fluticasone (FLONASE) 50 MCG/ACT nasal spray       . loratadine (CLARITIN) 10 MG tablet Take 10 mg by mouth daily.       No current facility-administered medications for this encounter.    REVIEW OF SYSTEMS:  A 15 point review of systems is documented in the electronic medical record. This was obtained by the nursing staff. However, I reviewed this with the patient to discuss relevant findings and make appropriate changes.  Pertinent items are noted in HPI.   PHYSICAL EXAM:  height is 5\' 8"  (1.727 m) and weight is 188 lb (85.276 kg). His oral temperature is 97.5 F (36.4 C). His blood pressure is 164/85 and his pulse is 82. His respiration is 18 and oxygen saturation is 95%.   WUJ:WJXBJ, healthy, no distress, well nourished and well developed  LABORATORY DATA:  Lab Results  Component Value Date   WBC 8.1 12/25/2012   HGB 14.7 12/25/2012   HCT 42.5 12/25/2012   MCV 85.3 12/25/2012   PLT 217 12/25/2012   Lab Results  Component Value Date   NA 131* 12/23/2012   K 4.3 12/23/2012   CL 95* 12/10/2012   CO2 28 12/23/2012   Lab Results  Component Value Date   ALT 15 12/23/2012   AST 13 12/23/2012   ALKPHOS 58 12/23/2012   BILITOT 0.48 12/23/2012     RADIOGRAPHY: Dg Chest 2 View  12/08/2012   *RADIOLOGY REPORT*  Clinical Data: Cough, shortness of breath, former smoking history  CHEST - 2 VIEW  Comparison: Portable chest x-ray of 11/28/2008  Findings: There is suspicion of a nodular lesion anteriorly on the lateral view near the retrosternal air space possibly overlying the left suprahilar region on the frontal view, worrisome for malignancy.  In view of the patient's smoking history and symptoms, CT of the chest with IV contrast media is recommended to assess further.  There is slight elevation of the left hemidiaphragm as well.  The right lung  is clear.  The heart is mildly enlarged. There are degenerative changes throughout the thoracic spine.  IMPRESSION:  1.  Question of a nodular lesion anteriorly possibly overlying the left suprahilar region worrisome for malignancy.  Recommend CT of the chest with IV contrast media. 2.  Elevation of the left hemidiaphragm. 3.  Cardiomegaly.   Original Report Authenticated By: Dwyane Dee, M.D.   Ct Head W Wo Contrast  12/25/2012   *RADIOLOGY REPORT*  Clinical Data: Lung mass.  Staging.  CT HEAD WITHOUT AND WITH CONTRAST  Technique:  Contiguous axial images were obtained from the base of the skull through the vertex without and with intravenous contrast.  Contrast: 80mL OMNIPAQUE IOHEXOL 300 MG/ML  SOLN  Comparison: PET scan 12/18/2012  Findings: There is no evidence for acute infarction, intracranial hemorrhage, mass lesion, hydrocephalus, or extra-axial fluid.  Mild atrophy is present.  There is mild chronic microvascular ischemic change.  Dystrophic calcification noted  right anterior frontal gyrus and left inferior temporal gyrus, likely old ischemic/traumatic/inflammatory insult.  No signs of surrounding edema to suggest acute process.  The calvarium is intact.  There are no definite osseous lytic or sclerotic lesions.  Scattered lucencies in the calvarium are suspected to represent venous lakes (see   image 20 series 3).  Advanced vascular calcification.  Negative orbits, paranasal sinuses, and mastoids.  Post infusion, there is no abnormal enhancement of the brain or meninges.  No CT signs of proximal vascular thrombosis.  IMPRESSION: Atrophy and chronic microvascular ischemic change.  No definite intracranial metastatic disease.  Please note that MRI with 3T scanning is more sensitive,  if there are no contraindications.  Indeterminate, but non-acute remote insults resulting in dystrophic calcification as described above.  Chronic atherosclerotic change of the intracranial vasculature.  No definite osseous  lytic lesions.   Original Report Authenticated By: Davonna Belling, M.D.   Ct Chest W Contrast  12/11/2012   *RADIOLOGY REPORT*  Clinical Data: Possible left lung mass on chest radiograph. Shortness of breath.  Cough.  COPD.  CT CHEST WITH CONTRAST  Technique:  Multidetector CT imaging of the chest was performed following the standard protocol during bolus administration of intravenous contrast.  Contrast: 65mL OMNIPAQUE IOHEXOL 300 MG/ML  SOLN  Comparison: Plain film 12/08/2012  Findings: Lungs/pleura: Mild motion degradation throughout. Repeated with repeated with moderate success.   Left hemidiaphragm elevation.  Moderate centrilobular emphysema.   Innumerable bilateral pulmonary nodules, most consistent with metastatic disease.  Index cavitary right middle lobe or right lower lobe nodule measures 1.1 cm on image 41/series 3. A right lower lobe nodule measures 8 mm on image 38/series 3.  Within the medial left upper lobe is a spiculated mass which measures 3.2 x 2.7 cm on image 18/series 3, consistent with primary bronchogenic carcinoma.  No pleural fluid.  Heart/Mediastinum: Possible left supraclavicular soft tissue fullness posterior to the left jugular vein on image 1/series 2.  Tortuous thoracic aorta.  Mild cardiomegaly with a moderate pericardial effusion. Multivessel coronary artery atherosclerosis. Aortic valvular calcifications.  Right paratracheal adenopathy which measures 2.4 x 2.0 cm on image 15/series 4.  Azygo-esophageal recess node which measures 1.7 cm on image 32/series 4. Localized nodal metastasis within a left suprahilar / lateral AP window region.  Nodal mass measures 4.4 x 1.6 cm on image 20/series 4.  There is adjacent prevascular adenopathy at 1.4 cm on image 17/series 4.  Upper abdomen: Mild motion degradation continuing into the upper abdomen.  Incompletely imaged left renal cysts.  Possible left renal calculi. Normal adrenal glands.  Bones/Musculoskeletal:  No acute osseous abnormality.   IMPRESSION:  1.  Findings most consistent with metastatic primary bronchogenic carcinoma within the left upper lobe.  Dominant lung mass with bilateral thoracic adenopathy and extensive pulmonary nodules/metastasis. 2.  Mild motion degraded exam. 3.  Cardiomegaly with coronary artery atherosclerosis and moderate pericardial effusion. 4.  Cannot exclude adenopathy in the left supraclavicular region. If the patient undergoes PET, recommend attention to this area. 5.  Possible left nephrolithiasis.   Original Report Authenticated By: Jeronimo Greaves, M.D.   Nm Pet Image Initial (pi) Skull Base To Thigh  12/18/2012   *RADIOLOGY REPORT*  Clinical Data: Initial treatment strategy for lung mass.  NUCLEAR MEDICINE PET SKULL BASE TO THIGH  Fasting Blood Glucose:  101  Technique:  19.6 mCi F-18 FDG was injected intravenously. CT data was obtained and used for attenuation correction and anatomic localization only.  (This was not acquired as a diagnostic CT examination.) Additional exam technical data entered on technologist worksheet.  Comparison:  CT chest 12/11/2012.  Findings:  Neck: No hypermetabolic lymph nodes in the neck.  CT images show no acute findings.  Chest:  Hypermetabolic bilateral supraclavicular adenopathy with an S U V max of 14.0 on the right.  Hypermetabolic adenopathy extends into the mediastinum bilaterally.  There are two somewhat ill- defined nodal lesions in the AP window which collectively, have an S U V max 21.2.  Hypermetabolic adenopathy extends inferiorly into the subcarinal station.  Spiculated left upper lobe mass measures 2.5 x 3.5 cm and has an S U V max of 17.0.  A 10 mm nodule is seen in the posterior aspect of the left upper lobe, with an S U V max of 3.2.  There are additional scattered pulmonary nodules bilaterally, some of which do show mild hypermetabolism.  No additional areas of abnormal hypermetabolism in the chest. Moderate pericardial effusion persists.  No pleural fluid.   Abdomen/Pelvis:  The left adrenal gland may be minimally nodular in appearance and is hypermetabolic, with an S U V max of 9.7.  No additional areas of abnormal hypermetabolism in the abdomen or pelvis.  CT images show no acute findings.  Left renal stone or renal vascular calcification.  Infrarenal aortic aneurysm measures 7.1 x 7.5 cm and ends above the bifurcation.  There are bilateral inguinal hernias, containing fat on the right and unobstructed colon on the left.  Skeleton:  No focal hypermetabolic activity to suggest skeletal metastasis.  IMPRESSION:  1.  Left upper lobe mass with extensive hypermetabolic supraclavicular/mediastinal adenopathy, bilateral pulmonary nodules (some of which are hypermetabolic) and a hypermetabolic nodular appearing left adrenal gland.  Collectively, findings are most consistent with stage IV primary bronchogenic carcinoma. 2.  Large infrarenal aortic aneurysm. 3.  Moderate pericardial effusion, stable.   Original Report Authenticated By: Leanna Battles, M.D.   US Biopsy  12/25/2012   *RADIOLOGY REPORT*  Clinical history:79 year old with a left lung mass and evidence for metastatic disease.  The patient has supraclavicular lymphadenopathy.  The patient needs a tissue diagnosis.  PROCEDURE(S): ULTRASOUND GUIDED RIGHT SUPRACLAVICULAR LYMPH NODE BIOPSY  Physician: Rachelle Hora. Henn, MD  Medications:Versed 1.5 mg, Fentanyl 50 mcg. A radiology nurse monitored the patient for moderate sedation.  Moderate sedation time:20 minutes  Fluoroscopy time: None  Procedure:The procedure was explained to the patient.  The risks and benefits of the procedure were discussed and the patient's questions were addressed.  Informed consent was obtained from the patient.  The neck was evaluated with ultrasound.  Lymph nodes in the right supraclavicular region were selected for biopsy.  The right side of the neck was prepped and draped in a sterile fashion. Skin was anesthetized with lidocaine.  18 gauge  core biopsy needle was directed into a conglomeration of lymph nodes in the right supraclavicular region with ultrasound guidance. The nodes are lateral to the right internal jugular vein.  A total of four core biopsies were obtained.  Specimens were placed in saline.  Findings:Multiple enlarged supraclavicular lymph nodes bilaterally. Small conglomeration of lymph nodes in the right supraclavicular region were biopsied.  No significant bleeding following the core biopsies.  Complications: None  Impression:Successful ultrasound guided core biopsies of lymph nodes in the right supraclavicular region.   Original Report Authenticated By: Richarda Overlie, M.D.      IMPRESSION: This patient is a very nice 79 year-old gentleman with stage IV metastatic adenocarcinoma lung. He has extensive lymphadenopathy in the upper mediastinum and supraclavicular region which is contributing to his dyspnea.  The patient may benefit from a brief course radiation to upper chest and lower neck in order to palliate dyspnea.  PLAN:Today, I talked to the patient and family about the findings and work-up thus far.  We discussed the natural history of disease and general treatment, highlighting the role or radiotherapy in the management.  We discussed the available radiation techniques, and focused on the details of logistics and delivery.  We reviewed the anticipated acute and late sequelae associated with radiation in this setting.  The patient was encouraged to ask questions that I answered to the best of my ability.  I filled out a patient counseling form during our discussion including treatment diagrams.  We retained a copy for our records.  The patient would like to proceed with radiation and will be scheduled for CT simulation.  I spent 60 minutes minutes face to face with the patient and more than 50% of that time was spent in counseling and/or coordination of care.    ------------------------------------------------  Artist Pais.  Kathrynn Running, M.D.

## 2013-01-05 NOTE — Progress Notes (Signed)
See progress note under physician encounter. 

## 2013-01-05 NOTE — Progress Notes (Signed)
Family expresses a desire to begin radiation therapy asap. Reports that over the last ten days the patient's cough and hoarseness has gotten much worse. Patient reports a persistently cough that is mostly dry. Reports hoarseness that is getting worse. Shortness of breath with mild exertion noted. Denies nausea, vomiting, headache or dizziness. Denies night sweats or weight loss.

## 2013-01-05 NOTE — Progress Notes (Signed)
  Radiation Oncology         [phone]) [phone] ________________________________  Name: YOSGART PAVEY MRN: [id number]  Date: 01/05/2013  DOB: January 10, 1933  SIMULATION AND TREATMENT PLANNING NOTE  DIAGNOSIS:  79 year old gentleman with dyspnea from stage IV adenocarcinoma lung  NARRATIVE:  The patient was brought to the CT Simulation planning suite.  Identity was confirmed.  All relevant records and images related to the planned course of therapy were reviewed.  The patient freely provided informed written consent to proceed with treatment after reviewing the details related to the planned course of therapy. The consent form was witnessed and verified by the simulation staff.  Then, the patient was set-up in a stable reproducible  supine position for radiation therapy.  CT images were obtained.  Surface markings were placed.  The CT images were loaded into the planning software.  Then the target and avoidance structures were contoured.  Treatment planning then occurred.  The radiation prescription was entered and confirmed.  Then, I designed and supervised the construction of a total of 3 medically necessary complex treatment devices including a thermoplastic head mask for daily positioning as well as 2 multileaf collimators to shape radiation around the disease in the upper chest and lower neck. The multileaf collimators to shield sensitive structures from exposure including the lungs heart and spinal cord.  I have requested : Isodose Plan.  I have ordered:Nutrition Consult  PLAN:  The patient will receive 35 Gy in 14 fraction.  ________________________________  Artist Pais Kathrynn Running, M.D.

## 2013-01-08 ENCOUNTER — Ambulatory Visit
Admission: RE | Admit: 2013-01-08 | Discharge: 2013-01-08 | Disposition: A | Discharge status: Home / Self Care | Payer: Medicare Other | Source: Ambulatory Visit | Attending: Radiation Oncology | Admitting: Radiation Oncology

## 2013-01-08 DIAGNOSIS — C3492 Malignant neoplasm of unspecified part of left bronchus or lung: Secondary | ICD-10-CM

## 2013-01-08 NOTE — Progress Notes (Signed)
  Radiation Oncology         [phone] ________________________________  Name: Tyler [name] MRN: [id number]  Date: 01/08/2013  DOB: 1932-08-15  Simulation Verification Note  Status: outpatient  NARRATIVE: The patient was brought to the treatment unit and placed in the planned treatment position. The clinical setup was verified. Then port films were obtained and uploaded to the radiation oncology medical record software.  The treatment beams were carefully compared against the planned radiation fields. The position location and shape of the radiation fields was reviewed. They targeted volume of tissue appears to be appropriately covered by the radiation beams. Organs at risk appear to be excluded as planned.  Based on my personal review, I approved the simulation verification. The patient's treatment will proceed as planned.  ------------------------------------------------  Artist Pais Kathrynn Running, M.D.

## 2013-01-09 ENCOUNTER — Ambulatory Visit
Admission: RE | Admit: 2013-01-09 | Discharge: 2013-01-09 | Disposition: A | Discharge status: Home / Self Care | Payer: Medicare Other | Source: Ambulatory Visit | Attending: Radiation Oncology | Admitting: Radiation Oncology

## 2013-01-09 ENCOUNTER — Encounter: Payer: Self-pay | Admitting: Radiation Oncology

## 2013-01-09 VITALS — BP 133/92 | HR 83 | Temp 97.8°F | Resp 16 | Wt 190.2 lb

## 2013-01-09 DIAGNOSIS — C3492 Malignant neoplasm of unspecified part of left bronchus or lung: Secondary | ICD-10-CM

## 2013-01-09 MED ORDER — RADIAPLEXRX EX GEL
Freq: Once | CUTANEOUS | Status: AC
  Administered 2013-01-09: 12:00:00 via TOPICAL

## 2013-01-09 NOTE — Progress Notes (Signed)
Department of Radiation Oncology  Phone:  [phone] Fax:        [phone]  Weekly Treatment Note    Name: Tyler [name] Date: 01/09/2013 MRN: [id number] DOB: 1933-04-13   Current dose: 5 Gy  Current fraction: 2   MEDICATIONS: Current Outpatient Prescriptions  Medication Sig Dispense Refill  . ALPRAZolam (XANAX) 0.5 MG tablet       . cholecalciferol (VITAMIN D) 1000 UNITS tablet Take 1,000 Units by mouth daily.      Marland Kitchen diltiazem (DILACOR XR) 240 MG 24 hr capsule TAKE ONE CAPSULE BY MOUTH EVERY DAY  90 capsule  1  . finasteride (PROSCAR) 5 MG tablet       . fluticasone (FLONASE) 50 MCG/ACT nasal spray       . guaiFENesin (MUCINEX) 600 MG 12 hr tablet Take 600 mg by mouth 2 (two) times daily as needed for congestion.      Marland Kitchen lisinopril (PRINIVIL,ZESTRIL) 10 MG tablet Take 10 mg by mouth as needed. Increased blood pressure.      . loratadine (CLARITIN) 10 MG tablet Take 10 mg by mouth daily.      Marland Kitchen LORazepam (ATIVAN) 1 MG tablet Take 1 tablet (1 mg total) by mouth every 8 (eight) hours.  20 tablet  0  . multivitamin-iron-minerals-folic acid (CENTRUM) chewable tablet Chew 1 tablet by mouth daily.      . polyethylene glycol (MIRALAX / GLYCOLAX) packet Take 17 g by mouth daily as needed. Constipation.      . Tamsulosin HCl (FLOMAX) 0.4 MG CAPS Take 1 capsule (0.4 mg total) by mouth daily after supper.  30 capsule  6  . tiotropium (SPIRIVA HANDIHALER) 18 MCG inhalation capsule Place 1 capsule (18 mcg total) into inhaler and inhale daily.  90 capsule  3   No current facility-administered medications for this encounter.     ALLERGIES: Vicodin   LABORATORY DATA:  Lab Results  Component Value Date   WBC 8.1 12/25/2012   HGB 14.7 12/25/2012   HCT 42.5 12/25/2012   MCV 85.3 12/25/2012   PLT 217 12/25/2012   Lab Results  Component Value Date   NA 131* 12/23/2012   K 4.3 12/23/2012   CL 95* 12/10/2012   CO2 28 12/23/2012   Lab Results  Component Value Date   ALT 15 12/23/2012   AST 13 12/23/2012   ALKPHOS 58 12/23/2012   BILITOT 0.48 12/23/2012     NARRATIVE: Tyler [name] was seen today for weekly treatment management. The chart was checked and the patient's films were reviewed. The patient is doing fine with treatment during his first week. He is accompanied today by his wife and daughter. He is having some cough and is using Robitussin for this. In discussing this with him, it does not appear that he needs to add anything else at this time although he is to let us know if this worsens. The patient's family had a number of questions regarding his prognosis and overall treatment plan. We discussed this to the level that I could given that I am not his attending physician. He is also seeing medical oncology next week and I believe that this discussion will be very helpful for them.  PHYSICAL EXAMINATION: weight is 190 lb 3.2 oz (86.274 kg). His oral temperature is 97.8 F (36.6 C). His blood pressure is 133/92 and his pulse is 83. His respiration is 16 and oxygen saturation is 96%.        ASSESSMENT: The patient  is doing satisfactorily with treatment.  PLAN: We will continue with the patient's radiation treatment as planned.

## 2013-01-09 NOTE — Progress Notes (Signed)
Persistent productive cough with clear sputum reported. Taking robitussin dm with minimal relief. Hoarseness noted. Denies painful or difficult swallowing. Denies headache, dizziness, nausea or vomiting.

## 2013-01-12 ENCOUNTER — Ambulatory Visit
Admission: RE | Admit: 2013-01-12 | Discharge: 2013-01-12 | Disposition: A | Discharge status: Home / Self Care | Payer: Medicare Other | Source: Ambulatory Visit | Attending: Radiation Oncology | Admitting: Radiation Oncology

## 2013-01-12 ENCOUNTER — Telehealth: Payer: Self-pay | Admitting: Pulmonary Disease

## 2013-01-12 ENCOUNTER — Ambulatory Visit: Payer: Medicare Other | Admitting: Pulmonary Disease

## 2013-01-12 MED ORDER — TIOTROPIUM BROMIDE MONOHYDRATE 18 MCG IN CAPS: 18.0000 ug | ORAL_CAPSULE | Freq: Every day | RESPIRATORY_TRACT | Status: DC

## 2013-01-12 NOTE — Telephone Encounter (Signed)
Spouse returned call. Hazel Sams

## 2013-01-12 NOTE — Telephone Encounter (Signed)
I spoke with pt. He is requesting samples of spiriva. He wants to pick this up in [place]. I advised pt will elave sample upfront. Nothing further was needed

## 2013-01-12 NOTE — Telephone Encounter (Signed)
lmtcb x1 for pt. 

## 2013-01-13 ENCOUNTER — Telehealth: Payer: Self-pay | Admitting: *Deleted

## 2013-01-13 ENCOUNTER — Encounter: Payer: Self-pay | Admitting: Internal Medicine

## 2013-01-13 ENCOUNTER — Telehealth: Payer: Self-pay | Admitting: Internal Medicine

## 2013-01-13 ENCOUNTER — Ambulatory Visit (HOSPITAL_BASED_OUTPATIENT_CLINIC_OR_DEPARTMENT_OTHER): Payer: Medicare Other | Admitting: Internal Medicine

## 2013-01-13 ENCOUNTER — Ambulatory Visit
Admission: RE | Admit: 2013-01-13 | Discharge: 2013-01-13 | Disposition: A | Discharge status: Home / Self Care | Payer: Medicare Other | Source: Ambulatory Visit | Attending: Radiation Oncology | Admitting: Radiation Oncology

## 2013-01-13 ENCOUNTER — Other Ambulatory Visit (HOSPITAL_BASED_OUTPATIENT_CLINIC_OR_DEPARTMENT_OTHER): Payer: Medicare Other | Admitting: Lab

## 2013-01-13 VITALS — BP 156/84 | HR 83 | Temp 97.6°F | Resp 20 | Ht 68.0 in | Wt 186.2 lb

## 2013-01-13 DIAGNOSIS — C3490 Malignant neoplasm of unspecified part of unspecified bronchus or lung: Secondary | ICD-10-CM

## 2013-01-13 LAB — COMPREHENSIVE METABOLIC PANEL (CC13)
ALT: 14 U/L (ref 0–55)
AST: 14 U/L (ref 5–34)
Albumin: 3.7 g/dL (ref 3.5–5.0)
CO2: 24 mEq/L (ref 22–29)
Calcium: 9.4 mg/dL (ref 8.4–10.4)
Chloride: 92 mEq/L — ABNORMAL LOW (ref 98–109)
Potassium: 4.4 mEq/L (ref 3.5–5.1)
Sodium: 125 mEq/L — ABNORMAL LOW (ref 136–145)
Total Protein: 7 g/dL (ref 6.4–8.3)

## 2013-01-13 LAB — CBC WITH DIFFERENTIAL/PLATELET
BASO%: 0.4 % (ref 0.0–2.0)
EOS%: 2.6 % (ref 0.0–7.0)
HCT: 41.1 % (ref 38.4–49.9)
MCH: 30 pg (ref 27.2–33.4)
MCHC: 34.2 g/dL (ref 32.0–36.0)
MONO#: 0.6 10*3/uL (ref 0.1–0.9)
RDW: 13.6 % (ref 11.0–14.6)
lymph#: 0.6 10*3/uL — ABNORMAL LOW (ref 0.9–3.3)

## 2013-01-13 MED ORDER — CYANOCOBALAMIN 1000 MCG/ML IJ SOLN
1000.0000 ug | Freq: Once | INTRAMUSCULAR | Status: AC
  Administered 2013-01-13: 1000 ug via INTRAMUSCULAR

## 2013-01-13 MED ORDER — FOLIC ACID 1 MG PO TABS: 1.0000 mg | ORAL_TABLET | Freq: Every day | ORAL | Status: DC

## 2013-01-13 MED ORDER — PROCHLORPERAZINE MALEATE 10 MG PO TABS: 10.0000 mg | ORAL_TABLET | Freq: Four times a day (QID) | ORAL | Status: DC | PRN

## 2013-01-13 MED ORDER — DEXAMETHASONE 4 MG PO TABS: ORAL_TABLET | ORAL | Status: DC

## 2013-01-13 NOTE — Telephone Encounter (Signed)
gv and printed appt sched and avs for pt....MW added tx   °

## 2013-01-13 NOTE — Addendum Note (Signed)
Encounter addended by: Agnes Lawrence, RN on: 01/13/2013  1:28 PM<BR>     Documentation filed: Inpatient Document Flowsheet, Inpatient Patient Education, Notes Section, Chief Complaint Section

## 2013-01-13 NOTE — Progress Notes (Signed)
[facility] [phone]   [phone]  OFFICE PROGRESS NOTE  Rogelia Boga, MD [address] [place] [address]  DIAGNOSIS AND STAGE: Metastatic non-small cell lung cancer, adenocarcinoma presented with left upper lobe lung mass in addition to extensive supraclavicular, mediastinal adenopathy and bilateral pulmonary nodules diagnosed in July of 2014. EGFR mutation as well as ALK gene translocation are negative.   PRIOR THERAPY: None   CURRENT THERAPY:  1) Palliative radiotherapy to the enlarged mediastinal and supraclavicular lymph nodes under the care of Dr. Kathrynn Running. 2) Systemic chemotherapy with carboplatin for AUC of 5 and Alimta 500 mg/M2 giving every 3 weeks. First dose expected on 01/21/2013.  CHEMOTHERAPY INTENT: Palliative  CURRENT # OF CHEMOTHERAPY CYCLES: 0  CURRENT ANTIEMETICS: Zofran, dexamethasone and Compazine  CURRENT SMOKING STATUS: currently a nonsmoker  ORAL CHEMOTHERAPY AND CONSENT: None  CURRENT BISPHOSPHONATES USE: None  PAIN MANAGEMENT: No pain  NARCOTICS INDUCED CONSTIPATION: N/A  LIVING WILL AND CODE STATUS: Full code   INTERVAL HISTORY: Tyler [name] 79 y.o. male returns to the clinic today for follow up visit accompanied by his girlfriend and his daughter. The patient is feeling fine today except for the generalized fatigue and shortness breath with exertion. He also continues to have hoarseness of his voice.He denied having any significant chest pain but has intermittent cough with thick mucous. He currently with Robitussin DM with mild improvement. The patient was started on palliative radiotherapy to the enlarging mediastinal lymph nodes under the care of Dr. Kathrynn Running. He has 4/14 fractions so far. He denied having any significant fever or chills but he denied having any nausea or vomiting. The molecular study for EGFR mutation as well as ALK gene translocation were reported to be  negative. The patient is here today for evaluation and discussion of his treatment options.  MEDICAL HISTORY: Past Medical History  Diagnosis Date  . BENIGN PROSTATIC HYPERTROPHY 07/23/2008  . 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 HIP PAIN, LEFT 07/04/2007  . HYPERLIPIDEMIA 01/02/2007  . HYPERTENSION 01/02/2007  . INSOMNIA 11/16/2008  . PROSTATE SPECIFIC ANTIGEN, ELEVATED 09/10/2008  . Lung cancer     metastatic non small cell lung cancer, adenocarcinoma     ALLERGIES:  is allergic to vicodin.  MEDICATIONS:  Current Outpatient Prescriptions  Medication Sig Dispense Refill  . cholecalciferol (VITAMIN D) 1000 UNITS tablet Take 1,000 Units by mouth daily.      Marland Kitchen diltiazem (DILACOR XR) 240 MG 24 hr capsule TAKE ONE CAPSULE BY MOUTH EVERY DAY  90 capsule  1  . fluticasone (FLONASE) 50 MCG/ACT nasal spray       . guaiFENesin (MUCINEX) 600 MG 12 hr tablet Take 600 mg by mouth 2 (two) times daily as needed for congestion.      Marland Kitchen lisinopril (PRINIVIL,ZESTRIL) 10 MG tablet Take 10 mg by mouth as needed. Increased blood pressure.      . loratadine (CLARITIN) 10 MG tablet Take 10 mg by mouth daily.      Marland Kitchen LORazepam (ATIVAN) 1 MG tablet Take 1 tablet (1 mg total) by mouth every 8 (eight) hours.  20 tablet  0  . multivitamin-iron-minerals-folic acid (CENTRUM) chewable tablet Chew 1 tablet by mouth daily.      . polyethylene glycol (MIRALAX / GLYCOLAX) packet Take 17 g by mouth daily as needed. Constipation.      . Tamsulosin HCl (FLOMAX) 0.4 MG CAPS Take 1 capsule (0.4 mg total) by mouth daily after supper.  30 capsule  6  . tiotropium (SPIRIVA HANDIHALER) 18 MCG inhalation capsule Place 1 capsule (18 mcg total) into inhaler and inhale daily.  20 capsule  0   No current facility-administered medications for this visit.    SURGICAL HISTORY:  Past Surgical History  Procedure Laterality Date  . Transurethral resection of prostate  2004  . Total hip arthroplasty  2010, 2011    2010- left; 2011-  right  . Upper gastrointestinal endoscopy      REVIEW OF SYSTEMS:  A comprehensive review of systems was negative except for: Constitutional: positive for anorexia and fatigue Ears, nose, mouth, throat, and face: positive for hoarseness Respiratory: positive for cough and dyspnea on exertion   PHYSICAL EXAMINATION: General appearance: alert, cooperative, fatigued and no distress Head: Normocephalic, without obvious abnormality, atraumatic Neck: no adenopathy Lymph nodes: Cervical, supraclavicular, and axillary nodes normal. Resp: wheezes bilaterally Back: symmetric, no curvature. ROM normal. No CVA tenderness. Cardio: regular rate and rhythm, S1, S2 normal, no murmur, click, rub or gallop GI: soft, non-tender; bowel sounds normal; no masses,  no organomegaly Extremities: extremities normal, atraumatic, no cyanosis or edema Neurologic: Alert and oriented X 3, normal strength and tone. Normal symmetric reflexes. Normal coordination and gait  ECOG PERFORMANCE STATUS: 1-2 - Symptomatic, <50% confined to bed  Blood pressure 156/84, pulse 83, temperature 97.6 F (36.4 C), temperature source Oral, resp. rate 20, height 5\' 8"  (1.727 m), weight 186 lb 3.2 oz (84.46 kg).  LABORATORY DATA: Lab Results  Component Value Date   WBC 8.1 01/13/2013   HGB 14.0 01/13/2013   HCT 41.1 01/13/2013   MCV 87.9 01/13/2013   PLT 179 01/13/2013      Chemistry      Component Value Date/Time   NA 125* 01/13/2013 1014   NA 130* 12/10/2012 1308   K 4.4 01/13/2013 1014   K 4.5 12/10/2012 1308   CL 95* 12/10/2012 1308   CO2 24 01/13/2013 1014   CO2 25 12/10/2012 1308   BUN 15.6 01/13/2013 1014   BUN 21 12/10/2012 1308   CREATININE 1.4* 01/13/2013 1014   CREATININE 1.7* 12/10/2012 1308      Component Value Date/Time   CALCIUM 9.4 01/13/2013 1014   CALCIUM 9.5 12/10/2012 1308   ALKPHOS 49 01/13/2013 1014   ALKPHOS 39 04/24/2012 0918   AST 14 01/13/2013 1014   AST 16 04/24/2012 0918   ALT 14 01/13/2013 1014   ALT 19  04/24/2012 0918   BILITOT 0.89 01/13/2013 1014   BILITOT 0.8 04/24/2012 0918       RADIOGRAPHIC STUDIES: Ct Head W Wo Contrast  12/25/2012   *RADIOLOGY REPORT*  Clinical Data: Lung mass.  Staging.  CT HEAD WITHOUT AND WITH CONTRAST  Technique:  Contiguous axial images were obtained from the base of the skull through the vertex without and with intravenous contrast.  Contrast: 80mL OMNIPAQUE IOHEXOL 300 MG/ML  SOLN  Comparison: PET scan 12/18/2012  Findings: There is no evidence for acute infarction, intracranial hemorrhage, mass lesion, hydrocephalus, or extra-axial fluid.  Mild atrophy is present.  There is mild chronic microvascular ischemic change.  Dystrophic calcification noted  right anterior frontal gyrus and left inferior temporal gyrus, likely old ischemic/traumatic/inflammatory insult.  No signs of surrounding edema to suggest acute process.  The calvarium is intact.  There are no definite osseous lytic or sclerotic lesions.  Scattered lucencies in the calvarium are suspected to represent venous lakes (see   image 20 series 3).  Advanced vascular calcification.  Negative orbits, paranasal sinuses, and mastoids.  Post infusion, there is no abnormal enhancement of the brain or meninges.  No CT signs of proximal vascular thrombosis.  IMPRESSION: Atrophy and chronic microvascular ischemic change.  No definite intracranial metastatic disease.  Please note that MRI with 3T scanning is more sensitive,  if there are no contraindications.  Indeterminate, but non-acute remote insults resulting in dystrophic calcification as described above.  Chronic atherosclerotic change of the intracranial vasculature.  No definite osseous lytic lesions.   Original Report Authenticated By: Davonna Belling, M.D.   Nm Pet Image Initial (pi) Skull Base To Thigh  12/18/2012   *RADIOLOGY REPORT*  Clinical Data: Initial treatment strategy for lung mass.  NUCLEAR MEDICINE PET SKULL BASE TO THIGH  Fasting Blood Glucose:  101   Technique:  19.6 mCi F-18 FDG was injected intravenously. CT data was obtained and used for attenuation correction and anatomic localization only.  (This was not acquired as a diagnostic CT examination.) Additional exam technical data entered on technologist worksheet.  Comparison:  CT chest 12/11/2012.  Findings:  Neck: No hypermetabolic lymph nodes in the neck.  CT images show no acute findings.  Chest:  Hypermetabolic bilateral supraclavicular adenopathy with an S U V max of 14.0 on the right.  Hypermetabolic adenopathy extends into the mediastinum bilaterally.  There are two somewhat ill- defined nodal lesions in the AP window which collectively, have an S U V max 21.2.  Hypermetabolic adenopathy extends inferiorly into the subcarinal station.  Spiculated left upper lobe mass measures 2.5 x 3.5 cm and has an S U V max of 17.0.  A 10 mm nodule is seen in the posterior aspect of the left upper lobe, with an S U V max of 3.2.  There are additional scattered pulmonary nodules bilaterally, some of which do show mild hypermetabolism.  No additional areas of abnormal hypermetabolism in the chest. Moderate pericardial effusion persists.  No pleural fluid.  Abdomen/Pelvis:  The left adrenal gland may be minimally nodular in appearance and is hypermetabolic, with an S U V max of 9.7.  No additional areas of abnormal hypermetabolism in the abdomen or pelvis.  CT images show no acute findings.  Left renal stone or renal vascular calcification.  Infrarenal aortic aneurysm measures 7.1 x 7.5 cm and ends above the bifurcation.  There are bilateral inguinal hernias, containing fat on the right and unobstructed colon on the left.  Skeleton:  No focal hypermetabolic activity to suggest skeletal metastasis.  IMPRESSION:  1.  Left upper lobe mass with extensive hypermetabolic supraclavicular/mediastinal adenopathy, bilateral pulmonary nodules (some of which are hypermetabolic) and a hypermetabolic nodular appearing left adrenal  gland.  Collectively, findings are most consistent with stage IV primary bronchogenic carcinoma. 2.  Large infrarenal aortic aneurysm. 3.  Moderate pericardial effusion, stable.   Original Report Authenticated By: Leanna Battles, M.D.   US Biopsy  12/25/2012   *RADIOLOGY REPORT*  Clinical history:79 year old with a left lung mass and evidence for metastatic disease.  The patient has supraclavicular lymphadenopathy.  The patient needs a tissue diagnosis.  PROCEDURE(S): ULTRASOUND GUIDED RIGHT SUPRACLAVICULAR LYMPH NODE BIOPSY  Physician: Rachelle Hora. Henn, MD  Medications:Versed 1.5 mg, Fentanyl 50 mcg. A radiology nurse monitored the patient for moderate sedation.  Moderate sedation time:20 minutes  Fluoroscopy time: None  Procedure:The procedure was explained to the patient.  The risks and benefits of the procedure were discussed and the patient's questions were addressed.  Informed consent was obtained from the patient.  The neck was evaluated with ultrasound.  Lymph nodes in the right supraclavicular region were selected for biopsy.  The right side of the neck was prepped and draped in a sterile fashion. Skin was anesthetized with lidocaine.  18 gauge core biopsy needle was directed into a conglomeration of lymph nodes in the right supraclavicular region with ultrasound guidance. The nodes are lateral to the right internal jugular vein.  A total of four core biopsies were obtained.  Specimens were placed in saline.  Findings:Multiple enlarged supraclavicular lymph nodes bilaterally. Small conglomeration of lymph nodes in the right supraclavicular region were biopsied.  No significant bleeding following the core biopsies.  Complications: None  Impression:Successful ultrasound guided core biopsies of lymph nodes in the right supraclavicular region.   Original Report Authenticated By: Richarda Overlie, M.D.    ASSESSMENT AND PLAN: this is a very pleasant 79 years old white male recently diagnosed with metastatic non-small  cell lung cancer, adenocarcinoma with extensive bilateral supraclavicular, mediastinal and bilateral pulmonary nodules.  He is currently underwent palliative radiotherapy to the extensive mediastinal lymphadenopathy under the care of Dr. Kathrynn Running. The molecular study for EGFR mutation as well as ALK gene translocation were negative. I have a lengthy discussion with the patient and his family today about his current disease status and treatment options. I gave the patient the option of palliative care versus proceeding with palliative chemotherapy with carboplatin and Alimta. The patient is interested in proceeding with the chemotherapy.  I recommended for him regimen consisting of carboplatin for AUC of 5and Alimta 500 mg/M2 every 3 weeks. I discussed with the patient adverse effect of this treatment including but not limited to mild alopecia, myelosuppression, nausea and vomiting, peripheral neuropathy, liver or renal dysfunction. I will arrange for the patient to have a chemotherapy medication class before starting the first cycle of his treatment next week. The patient will receive vitamin B12 injection today. I would call his pharmacy with prescription for Compazine 10 mg by mouth every 6 hours as needed for nausea, folic acid 1 mg by mouth daily as well as Decadron 4 mg by mouth twice a day the day before, day of and day after the chemotherapy. He is expected to start the first cycle of his treatment on 01/21/2013. The patient would come back for follow up visit in 2 weeks for reevaluation and management any adverse effect of his treatment. I gave the patient and his family the time to ask questions and I answered them completely to their satisfaction.  The patient voices understanding of current disease status and treatment options and is in agreement with the current care plan.  All questions were answered. The patient knows to call the clinic with any problems, questions or concerns. We can  certainly see the patient much sooner if necessary.  I spent 30 minutes counseling the patient face to face. The total time spent in the appointment was 45 minutes.

## 2013-01-13 NOTE — Progress Notes (Signed)
Late entry from 01/09/2013. Oriented patient to staff and routine of the clinic. Provided patient with RADIATION THERAPY AND YOU handbook then, reviewed pertinent information. Educated patient reference potential side effects and management such as, fatigue and skin changes. Provided patient with radiaplex gel and directed upon use. All questions answered. Encouraged patient to contact staff with future needs. Patient verbalized understanding of all reviewed.

## 2013-01-13 NOTE — Telephone Encounter (Signed)
Per staff message and POF I have scheduled appts.  JMW  

## 2013-01-13 NOTE — Patient Instructions (Signed)
CHEMOTHERAPY INTENT: Palliative  CURRENT # OF CHEMOTHERAPY CYCLES: 0  CURRENT ANTIEMETICS: Zofran, dexamethasone and Compazine  CURRENT SMOKING STATUS: currently a nonsmoker  ORAL CHEMOTHERAPY AND CONSENT: None  CURRENT BISPHOSPHONATES USE: None  PAIN MANAGEMENT: No pain  NARCOTICS INDUCED CONSTIPATION: N/A

## 2013-01-14 ENCOUNTER — Telehealth: Payer: Self-pay | Admitting: *Deleted

## 2013-01-14 ENCOUNTER — Ambulatory Visit
Admission: RE | Admit: 2013-01-14 | Discharge: 2013-01-14 | Disposition: A | Discharge status: Home / Self Care | Payer: Medicare Other | Source: Ambulatory Visit | Attending: Radiation Oncology | Admitting: Radiation Oncology

## 2013-01-14 ENCOUNTER — Other Ambulatory Visit: Payer: Self-pay | Admitting: Internal Medicine

## 2013-01-14 ENCOUNTER — Telehealth: Payer: Self-pay | Admitting: Internal Medicine

## 2013-01-14 NOTE — Telephone Encounter (Signed)
Per staff message I have moved appt from 8/6 to 8/7

## 2013-01-14 NOTE — Telephone Encounter (Signed)
Per staff message and POF I have scheduled appts.  JMW  

## 2013-01-14 NOTE — Telephone Encounter (Signed)
returned pt call and r/s all appts to thursday per pt request...lvm confirm change with d/t

## 2013-01-15 ENCOUNTER — Ambulatory Visit
Admission: RE | Admit: 2013-01-15 | Discharge: 2013-01-15 | Disposition: A | Discharge status: Home / Self Care | Payer: Medicare Other | Source: Ambulatory Visit | Attending: Radiation Oncology | Admitting: Radiation Oncology

## 2013-01-15 ENCOUNTER — Telehealth: Payer: Self-pay | Admitting: Medical Oncology

## 2013-01-15 ENCOUNTER — Other Ambulatory Visit: Payer: Medicare Other

## 2013-01-15 ENCOUNTER — Encounter: Payer: Self-pay | Admitting: *Deleted

## 2013-01-15 NOTE — Telephone Encounter (Addendum)
Should he start folic acid ? I called pt back and todl him to start folic acid and reveiwed dexamethasone rx. directions.

## 2013-01-16 ENCOUNTER — Encounter: Payer: Self-pay | Admitting: Radiation Oncology

## 2013-01-16 ENCOUNTER — Ambulatory Visit
Admission: RE | Admit: 2013-01-16 | Discharge: 2013-01-16 | Disposition: A | Discharge status: Home / Self Care | Payer: Medicare Other | Source: Ambulatory Visit | Attending: Radiation Oncology | Admitting: Radiation Oncology

## 2013-01-16 VITALS — BP 133/89 | HR 88 | Resp 18 | Wt 184.9 lb

## 2013-01-16 DIAGNOSIS — C3492 Malignant neoplasm of unspecified part of left bronchus or lung: Secondary | ICD-10-CM

## 2013-01-16 NOTE — Progress Notes (Signed)
Complains of worse thickened sputum. Encouraged patient to begin using hydrogen peroxide/water mixture as details in RADIATION THERAPY AND YOU handbook. Patient complains hoarseness and shortness of breath are worse. Faint hyperpigmentation of chest noted. Reports using radiaplex bid as directed.

## 2013-01-16 NOTE — Progress Notes (Signed)
   Department of Radiation Oncology  Phone:  [phone] Fax:        [phone]  Weekly Treatment Note    Name: Tyler [name] Date: 01/16/2013 MRN: [id number] DOB: 1932-12-04   Current dose: 17.5 Gy  Current fraction: 7   MEDICATIONS: Current Outpatient Prescriptions  Medication Sig Dispense Refill  . cholecalciferol (VITAMIN D) 1000 UNITS tablet Take 1,000 Units by mouth daily.      Marland Kitchen dexamethasone (DECADRON) 4 MG tablet 4 mg by mouth twice a day the day before, day of and day after the chemotherapy every 3 weeks  60 tablet  1  . diltiazem (DILACOR XR) 240 MG 24 hr capsule TAKE ONE CAPSULE BY MOUTH EVERY DAY  90 capsule  1  . fluticasone (FLONASE) 50 MCG/ACT nasal spray       . folic acid (FOLVITE) 1 MG tablet Take 1 tablet (1 mg total) by mouth daily.  30 tablet  4  . guaiFENesin (MUCINEX) 600 MG 12 hr tablet Take 600 mg by mouth 2 (two) times daily as needed for congestion.      Marland Kitchen lisinopril (PRINIVIL,ZESTRIL) 10 MG tablet Take 10 mg by mouth as needed. Increased blood pressure.      . loratadine (CLARITIN) 10 MG tablet Take 10 mg by mouth daily.      Marland Kitchen LORazepam (ATIVAN) 1 MG tablet Take 1 tablet (1 mg total) by mouth every 8 (eight) hours.  20 tablet  0  . multivitamin-iron-minerals-folic acid (CENTRUM) chewable tablet Chew 1 tablet by mouth daily.      . polyethylene glycol (MIRALAX / GLYCOLAX) packet Take 17 g by mouth daily as needed. Constipation.      . prochlorperazine (COMPAZINE) 10 MG tablet Take 1 tablet (10 mg total) by mouth every 6 (six) hours as needed.  60 tablet  0  . Tamsulosin HCl (FLOMAX) 0.4 MG CAPS Take 1 capsule (0.4 mg total) by mouth daily after supper.  30 capsule  6  . tiotropium (SPIRIVA HANDIHALER) 18 MCG inhalation capsule Place 1 capsule (18 mcg total) into inhaler and inhale daily.  20 capsule  0  . Wound Cleansers (RADIAPLEX EX) Apply topically.       No current facility-administered medications for this encounter.     ALLERGIES:  Vicodin   LABORATORY DATA:  Lab Results  Component Value Date   WBC 8.1 01/13/2013   HGB 14.0 01/13/2013   HCT 41.1 01/13/2013   MCV 87.9 01/13/2013   PLT 179 01/13/2013   Lab Results  Component Value Date   NA 125* 01/13/2013   K 4.4 01/13/2013   CL 95* 12/10/2012   CO2 24 01/13/2013   Lab Results  Component Value Date   ALT 14 01/13/2013   AST 14 01/13/2013   ALKPHOS 49 01/13/2013   BILITOT 0.89 01/13/2013     NARRATIVE: Tyler [name] was seen today for weekly treatment management. The chart was checked and the patient's films were reviewed. The patient has been doing satisfactorily with treatment. No major complaints. He does complain of some continued productive cough.  PHYSICAL EXAMINATION: weight is 184 lb 14.4 oz (83.87 kg). His blood pressure is 133/89 and his pulse is 88. His respiration is 18 and oxygen saturation is 96%.        ASSESSMENT: The patient is doing satisfactorily with treatment.  PLAN: We will continue with the patient's radiation treatment as planned. The patient will begin using Mucinex.

## 2013-01-19 ENCOUNTER — Ambulatory Visit (INDEPENDENT_AMBULATORY_CARE_PROVIDER_SITE_OTHER): Payer: Medicare Other | Admitting: Pulmonary Disease

## 2013-01-19 ENCOUNTER — Ambulatory Visit
Admission: RE | Admit: 2013-01-19 | Discharge: 2013-01-19 | Disposition: A | Discharge status: Home / Self Care | Payer: Medicare Other | Source: Ambulatory Visit | Attending: Radiation Oncology | Admitting: Radiation Oncology

## 2013-01-19 ENCOUNTER — Encounter: Payer: Self-pay | Admitting: Pulmonary Disease

## 2013-01-19 VITALS — BP 108/70 | HR 90 | Ht 68.0 in | Wt 186.2 lb

## 2013-01-19 DIAGNOSIS — C3490 Malignant neoplasm of unspecified part of unspecified bronchus or lung: Secondary | ICD-10-CM

## 2013-01-19 DIAGNOSIS — R05 Cough: Secondary | ICD-10-CM

## 2013-01-19 DIAGNOSIS — C3492 Malignant neoplasm of unspecified part of left bronchus or lung: Secondary | ICD-10-CM

## 2013-01-19 DIAGNOSIS — J449 Chronic obstructive pulmonary disease, unspecified: Secondary | ICD-10-CM

## 2013-01-19 MED ORDER — RADIAPLEXRX EX GEL
Freq: Once | CUTANEOUS | Status: AC
  Administered 2013-01-19: 13:00:00 via TOPICAL

## 2013-01-19 MED ORDER — TRAMADOL HCL 50 MG PO TABS: 50.0000 mg | ORAL_TABLET | Freq: Four times a day (QID) | ORAL | Status: DC | PRN

## 2013-01-19 NOTE — Assessment & Plan Note (Signed)
He continues with radiation therapy treatments. The plan is for him to start chemotherapy this week. I appreciate the excellent care provided by the cancer center.

## 2013-01-19 NOTE — Assessment & Plan Note (Signed)
Cough is been a significant problem for him since starting radiation therapy. I think that it is do to both his COPD, as well as laryngeal irritation from ongoing cough. There is likely a component of inflammation related to his radiation therapy. He prefers to minimize medicines for this condition is much as possible  Plan: -Recommended hard candy and warm beverages to help soothe the throat -Elevate head of bed to eliminate any component of reflux -Use over-the-counter Delsym and Mucinex as needed for the cough -I wrote a Prescription for tramadol to help with cough suppression as needed

## 2013-01-19 NOTE — Assessment & Plan Note (Signed)
Athan should continue taking the Spiriva.

## 2013-01-19 NOTE — Patient Instructions (Signed)
For your cough:  Keep using the Mucinex as needed Delsym is an over the counter cough suppressant that works well that you can try  When the cough is particularly bothersome, try using the tramadol 50mg  every six hours as needed  Prop the head of your bed up Use warm beverages and hard candies to sooth your throat  Keep taking the Spiriva  We will see you back in 6 weeks or sooner if needed

## 2013-01-19 NOTE — Progress Notes (Signed)
Subjective:    Patient ID: Tyler [name], male    DOB: February 07, 1933, 79 y.o.   MRN: [id number]  Synopsis: Mr. Pridgeon first saw the [place] pulmonary office in June of 2014. On that visit he was diagnosed with moderate COPD as well as stage IV non-small cell lung cancer. He started radiation therapy for his lung cancer in July 2014 and is due to start chemotherapy early August 2014.  HPI  01/19/2013 routine office visit>>  Zerek has been struggling with cough since the last visit. He feels that radiation therapy has been making this worse. He has frequent secretions throughout the day and the constant sensation that he needs to clear his throat. This is worse when he lies down at night. It is worse after radiation therapy. He is coughing spells frequently which are quite bothersome. Shortness of breath is about the same since the last visit. In general, he feels much weaker and much more fatigue since the last visit. He sleeps frequently during the day. He continues to take the Spiriva daily.  Past Medical History  Diagnosis Date  . BENIGN PROSTATIC HYPERTROPHY 07/23/2008  . 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 HIP PAIN, LEFT 07/04/2007  . HYPERLIPIDEMIA 01/02/2007  . HYPERTENSION 01/02/2007  . INSOMNIA 11/16/2008  . PROSTATE SPECIFIC ANTIGEN, ELEVATED 09/10/2008  . Lung cancer     metastatic non small cell lung cancer, adenocarcinoma      Review of Systems  Constitutional: Positive for fatigue. Negative for fever and chills.  HENT: Negative for nosebleeds, congestion, rhinorrhea and postnasal drip.   Respiratory: Positive for cough, shortness of breath and wheezing.   Cardiovascular: Negative for chest pain and leg swelling.       Objective:   Physical Exam  Filed Vitals:   01/19/13 1207  BP: 108/70  Pulse: 90  Height: 5\' 8"  (1.727 m)  Weight: 186 lb 3.2 oz (84.46 kg)  SpO2: 95%   RA  Gen: chronically ill appearing, no acute distress HEENT: NCAT, EOMi, OP clear,  PULM: Crackles  R base, diminished L base; normal bilateral air entry otherwise CV: RRR, no mgr, no JVD AB: BS+, soft, nontender, no hsm Ext: warm, no edema, no clubbing, no cyanosis       Assessment & Plan:   COPD, moderate Cyan should continue taking the Spiriva.  Cough Cough is been a significant problem for him since starting radiation therapy. I think that it is do to both his COPD, as well as laryngeal irritation from ongoing cough. There is likely a component of inflammation related to his radiation therapy. He prefers to minimize medicines for this condition is much as possible  Plan: -Recommended hard candy and warm beverages to help soothe the throat -Elevate head of bed to eliminate any component of reflux -Use over-the-counter Delsym and Mucinex as needed for the cough -I wrote a Prescription for tramadol to help with cough suppression as needed  Non-small cell carcinoma of lung He continues with radiation therapy treatments. The plan is for him to start chemotherapy this week. I appreciate the excellent care provided by the cancer center.   Updated Medication List Outpatient Encounter Prescriptions as of 01/19/2013  Medication Sig Dispense Refill  . cholecalciferol (VITAMIN D) 1000 UNITS tablet Take 1,000 Units by mouth daily.      Marland Kitchen dexamethasone (DECADRON) 4 MG tablet 4 mg by mouth twice a day the day before, day of and day after the chemotherapy every 3 weeks  60 tablet  1  .  diltiazem (DILACOR XR) 240 MG 24 hr capsule TAKE ONE CAPSULE BY MOUTH EVERY DAY  90 capsule  1  . folic acid (FOLVITE) 1 MG tablet Take 1 tablet (1 mg total) by mouth daily.  30 tablet  4  . guaiFENesin (MUCINEX) 600 MG 12 hr tablet Take 600 mg by mouth 2 (two) times daily as needed for congestion.      Marland Kitchen lisinopril (PRINIVIL,ZESTRIL) 10 MG tablet Take 10 mg by mouth as needed. Increased blood pressure.      . loratadine (CLARITIN) 10 MG tablet Take 10 mg by mouth daily.      Marland Kitchen LORazepam (ATIVAN) 1 MG tablet  Take 1 tablet (1 mg total) by mouth every 8 (eight) hours.  20 tablet  0  . multivitamin-iron-minerals-folic acid (CENTRUM) chewable tablet Chew 1 tablet by mouth daily.      . polyethylene glycol (MIRALAX / GLYCOLAX) packet Take 17 g by mouth daily as needed. Constipation.      . prochlorperazine (COMPAZINE) 10 MG tablet Take 10 mg by mouth every 6 (six) hours as needed (for nausea).      . Tamsulosin HCl (FLOMAX) 0.4 MG CAPS Take 1 capsule (0.4 mg total) by mouth daily after supper.  30 capsule  6  . tiotropium (SPIRIVA HANDIHALER) 18 MCG inhalation capsule Place 1 capsule (18 mcg total) into inhaler and inhale daily.  20 capsule  0  . Wound Cleansers (RADIAPLEX EX) Apply topically.      . [DISCONTINUED] prochlorperazine (COMPAZINE) 10 MG tablet Take 1 tablet (10 mg total) by mouth every 6 (six) hours as needed.  60 tablet  0  . traMADol (ULTRAM) 50 MG tablet Take 1 tablet (50 mg total) by mouth every 6 (six) hours as needed (cough).  40 tablet  0  . [DISCONTINUED] fluticasone (FLONASE) 50 MCG/ACT nasal spray        No facility-administered encounter medications on file as of 01/19/2013.

## 2013-01-19 NOTE — Addendum Note (Signed)
Encounter addended by: Agnes Lawrence, RN on: 01/19/2013  1:04 PM<BR>     Documentation filed: Inpatient MAR, Orders

## 2013-01-20 ENCOUNTER — Ambulatory Visit
Admission: RE | Admit: 2013-01-20 | Discharge: 2013-01-20 | Disposition: A | Discharge status: Home / Self Care | Payer: Medicare Other | Source: Ambulatory Visit | Attending: Radiation Oncology | Admitting: Radiation Oncology

## 2013-01-20 ENCOUNTER — Other Ambulatory Visit: Payer: Medicare Other | Admitting: Lab

## 2013-01-20 ENCOUNTER — Encounter: Payer: Self-pay | Admitting: Radiation Oncology

## 2013-01-20 VITALS — BP 138/82 | HR 90 | Temp 97.4°F | Resp 16 | Wt 183.1 lb

## 2013-01-20 DIAGNOSIS — C3492 Malignant neoplasm of unspecified part of left bronchus or lung: Secondary | ICD-10-CM

## 2013-01-20 MED ORDER — SUCRALFATE 1 G PO TABS: 1.0000 g | ORAL_TABLET | Freq: Four times a day (QID) | ORAL | Status: DC

## 2013-01-20 NOTE — Progress Notes (Signed)
Patient requesting to see Dr. Kathrynn Running. Patient complains his sputum is more dense. Reports dry cough is worse. Reports hoarseness is worse. Reports difficulty sleeping due to cough and inability to cough up thick sputum. Reports fatigue is worse. Wife reports he feels chilled or cold constantly.

## 2013-01-20 NOTE — Progress Notes (Signed)
  Radiation Oncology         [phone] ________________________________  Name: Tyler [name] MRN: [id number]  Date: 01/20/2013  DOB: 1933/03/26  Weekly Radiation Therapy Management  Current Dose: 20 Gy     Planned Dose:  35 Gy  Narrative . . . . . . . . The patient presents for routine under treatment assessment.                                                    The patient is complaining .                                 Set-up films were reviewed.                                 The chart was checked. Physical Findings. . .  weight is 183 lb 1.6 oz (83.054 kg). His oral temperature is 97.4 F (36.3 C). His blood pressure is 138/82 and his pulse is 90. His respiration is 16 and oxygen saturation is 97%. . Weight essentially stable.  No significant changes. Impression . . . . . . . The patient is  tolerating radiation. Plan . . . . . . . . . . . . Continue treatment as planned.  Given Carafate.  ________________________________  Artist Pais Kathrynn Running, M.D.

## 2013-01-21 ENCOUNTER — Ambulatory Visit
Admission: RE | Admit: 2013-01-21 | Discharge: 2013-01-21 | Disposition: A | Discharge status: Home / Self Care | Payer: Medicare Other | Source: Ambulatory Visit | Attending: Radiation Oncology | Admitting: Radiation Oncology

## 2013-01-21 ENCOUNTER — Telehealth: Payer: Self-pay | Admitting: Pulmonary Disease

## 2013-01-21 ENCOUNTER — Other Ambulatory Visit: Payer: Medicare Other | Admitting: Lab

## 2013-01-21 ENCOUNTER — Other Ambulatory Visit: Payer: Self-pay

## 2013-01-21 ENCOUNTER — Ambulatory Visit: Payer: Medicare Other

## 2013-01-21 NOTE — Telephone Encounter (Signed)
Spoke with patients significant other [name]-- Patient seen by Dr. Kendrick Fries 8/4: Patient Instructions    For your cough:  Keep using the Mucinex as needed  Delsym is an over the counter cough suppressant that works well that you can try  When the cough is particularly bothersome, try using the tramadol 50mg  every six hours as needed  Prop the head of your bed up  Use warm beverages and hard candies to sooth your throat  Keep taking the Spiriva  We will see you back in 6 weeks or sooner if needed   ----------------------- [name] states patient 1/3 tablet of Tramadol about 5pm last night then the other 2/3 tab about 9pm and went to sleep Patient then woke up about 2 am and per [name] first stated "I feel good" Next stated "I feel light headed" and finally said "i feel strange"  [name] concerned if this is normal? Is this a reaction to the tramadol? Patient has not taken any more tramadol since 9pm and per natalie is doing ok but very tired since he was up at 2am this morning and couldn't really settle back down  [name] is aware BQ is off and msg will be addressed by Dr. Vassie Loll Dr. Vassie Loll please advise thank you    Current Outpatient Prescriptions on File Prior to Visit  Medication Sig Dispense Refill  . cholecalciferol (VITAMIN D) 1000 UNITS tablet Take 1,000 Units by mouth daily.      Marland Kitchen dexamethasone (DECADRON) 4 MG tablet 4 mg by mouth twice a day the day before, day of and day after the chemotherapy every 3 weeks  60 tablet  1  . diltiazem (DILACOR XR) 240 MG 24 hr capsule TAKE ONE CAPSULE BY MOUTH EVERY DAY  90 capsule  1  . folic acid (FOLVITE) 1 MG tablet Take 1 tablet (1 mg total) by mouth daily.  30 tablet  4  . guaiFENesin (MUCINEX) 600 MG 12 hr tablet Take 600 mg by mouth 2 (two) times daily as needed for congestion.      Marland Kitchen lisinopril (PRINIVIL,ZESTRIL) 10 MG tablet Take 10 mg by mouth as needed. Increased blood pressure.      . loratadine (CLARITIN) 10 MG tablet Take 10 mg  by mouth daily.      Marland Kitchen LORazepam (ATIVAN) 1 MG tablet Take 1 tablet (1 mg total) by mouth every 8 (eight) hours.  20 tablet  0  . multivitamin-iron-minerals-folic acid (CENTRUM) chewable tablet Chew 1 tablet by mouth daily.      . polyethylene glycol (MIRALAX / GLYCOLAX) packet Take 17 g by mouth daily as needed. Constipation.      . prochlorperazine (COMPAZINE) 10 MG tablet Take 10 mg by mouth every 6 (six) hours as needed (for nausea).      . sucralfate (CARAFATE) 1 G tablet Take 1 tablet (1 g total) by mouth 4 (four) times daily. 5 min before food for esophagitis, grind pill into water  90 tablet  2  . Tamsulosin HCl (FLOMAX) 0.4 MG CAPS Take 1 capsule (0.4 mg total) by mouth daily after supper.  30 capsule  6  . tiotropium (SPIRIVA HANDIHALER) 18 MCG inhalation capsule Place 1 capsule (18 mcg total) into inhaler and inhale daily.  20 capsule  0  . traMADol (ULTRAM) 50 MG tablet Take 1 tablet (50 mg total) by mouth every 6 (six) hours as needed (cough).  40 tablet  0  . Wound Cleansers (RADIAPLEX EX) Apply topically.  No current facility-administered medications on file prior to visit.

## 2013-01-21 NOTE — Telephone Encounter (Signed)
Side effect of tramadol - can stop BQ to address if something else needed

## 2013-01-21 NOTE — Telephone Encounter (Signed)
Spoke with [name]-- Made her aware to stop tramadol Also advised that for further questions we would have to direct to Dr. Kendrick Fries when hes available [name] verbalized understanding Nothing further needed at this time

## 2013-01-22 ENCOUNTER — Other Ambulatory Visit: Payer: Self-pay | Admitting: Medical Oncology

## 2013-01-22 ENCOUNTER — Ambulatory Visit (HOSPITAL_BASED_OUTPATIENT_CLINIC_OR_DEPARTMENT_OTHER): Payer: Medicare Other

## 2013-01-22 ENCOUNTER — Other Ambulatory Visit (HOSPITAL_BASED_OUTPATIENT_CLINIC_OR_DEPARTMENT_OTHER): Payer: Medicare Other | Admitting: Lab

## 2013-01-22 ENCOUNTER — Ambulatory Visit
Admission: RE | Admit: 2013-01-22 | Discharge: 2013-01-22 | Disposition: A | Discharge status: Home / Self Care | Payer: Medicare Other | Source: Ambulatory Visit | Attending: Radiation Oncology | Admitting: Radiation Oncology

## 2013-01-22 ENCOUNTER — Other Ambulatory Visit: Payer: Self-pay | Admitting: Internal Medicine

## 2013-01-22 VITALS — BP 131/87 | HR 84 | Temp 97.8°F

## 2013-01-22 DIAGNOSIS — Z5111 Encounter for antineoplastic chemotherapy: Secondary | ICD-10-CM

## 2013-01-22 DIAGNOSIS — C3490 Malignant neoplasm of unspecified part of unspecified bronchus or lung: Secondary | ICD-10-CM

## 2013-01-22 LAB — CBC WITH DIFFERENTIAL/PLATELET
BASO%: 0.1 % (ref 0.0–2.0)
EOS%: 0 % (ref 0.0–7.0)
HCT: 38.3 % — ABNORMAL LOW (ref 38.4–49.9)
MCH: 29.7 pg (ref 27.2–33.4)
MCHC: 35.8 g/dL (ref 32.0–36.0)
NEUT%: 89.9 % — ABNORMAL HIGH (ref 39.0–75.0)
RBC: 4.61 10*6/uL (ref 4.20–5.82)
WBC: 11 10*3/uL — ABNORMAL HIGH (ref 4.0–10.3)
lymph#: 0.4 10*3/uL — ABNORMAL LOW (ref 0.9–3.3)
nRBC: 0 % (ref 0–0)

## 2013-01-22 LAB — COMPREHENSIVE METABOLIC PANEL (CC13)
ALT: 13 U/L (ref 0–55)
AST: 11 U/L (ref 5–34)
Albumin: 3.8 g/dL (ref 3.5–5.0)
Alkaline Phosphatase: 48 U/L (ref 40–150)
BUN: 22.1 mg/dL (ref 7.0–26.0)
Creatinine: 1.4 mg/dL — ABNORMAL HIGH (ref 0.7–1.3)
Potassium: 4.6 mEq/L (ref 3.5–5.1)

## 2013-01-22 MED ORDER — DEXAMETHASONE SODIUM PHOSPHATE 20 MG/5ML IJ SOLN
20.0000 mg | Freq: Once | INTRAMUSCULAR | Status: AC
  Administered 2013-01-22: 20 mg via INTRAVENOUS

## 2013-01-22 MED ORDER — SODIUM CHLORIDE 0.9 % IV SOLN
500.0000 mg/m2 | Freq: Once | INTRAVENOUS | Status: AC
  Administered 2013-01-22: 1000 mg via INTRAVENOUS
  Filled 2013-01-22: qty 40

## 2013-01-22 MED ORDER — ONDANSETRON 16 MG/50ML IVPB (CHCC)
16.0000 mg | Freq: Once | INTRAVENOUS | Status: AC
  Administered 2013-01-22: 16 mg via INTRAVENOUS

## 2013-01-22 MED ORDER — ONDANSETRON 4 MG PO TBDP: 4.0000 mg | ORAL_TABLET | Freq: Three times a day (TID) | ORAL | Status: DC | PRN

## 2013-01-22 MED ORDER — SODIUM CHLORIDE 0.9 % IV SOLN
Freq: Once | INTRAVENOUS | Status: AC
  Administered 2013-01-22: 13:00:00 via INTRAVENOUS

## 2013-01-22 MED ORDER — SODIUM CHLORIDE 0.9 % IV SOLN
389.0000 mg | Freq: Once | INTRAVENOUS | Status: AC
  Administered 2013-01-22: 390 mg via INTRAVENOUS
  Filled 2013-01-22: qty 39

## 2013-01-22 NOTE — Progress Notes (Signed)
Patient stated he did not want pain medication for his shoulder.  He stated it felt much better after we changed his position. Patient discharged ambulatory with son and [name].

## 2013-01-22 NOTE — Patient Instructions (Addendum)
[facility] Discharge Instructions for Patients Receiving Chemotherapy  Today you received the following chemotherapy agents alimta and carboplatin.  To help prevent nausea and vomiting after your treatment, we encourage you to take your nausea medication.   If you develop nausea and vomiting that is not controlled by your nausea medication, call the clinic.   BELOW ARE SYMPTOMS THAT SHOULD BE REPORTED IMMEDIATELY:  *FEVER GREATER THAN 100.5 F  *CHILLS WITH OR WITHOUT FEVER  NAUSEA AND VOMITING THAT IS NOT CONTROLLED WITH YOUR NAUSEA MEDICATION  *UNUSUAL SHORTNESS OF BREATH  *UNUSUAL BRUISING OR BLEEDING  TENDERNESS IN MOUTH AND THROAT WITH OR WITHOUT PRESENCE OF ULCERS  *URINARY PROBLEMS  *BOWEL PROBLEMS  UNUSUAL RASH Items with * indicate a potential emergency and should be followed up as soon as possible.  Feel free to call the clinic you have any questions or concerns. The clinic phone number is [phone].

## 2013-01-22 NOTE — Telephone Encounter (Signed)
zofran ODT e-scribed

## 2013-01-23 ENCOUNTER — Telehealth: Payer: Self-pay | Admitting: *Deleted

## 2013-01-23 ENCOUNTER — Ambulatory Visit
Admission: RE | Admit: 2013-01-23 | Discharge: 2013-01-23 | Disposition: A | Discharge status: Home / Self Care | Payer: Medicare Other | Source: Ambulatory Visit | Attending: Radiation Oncology | Admitting: Radiation Oncology

## 2013-01-23 ENCOUNTER — Other Ambulatory Visit: Payer: Self-pay | Admitting: *Deleted

## 2013-01-23 ENCOUNTER — Encounter: Payer: Self-pay | Admitting: Radiation Oncology

## 2013-01-23 VITALS — BP 124/72 | HR 85 | Resp 18 | Wt 183.6 lb

## 2013-01-23 DIAGNOSIS — C3492 Malignant neoplasm of unspecified part of left bronchus or lung: Secondary | ICD-10-CM

## 2013-01-23 MED ORDER — ONDANSETRON 4 MG PO TBDP: 4.0000 mg | ORAL_TABLET | Freq: Three times a day (TID) | ORAL | Status: DC | PRN

## 2013-01-23 MED ORDER — MAGIC MOUTHWASH W/LIDOCAINE: 10.0000 mL | Freq: Four times a day (QID) | ORAL | Status: DC | PRN

## 2013-01-23 MED ORDER — GUAIFENESIN-DM 100-10 MG/5ML PO SYRP: 5.0000 mL | ORAL_SOLUTION | Freq: Four times a day (QID) | ORAL | Status: DC | PRN

## 2013-01-23 NOTE — Progress Notes (Addendum)
Mild hyperpigmentation of chest and back noted. Reports using radiaplex bid. Cough is non productive despite sounding wet. Reports painful and difficult swallowing despite using carafate. Reports fatigue.

## 2013-01-23 NOTE — Telephone Encounter (Signed)
Message copied by Augusto Garbe on Fri Jan 23, 2013 12:57 PM ------      Message from: Orbie Hurst      Created: Thu Jan 22, 2013  1:17 PM      Regarding: chemo follow up call      Contact: [phone]       Dr. Kerry Fort 1st alimta/carbo ------

## 2013-01-23 NOTE — Telephone Encounter (Signed)
Called Tyler [name] for chemotherapy F/U.  Patient reports nausea.  Has taken anti-emetic with no emesis.  Reports trouble swallowing and today Dr. Kathrynn Running gave him MMW because the carafate is too much work.  Reports he has a good team of care givers and he is listening to them.  Over all is doing well.  Denies any new side effects or symptoms.  Bowel and bladder is functioning well.  Eating and drinking but trouble swallowing pills with mouth pain.  I instructed to do salt water and baking soda gargles and soft toothbrush for mouth care.  Encouraged to drink 64 oz minimum daily or at least the day before, of and after treatment.  Denies questions at this time and encouraged to call if needed.  Reviewed how to call after hours in the case of an emergency.

## 2013-01-23 NOTE — Progress Notes (Signed)
  Radiation Oncology         [phone] ________________________________  Name: Tyler [name] MRN: [id number]  Date: 01/23/2013  DOB: Jun 21, 1932  Weekly Radiation Therapy Management  Current Dose: 27.5 Gy     Planned Dose:  35 Gy  Narrative . . . . . . . . The patient presents for routine under treatment assessment.                                                     Mild hyperpigmentation of chest and back noted. Reports using radiaplex bid. Cough is non productive despite sounding wet. Reports painful and difficult swallowing despite using carafate. Reports fatigue                                 Set-up films were reviewed.                                 The chart was checked. Physical Findings. . .  weight is 183 lb 9.6 oz (83.28 kg). His blood pressure is 124/72 and his pulse is 85. His respiration is 18 and oxygen saturation is 99%. . Weight essentially stable.  No significant changes. Impression . . . . . . . The patient is  tolerating radiation. Plan . . . . . . . . . . . . Continue treatment as planned.  ________________________________  Artist Pais. Kathrynn Running, M.D.

## 2013-01-25 ENCOUNTER — Encounter (HOSPITAL_COMMUNITY): Payer: Self-pay

## 2013-01-25 ENCOUNTER — Emergency Department (HOSPITAL_COMMUNITY)
Admission: EM | Admit: 2013-01-25 | Discharge: 2013-01-25 | Disposition: A | Discharge status: Home / Self Care | Payer: Medicare Other | Attending: Emergency Medicine | Admitting: Emergency Medicine

## 2013-01-25 ENCOUNTER — Emergency Department (HOSPITAL_COMMUNITY): Payer: Medicare Other

## 2013-01-25 DIAGNOSIS — IMO0002 Reserved for concepts with insufficient information to code with codable children: Secondary | ICD-10-CM | POA: Insufficient documentation

## 2013-01-25 DIAGNOSIS — Z85118 Personal history of other malignant neoplasm of bronchus and lung: Secondary | ICD-10-CM | POA: Insufficient documentation

## 2013-01-25 DIAGNOSIS — N400 Enlarged prostate without lower urinary tract symptoms: Secondary | ICD-10-CM | POA: Insufficient documentation

## 2013-01-25 DIAGNOSIS — K4091 Unilateral inguinal hernia, without obstruction or gangrene, recurrent: Secondary | ICD-10-CM | POA: Insufficient documentation

## 2013-01-25 DIAGNOSIS — Z87891 Personal history of nicotine dependence: Secondary | ICD-10-CM | POA: Insufficient documentation

## 2013-01-25 DIAGNOSIS — Z862 Personal history of diseases of the blood and blood-forming organs and certain disorders involving the immune mechanism: Secondary | ICD-10-CM | POA: Insufficient documentation

## 2013-01-25 DIAGNOSIS — Z8739 Personal history of other diseases of the musculoskeletal system and connective tissue: Secondary | ICD-10-CM | POA: Insufficient documentation

## 2013-01-25 DIAGNOSIS — K5900 Constipation, unspecified: Secondary | ICD-10-CM

## 2013-01-25 DIAGNOSIS — Z87448 Personal history of other diseases of urinary system: Secondary | ICD-10-CM | POA: Insufficient documentation

## 2013-01-25 DIAGNOSIS — I10 Essential (primary) hypertension: Secondary | ICD-10-CM | POA: Insufficient documentation

## 2013-01-25 DIAGNOSIS — Z79899 Other long term (current) drug therapy: Secondary | ICD-10-CM | POA: Insufficient documentation

## 2013-01-25 DIAGNOSIS — Z8639 Personal history of other endocrine, nutritional and metabolic disease: Secondary | ICD-10-CM | POA: Insufficient documentation

## 2013-01-25 LAB — POCT I-STAT, CHEM 8
Creatinine, Ser: 1.6 mg/dL — ABNORMAL HIGH (ref 0.50–1.35)
Glucose, Bld: 104 mg/dL — ABNORMAL HIGH (ref 70–99)
Hemoglobin: 15.3 g/dL (ref 13.0–17.0)
Potassium: 4.4 mEq/L (ref 3.5–5.1)
TCO2: 25 mmol/L (ref 0–100)

## 2013-01-25 LAB — CBC
HCT: 41.1 % (ref 39.0–52.0)
Hemoglobin: 14.4 g/dL (ref 13.0–17.0)
MCH: 29.8 pg (ref 26.0–34.0)
MCHC: 35 g/dL (ref 30.0–36.0)

## 2013-01-25 MED ORDER — DOCUSATE SODIUM 250 MG PO CAPS: 250.0000 mg | ORAL_CAPSULE | Freq: Every day | ORAL | Status: DC

## 2013-01-25 MED ORDER — FLEET ENEMA 7-19 GM/118ML RE ENEM
1.0000 | ENEMA | Freq: Once | RECTAL | Status: AC
  Administered 2013-01-25: 08:00:00 via RECTAL
  Filled 2013-01-25: qty 1

## 2013-01-25 MED ORDER — POLYETHYLENE GLYCOL 3350 17 GM/SCOOP PO POWD: 17.0000 g | Freq: Two times a day (BID) | ORAL | Status: DC

## 2013-01-25 MED ORDER — SODIUM CHLORIDE 0.9 % IV BOLUS (SEPSIS)
1000.0000 mL | Freq: Once | INTRAVENOUS | Status: AC
  Administered 2013-01-25: 1000 mL via INTRAVENOUS

## 2013-01-25 MED ORDER — FLEET ENEMA 7-19 GM/118ML RE ENEM: 1.0000 | ENEMA | Freq: Once | RECTAL | Status: DC

## 2013-01-25 NOTE — ED Provider Notes (Signed)
CSN: [id number]     Arrival date & time 01/25/13  0501 History     First MD Initiated Contact with Patient 01/25/13 0537     Chief Complaint  Patient presents with  . Constipation   (Consider location/radiation/quality/duration/timing/severity/associated sxs/prior Treatment) HPI Comments: Patient is a 79 year old male with a history of metastatic non-small cell lung cancer, currently undergoing chemotherapy every 3 weeks, who presents for constipation x4 days. Patient states his last bowel movement was Wednesday. He admits to this bowel movement being less in amount than usual but normal in color and consistency. He attributes his constipation to his first chemotherapy session which was this past Thursday. Patient has been passing flatus intermittently over the last 4 days; last passed flatus last night. He denies any modifying factors of his symptoms as well as any associated fever, nausea, vomiting, abdominal pain, or urinary symptoms. Patient endorses a hx of a L inguinal hernia; has noticed that hernia is also not reducing as regularly as it usually does which he believes to be related to his constipation.  PCP - Dr. Amador Cunas. Oncologist - Dr. Arbutus Ped.  Patient is a 79 y.o. male presenting with constipation. The history is provided by the patient. No language interpreter was used.  Constipation Associated symptoms: no abdominal pain, no fever, no nausea and no vomiting     Past Medical History  Diagnosis Date  . BENIGN PROSTATIC HYPERTROPHY 07/23/2008  . 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 HIP PAIN, LEFT 07/04/2007  . HYPERLIPIDEMIA 01/02/2007  . HYPERTENSION 01/02/2007  . INSOMNIA 11/16/2008  . PROSTATE SPECIFIC ANTIGEN, ELEVATED 09/10/2008  . Lung cancer     metastatic non small cell lung cancer, adenocarcinoma    Past Surgical History  Procedure Laterality Date  . Transurethral resection of prostate  2004  . Total hip arthroplasty  2010, 2011    2010- left; 2011- right  .  Upper gastrointestinal endoscopy     Family History  Problem Relation Age of Onset  . Colon polyps Neg Hx   . Stomach cancer Neg Hx   . Colon cancer Neg Hx   . Cancer Neg Hx    History  Substance Use Topics  . Smoking status: Former Smoker -- 1.00 packs/day for 40 years    Types: Cigarettes    Quit date: 06/18/1998  . Smokeless tobacco: Never Used  . Alcohol Use: No    Review of Systems  Constitutional: Negative for fever.  Gastrointestinal: Positive for constipation. Negative for nausea, vomiting and abdominal pain.  All other systems reviewed and are negative.   Allergies  Vicodin  Home Medications   Current Outpatient Rx  Name  Route  Sig  Dispense  Refill  . Alum & Mag Hydroxide-Simeth (MAGIC MOUTHWASH W/LIDOCAINE) SOLN   Oral   Take 10 mLs by mouth 4 (four) times daily as needed (sore throat).         . cholecalciferol (VITAMIN D) 1000 UNITS tablet   Oral   Take 1,000 Units by mouth every morning.          Marland Kitchen dexamethasone (DECADRON) 4 MG tablet   Oral   Take 4 mg by mouth 2 (two) times daily. Take the day before, the day of, and the day after chemo treatment every 3 weeks         . diltiazem (DILACOR XR) 240 MG 24 hr capsule   Oral   Take 240 mg by mouth every morning.         Marland Kitchen  folic acid (FOLVITE) 1 MG tablet   Oral   Take 1 mg by mouth every morning.         Marland Kitchen guaiFENesin-dextromethorphan (ROBITUSSIN DM) 100-10 MG/5ML syrup   Oral   Take 5 mLs by mouth 4 (four) times daily as needed for cough.         Marland Kitchen lisinopril (PRINIVIL,ZESTRIL) 10 MG tablet   Oral   Take 10 mg by mouth daily as needed (increased blood pressure). Increased blood pressure.         . loratadine (CLARITIN) 10 MG tablet   Oral   Take 10 mg by mouth every morning.          Marland Kitchen LORazepam (ATIVAN) 1 MG tablet   Oral   Take 1 mg by mouth every 8 (eight) hours as needed for anxiety.         . multivitamin-iron-minerals-folic acid (CENTRUM) chewable tablet   Oral    Chew 1 tablet by mouth every morning.          . ondansetron (ZOFRAN-ODT) 4 MG disintegrating tablet   Oral   Take 4 mg by mouth every 8 (eight) hours as needed for nausea. place one tablet under the tongue every 8 hours prn nausea         . polyethylene glycol (MIRALAX / GLYCOLAX) packet   Oral   Take 17 g by mouth daily as needed (constipation). Constipation.         . Tamsulosin HCl (FLOMAX) 0.4 MG CAPS   Oral   Take 1 capsule (0.4 mg total) by mouth daily after supper.   30 capsule   6   . tiotropium (SPIRIVA HANDIHALER) 18 MCG inhalation capsule   Inhalation   Place 1 capsule (18 mcg total) into inhaler and inhale daily.   20 capsule   0   . Wound Cleansers (RADIAPLEX EX)   Apply externally   Apply 1 application topically daily.          Marland Kitchen docusate sodium (COLACE) 250 MG capsule   Oral   Take 1 capsule (250 mg total) by mouth daily.   10 capsule   0   . polyethylene glycol powder (GLYCOLAX/MIRALAX) powder   Oral   Take 17 g by mouth 2 (two) times daily. Until daily soft stools  OTC   255 g   0    BP 99/71  Pulse 93  Temp(Src) 98.1 F (36.7 C) (Oral)  Resp 19  Ht 5\' 8"  (1.727 m)  Wt 196 lb (88.905 kg)  BMI 29.81 kg/m2  SpO2 97%  Physical Exam  Nursing note and vitals reviewed. Constitutional: He is oriented to person, place, and time. He appears well-developed and well-nourished. No distress.  HENT:  Head: Normocephalic and atraumatic.  Eyes: Conjunctivae and EOM are normal. No scleral icterus.  Neck: Normal range of motion.  Cardiovascular: Normal rate, regular rhythm and normal heart sounds.   Pulmonary/Chest: Effort normal and breath sounds normal. No respiratory distress. He has no wheezes.  Abdominal: Soft. Bowel sounds are normal. He exhibits distension (mild) and mass (L inguinal hernia; nontender). There is no tenderness. There is no rebound and no guarding.  Musculoskeletal: Normal range of motion.  Neurological: He is alert and  oriented to person, place, and time.  Skin: Skin is warm and dry. No rash noted. He is not diaphoretic. No erythema. No pallor.  Psychiatric: He has a normal mood and affect. His behavior is normal.   ED Course  Procedures (including critical care time)  Labs Reviewed  POCT I-STAT, CHEM 8 - Abnormal; Notable for the following:    Sodium 129 (*)    Chloride 94 (*)    BUN 35 (*)    Creatinine, Ser 1.60 (*)    Glucose, Bld 104 (*)    All other components within normal limits  CBC  OCCULT BLOOD, POC DEVICE   Dg Abd Acute W/chest  01/25/2013   *RADIOLOGY REPORT*  Clinical Data: Abdominal pain and constipation for 5 days. Metastatic lung cancer.  ACUTE ABDOMEN SERIES (ABDOMEN 2 VIEW & CHEST 1 VIEW)  Comparison: 12/08/2012 chest x-ray.  Findings: Stable appearance of the chest, including spiculated left suprahilar nodule and a 13 mm irregularly shaped nodule at the right base.  Chronic elevation of the left diaphragm.  No acute infiltrate, edema, effusion, pneumothorax.  Stable heart size and mediastinal contours.  Stool and gas overlaps the left groin, compatible with inguinal hernia, noted on PET CT 12/18/2012.  No evidence of proximal obstruction. No pneumoperitoneum.  Left renal calcification which may represent stone or atherosclerosis based on prior CT.  Bilateral total hip arthroplasty.  Diffuse degenerative disc disease with dextroscoliosis.  IMPRESSION:  1.  Nonobstructive bowel gas pattern. 2.  Left inguinal hernia containing colon. 3.  No evidence of acute cardiopulmonary disease.   Original Report Authenticated By: Tiburcio Pea   1. Constipation   2. Inguinal hernia recurrent unilateral    MDM  7:15 - Patient presents for constipation x 4 days; passing flatus. Afebrile without nausea or vomiting. Physical exam without abdominal TTP or evidence of acute surgical abdomen. DG acute abdomen significant for nonobstructive bowel gas pattern; no evidence of proximal obstruction. Hernia  spontaneously reduced at bedside. Fleet enema ordered for symptoms. Will continue to monitor.  9:45 - Patient with small watery bowel movement in ED after receiving enema. States he feels much better since having had the enema. He is well and nontoxic appearing, in NAD. Believe patient appropriate for d/c with PCP follow up to ensure resolution of constipation symptoms. Rx for colace and Miralax provided and OTC fleet enemas advised. Patient also given list of high fiber foods and referral to [facility] for evaluation of hernia and to discuss inguinal hernia repair. Indications for ED return discussed with patient and family and both patient and family agreeable to d/c plan with no unaddressed concerns.    Antony Madura, PA-C 01/25/13 [phone]

## 2013-01-25 NOTE — ED Notes (Signed)
I have just received report and introduce myself to pt. And find him to be quiet and pensive, and in no distress.  I assist him to void at this time, which he does without problem.

## 2013-01-25 NOTE — ED Provider Notes (Signed)
Medical screening examination/treatment/procedure(s) were conducted as a shared visit with non-physician practitioner(s) and myself.  I personally evaluated the patient during the encounter and agree with physical exam and plan of care.  Pt is a 79 yo M with h/o NSCLC on chemo who presents to ED with constipation.  No BM in 4 days. No fever or vomiting.  Abdominal exam benign.  Pt has longstanding h/o L inguinal hernia that was easily reduced by pt in the ED.  Xray shows no free air or sign of obstruction.  Pt has been taking Miralax at home without relief.  Given fleet enema and had BM in ED and feels better.  Dc'ed home with return precautions, instructions to use Miralax, Colace, Fleets as needed.  Layla Maw Betty Daidone, DO 01/25/13 1453

## 2013-01-25 NOTE — ED Notes (Signed)
Pt reports no good bowel movement since Wednesday. Pt has tried stool softerners and miralax to help with movement but not successful. Pt denies gas or nausea. Pt states that he has a hernia that he is unable to put back in place due to stool.

## 2013-01-26 ENCOUNTER — Ambulatory Visit
Admission: RE | Admit: 2013-01-26 | Discharge: 2013-01-26 | Disposition: A | Discharge status: Home / Self Care | Payer: Medicare Other | Source: Ambulatory Visit | Attending: Radiation Oncology | Admitting: Radiation Oncology

## 2013-01-27 ENCOUNTER — Ambulatory Visit
Admission: RE | Admit: 2013-01-27 | Discharge: 2013-01-27 | Disposition: A | Discharge status: Home / Self Care | Payer: Medicare Other | Source: Ambulatory Visit | Attending: Radiation Oncology | Admitting: Radiation Oncology

## 2013-01-27 ENCOUNTER — Encounter: Payer: Self-pay | Admitting: Radiation Oncology

## 2013-01-28 ENCOUNTER — Ambulatory Visit
Admission: RE | Admit: 2013-01-28 | Discharge: 2013-01-28 | Disposition: A | Discharge status: Home / Self Care | Payer: Medicare Other | Source: Ambulatory Visit | Attending: Radiation Oncology | Admitting: Radiation Oncology

## 2013-01-28 ENCOUNTER — Ambulatory Visit: Payer: Medicare Other | Admitting: Physician Assistant

## 2013-01-28 ENCOUNTER — Other Ambulatory Visit: Payer: Medicare Other | Admitting: Lab

## 2013-01-28 ENCOUNTER — Other Ambulatory Visit: Payer: Medicare Other

## 2013-01-28 DIAGNOSIS — C3492 Malignant neoplasm of unspecified part of left bronchus or lung: Secondary | ICD-10-CM

## 2013-01-28 MED ORDER — RADIAPLEXRX EX GEL
Freq: Once | CUTANEOUS | Status: AC
  Administered 2013-01-28: 15:00:00 via TOPICAL

## 2013-01-28 NOTE — Progress Notes (Signed)
Treated skin with faint hyperpigmentation but no desquamation. Provided patient with tube of radiaplex and encouraged to continue use for two weeks.

## 2013-01-28 NOTE — Progress Notes (Signed)
°  Radiation Oncology         [phone]) [phone] ________________________________  Name: Tyler [name] MRN: [id number]  Date: 01/27/2013  DOB: 1932/07/26  End of Treatment Note  Diagnosis:   79 year old gentleman with dyspnea from stage IV adenocarcinoma lung  Indication for treatment:  Palliation of cough and dyspnea       Radiation treatment dates:   01/08/2013-01/27/2013  Site/dose:   The central chest including the gross primary target and lymph nodes were treated to 35 gray in 14 fractions of 2.5 gray  Beams/energy:   Anterior and posterior x-ray beams were used delivering 10 and 15 megavolt photons. The blocks were shaped to cover the target volumes while shielding critical lung and heart structures from the radiation distribution maximally.  Narrative: The patient tolerated radiation treatment relatively poorly. The patient struggled with cough throughout his radiation and developed mild radiation-induced erythema.  Plan: The patient has completed radiation treatment. The patient will return to radiation oncology clinic for routine followup in one month. I advised them to call or return sooner if they have any questions or concerns related to their recovery or treatment. ________________________________  Artist Pais. Kathrynn Running, M.D.

## 2013-01-29 ENCOUNTER — Other Ambulatory Visit (HOSPITAL_BASED_OUTPATIENT_CLINIC_OR_DEPARTMENT_OTHER): Payer: Medicare Other | Admitting: Lab

## 2013-01-29 DIAGNOSIS — C3490 Malignant neoplasm of unspecified part of unspecified bronchus or lung: Secondary | ICD-10-CM

## 2013-01-29 LAB — COMPREHENSIVE METABOLIC PANEL (CC13)
AST: 11 U/L (ref 5–34)
Alkaline Phosphatase: 44 U/L (ref 40–150)
BUN: 33.9 mg/dL — ABNORMAL HIGH (ref 7.0–26.0)
Creatinine: 1.4 mg/dL — ABNORMAL HIGH (ref 0.7–1.3)
Potassium: 4.5 mEq/L (ref 3.5–5.1)
Total Bilirubin: 1.13 mg/dL (ref 0.20–1.20)

## 2013-01-29 LAB — CBC WITH DIFFERENTIAL/PLATELET
Basophils Absolute: 0 10*3/uL (ref 0.0–0.1)
EOS%: 3.4 % (ref 0.0–7.0)
HGB: 14.3 g/dL (ref 13.0–17.1)
LYMPH%: 3.4 % — ABNORMAL LOW (ref 14.0–49.0)
MCH: 29.6 pg (ref 27.2–33.4)
MCV: 88.3 fL (ref 79.3–98.0)
MONO%: 10.6 % (ref 0.0–14.0)
NEUT%: 82.6 % — ABNORMAL HIGH (ref 39.0–75.0)
Platelets: 108 10*3/uL — ABNORMAL LOW (ref 140–400)
RDW: 13.5 % (ref 11.0–14.6)

## 2013-02-02 ENCOUNTER — Encounter: Payer: Self-pay | Admitting: Internal Medicine

## 2013-02-02 ENCOUNTER — Telehealth: Payer: Self-pay | Admitting: Pulmonary Disease

## 2013-02-02 ENCOUNTER — Ambulatory Visit (INDEPENDENT_AMBULATORY_CARE_PROVIDER_SITE_OTHER): Payer: Medicare Other | Admitting: Internal Medicine

## 2013-02-02 ENCOUNTER — Telehealth: Payer: Self-pay | Admitting: *Deleted

## 2013-02-02 ENCOUNTER — Other Ambulatory Visit: Payer: Self-pay | Admitting: *Deleted

## 2013-02-02 VITALS — BP 90/60 | HR 104 | Temp 96.8°F | Ht 68.0 in | Wt 177.0 lb

## 2013-02-02 DIAGNOSIS — J449 Chronic obstructive pulmonary disease, unspecified: Secondary | ICD-10-CM

## 2013-02-02 DIAGNOSIS — C3490 Malignant neoplasm of unspecified part of unspecified bronchus or lung: Secondary | ICD-10-CM

## 2013-02-02 MED ORDER — PREDNISONE (PAK) 10 MG PO TABS: ORAL_TABLET | ORAL | Status: DC

## 2013-02-02 MED ORDER — LEVOFLOXACIN 750 MG PO TABS: 750.0000 mg | ORAL_TABLET | Freq: Every day | ORAL | Status: DC

## 2013-02-02 NOTE — Telephone Encounter (Signed)
Pt called stating that last night he woke up an for a few seconds he felt like he was suffocating.  He is also having a lot of trouble eating/drinking due to pain in his esophagus, the palms of his hands are intermittently red and his daughter is recommending he start taking B6.  Pt is asking to have O2 delivered to the home today because he feels this will help prevent his suffocation.  Per Dr Donnald Garre, it is not certain that giving O2 would help but he could order it for him.  He is requesting it to be delivered today.  Informed pt that without any pulse O2 levels taken that insurance would not pay for O2.  They said they would be willing to pay out of pocket because he really wants it.  Dr Donnald Garre wanted to make sure he was taking carafate and pt may need pain medication to help relieve his esophageal pain.  Pt states he is starting to take carafate again today and is not wanting any pain medication right now because he is already struggling with constipation.  Informed him we can help manage his constipation if he feels that he needs pain medication.  Per Dr Donnald Garre, okay for pt to take B6 if he would like and feels that the red palms could be r/t the decadron.  SLJ

## 2013-02-02 NOTE — Telephone Encounter (Signed)
I spoke with the pt spouse and she states this morning at 5am the pt woke up and began coughing and stated that he felt like he was suffocating. She states the episode only lasted about 5 seconds and then he was fine, but was shaken up. He did not cough anything up and has been fine since then. She is wanting to know any recs for preventing this from happening. i reviewed recs from last OV and she stats they are doing all of these things. She asked about getting qualified for oxygen at night? Please advise. Carron Curie, CMA Allergies  Allergen Reactions  . Vicodin [Hydrocodone-Acetaminophen] Other (See Comments)    hallucinations

## 2013-02-02 NOTE — Patient Instructions (Addendum)
Rob dm 2 tsp every 4 hours as needed for cough supplement with Tramadol 50 mg (ultram) every 4 hours if needed  Use flutter valve as much as possible  Prednisone 10 mg take  4 each am x 2 days,   2 each am x 2 days,  1 each am x 2 days and stop  Levaquin 750 one dialy x 5 days  Stop spiriva and just use xopenex every 4 hours for breathing as needed  Try prilosec 20mg   Take 30-60 min before first meal of the day and Pepcid 20 mg one bedtime until cough is completely gone for at least a week without the need for cough suppression    GERD (REFLUX)  is an extremely common cause of respiratory symptoms, many times with no significant heartburn at all.    It can be treated with medication, but also with lifestyle changes including avoidance of late meals, excessive alcohol, smoking cessation, and avoid fatty foods, chocolate, peppermint, colas, red wine, and acidic juices such as orange juice.  NO MINT OR MENTHOL PRODUCTS SO NO COUGH DROPS  USE SUGARLESS CANDY INSTEAD (jolley ranchers or Stover's)  NO OIL BASED VITAMINS - use powdered substitutes.    Follow up with Dr Kendrick Fries in 2 weeks.

## 2013-02-02 NOTE — Telephone Encounter (Signed)
I called Tyler [name] and offered prednisone and antibiotics vs an office visit.  He prefers to be seen.  Will contact triage to try to get him in this afternoon.

## 2013-02-02 NOTE — Telephone Encounter (Signed)
appt set for today at 2:15 with MW pt states he can make it here by that time. Carron Curie, CMA

## 2013-02-02 NOTE — Progress Notes (Signed)
  Subjective:    Patient ID: Tyler [name], male    DOB: 1933-04-28, 79 y.o.   MRN: [id number]  Synopsis: Tyler [name] first saw the [place] pulmonary office in June of 2014. On that visit he was diagnosed with moderate COPD as well as stage IV non-small cell lung cancer. He started radiation therapy for his lung cancer in July 2014 and is due to start chemotherapy early August 2014.  HPI  01/19/2013 routine office visit>>  Tyler [name] has been struggling with cough since the last visit. He feels that radiation therapy has been making this worse. He has frequent secretions throughout the day and the constant sensation that he needs to clear his throat. This is worse when he lies down at night. It is worse after radiation therapy. He is coughing spells frequently which are quite bothersome. Shortness of breath is about the same since the last visit. In general, he feels much weaker and much more fatigue since the last visit. He sleeps frequently during the day. He continues to take the Spiriva daily. rec For your cough: Keep using the Mucinex as needed Delsym is an over the counter cough suppressant that works well that you can try When the cough is particularly bothersome, try using the tramadol 50mg  every six hours as needed Prop the head of your bed up Use warm beverages and hard candies to sooth your throat Keep taking the Spiriva    02/02/2013 f/u ov/[name] re cough Chief Complaint  Patient presents with  . Acute Visit    pt reports very diff time breathing last night x 5 secs, also c/o cough w yellow mucus  not on dex, very dry cough  RT completed 01/20/13  Next chemo 8/28 - last 01/22/13   Sob only with exertion and when coughing to point of choking, no better with albuterol  No obvious daytime variabilty or assoc  cp or chest tightness, subjective wheeze overt sinus or hb symptoms. No unusual exp hx or h/o childhood pna/ asthma or knowledge of premature birth.     Also denies any obvious  fluctuation of symptoms with weather or environmental changes or other aggravating or alleviating factors except as outlined above     Past Medical History  Diagnosis Date  . BENIGN PROSTATIC HYPERTROPHY 07/23/2008  . 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 HIP PAIN, LEFT 07/04/2007  . HYPERLIPIDEMIA 01/02/2007  . HYPERTENSION 01/02/2007  . INSOMNIA 11/16/2008  . PROSTATE SPECIFIC ANTIGEN, ELEVATED 09/10/2008  . Lung cancer     metastatic non small cell lung cancer, adenocarcinoma       .       Objective:   Physical Exam  amb wm nad very hoarse /sat 99% on RA  HEENT mild turbinate edema.  Oropharynx no thrush or excess pnd or cobblestoning.  No JVD or cervical adenopathy. Mild accessory muscle hypertrophy. Trachea midline, nl thryroid. Chest was hyperinflated by percussion with diminished breath sounds and moderate increased exp time without wheeze. Hoover sign positive at mid inspiration. Regular rate and rhythm without murmur gallop or rub or increase P2 or edema.  Abd: no hsm, nl excursion. Ext warm without cyanosis or clubbing.    cxr review 01/25/13  . No evidence of acute cardiopulmonary disease       Assessment & Plan:          .

## 2013-02-02 NOTE — Telephone Encounter (Addendum)
Tyler [name] called back stating they just saw Dr Sherene Sires, his O2 levels were fine.  He would like to cancel the O2 order.  Denny Levy with [facility] [phone] and left a message.  SLJ

## 2013-02-03 NOTE — Assessment & Plan Note (Addendum)
12/08/2012 simple spirometry ratio 55%, FEV1 0.94 L (30% predicted) - try off spiriva 02/03/2013 due to cough  Symptoms are markedly disproportionate to objective findings and not clear this is all a lung problem but pt does appear to have difficult airway management issues. DDX of  difficult airways managment all start with A and  include Adherence, Ace Inhibitors, Acid Reflux, Active Sinus Disease, Alpha 1 Antitripsin deficiency, Anxiety masquerading as Airways dz,  ABPA,  allergy(esp in young), Aspiration (esp in elderly), Adverse effects of DPI,  Active smokers, plus two Bs  = Bronchiectasis and Beta blocker use..and one C= CHF   Adherence is always the initial "prime suspect" and is a multilayered concern that requires a "trust but verify" approach in every patient - starting with knowing how to use medications, especially inhalers, correctly, keeping up with refills and understanding the fundamental difference between maintenance and prns vs those medications only taken for a very short course and then stopped and not refilled. The proper method of use, as well as anticipated side effects, of a metered-dose inhaler are discussed and demonstrated to the patient. Improved effectiveness after extensive coaching during this visit to a level of approximately  75% with xopenex hfa  ? Adverse effect of dpi > also complains of very dry mouth and throat > try off spiriva  ? Acid reflux > choking episode strongly suggestive of LPR/ VCD > try max rx     Each maintenance medication was reviewed in detail including most importantly the difference between maintenance and as needed and under what circumstances the prns are to be used.  Please see instructions for details which were reviewed in writing and the patient given a copy.

## 2013-02-04 ENCOUNTER — Other Ambulatory Visit: Payer: Medicare Other

## 2013-02-04 ENCOUNTER — Telehealth: Payer: Self-pay | Admitting: Radiation Oncology

## 2013-02-04 NOTE — Telephone Encounter (Signed)
Message copied by Agnes Lawrence on Wed Feb 04, 2013  4:22 PM ------      Message from: [place], [place]      Created: Wed Feb 04, 2013 12:44 PM      Regarding: RE: Skin      Contact: [phone]       I would recommend Biafine.      MM      ----- Message -----         From: Agnes Lawrence, RN         Sent: 02/04/2013  12:03 PM           To: Oneita Hurt, MD      Subject: Skin                                                     Dr. Kathrynn Running.            Tyler [name] completed radiation treatments on 01/27/2013. Since completion they presented to the clinic and I gave them another tube of radiaplex. They have completed this but, he continues to report "burning uncomfortable skin on his chest and back." I don't recall his skin reaction being severe. However, his wife, Tyler [name], phoned this morning "wondering if a prescription for silvadene would help." Would you like for me to give them some biafine, silvadene or make an appointment for a skin check?            [name]        ------

## 2013-02-04 NOTE — Telephone Encounter (Signed)
Phoned patient's home. Spoke with [name], his wife. Patient will pick up Biafine cream tomorrow following 1030 lab draw. Patient understanding nurse will assess skin at this time. [name] expressed understanding for why silvadene was not a good choice of skin care products at this time. All questions answered. She expressed understanding of all reviewed and appreciation for the help.

## 2013-02-05 ENCOUNTER — Other Ambulatory Visit (HOSPITAL_BASED_OUTPATIENT_CLINIC_OR_DEPARTMENT_OTHER): Payer: Medicare Other | Admitting: Lab

## 2013-02-05 ENCOUNTER — Ambulatory Visit
Admission: RE | Admit: 2013-02-05 | Discharge: 2013-02-05 | Disposition: A | Discharge status: Home / Self Care | Payer: Medicare Other | Source: Ambulatory Visit | Attending: Radiation Oncology | Admitting: Radiation Oncology

## 2013-02-05 ENCOUNTER — Telehealth: Payer: Self-pay | Admitting: Dietician

## 2013-02-05 DIAGNOSIS — C3492 Malignant neoplasm of unspecified part of left bronchus or lung: Secondary | ICD-10-CM

## 2013-02-05 DIAGNOSIS — C3490 Malignant neoplasm of unspecified part of unspecified bronchus or lung: Secondary | ICD-10-CM

## 2013-02-05 LAB — CBC WITH DIFFERENTIAL/PLATELET
Basophils Absolute: 0 10*3/uL (ref 0.0–0.1)
EOS%: 0.2 % (ref 0.0–7.0)
Eosinophils Absolute: 0 10*3/uL (ref 0.0–0.5)
HCT: 38.2 % — ABNORMAL LOW (ref 38.4–49.9)
HGB: 13.1 g/dL (ref 13.0–17.1)
MCH: 30 pg (ref 27.2–33.4)
MCV: 87.6 fL (ref 79.3–98.0)
MONO%: 17.8 % — ABNORMAL HIGH (ref 0.0–14.0)
NEUT#: 3.9 10*3/uL (ref 1.5–6.5)
NEUT%: 78.9 % — ABNORMAL HIGH (ref 39.0–75.0)
Platelets: 115 10*3/uL — ABNORMAL LOW (ref 140–400)

## 2013-02-05 LAB — COMPREHENSIVE METABOLIC PANEL (CC13)
AST: 12 U/L (ref 5–34)
Alkaline Phosphatase: 41 U/L (ref 40–150)
BUN: 26.8 mg/dL — ABNORMAL HIGH (ref 7.0–26.0)
Calcium: 8.8 mg/dL (ref 8.4–10.4)
Creatinine: 1.7 mg/dL — ABNORMAL HIGH (ref 0.7–1.3)
Glucose: 103 mg/dl (ref 70–140)

## 2013-02-05 NOTE — Telephone Encounter (Signed)
Brief Outpatient Oncology Nutrition Note  Patient has been identified to be at risk on malnutrition screen.  Wt Readings from Last 10 Encounters:  02/02/13 177 lb (80.287 kg)  01/25/13 196 lb (88.905 kg)  01/23/13 183 lb 9.6 oz (83.28 kg)  01/20/13 183 lb 1.6 oz (83.054 kg)  01/19/13 186 lb 3.2 oz (84.46 kg)  01/16/13 184 lb 14.4 oz (83.87 kg)  01/13/13 186 lb 3.2 oz (84.46 kg)  01/09/13 190 lb 3.2 oz (86.274 kg)  01/05/13 188 lb (85.276 kg)  12/31/12 188 lb 9.6 oz (85.548 kg)   DIAGNOSIS AND STAGE: Metastatic non-small cell lung cancer, adenocarcinoma presented with left upper lobe lung mass in addition to extensive supraclavicular, mediastinal adenopathy and bilateral pulmonary nodules diagnosed in July of 2014. EGFR mutation as well as ALK gene translocation are negative.    Called patient due to continued weight loss.  Patient currently not available.  [facility] information for [facility] provided.  Oran Rein, RD, LDN

## 2013-02-06 MED ORDER — BIAFINE EX EMUL
Freq: Every day | CUTANEOUS | Status: DC
  Administered 2013-02-06: 10:00:00 via TOPICAL

## 2013-02-06 NOTE — Progress Notes (Signed)
Late entry from 02/05/2013 at 1045. Patient presented to the clinic today accompanied by his wife, [name]. Hyperpigmented center of upper chest and anterior neck noted without desquamation. Also, center of upper back hyperpigmented without desquamation. Reports both areas are "itchy." Encouraged patient to refrain from scratchy skin and provided him with biafine cream as ordered by Dr. Kathrynn Running. Directed upon use of biafine. All questions answered. Patient verbalized understanding of all reviewed. Patient reports "all in all" he is doing a little better. Continues to report fatigue. Encouraged to contact staff with future needs.

## 2013-02-09 ENCOUNTER — Telehealth: Payer: Self-pay | Admitting: *Deleted

## 2013-02-09 NOTE — Telephone Encounter (Signed)
Per pt request, Attending physician statement form filled out from [employer] by Dr Donnald Garre, signed, and mailed back to pt's caregiver [name] per pt request.  SLJ

## 2013-02-10 ENCOUNTER — Ambulatory Visit: Payer: Medicare Other | Admitting: Nutrition

## 2013-02-10 NOTE — Progress Notes (Signed)
Patient is a 79 year old male patient diagnosed with non-small cell lung cancer.  He is a patient of Dr. Shirline Frees.  Past medical history includes hyperlipidemia, hypertension, BPH, left hip pain, and moderate COPD.  Medications include vitamin D, Decadron, Colace, Folvite, lorazepam, Centrum, Zofran, MiraLax, and prednisone.  Labs include sodium of 130, BUN 26.8, creatinine 1.7, and albumin 3.1.  Height: 68 inches. Weight: 177 pounds. Usual body weight: 188 pounds. BMI: 26.92.  Received a call from patient's wife requesting return phone call.  Patient is status post radiation therapy and is experiencing sore throat and dry mouth.  He is having difficulty swallowing.  He has increased fatigue.  Patient has also experienced 6% weight loss over the last 2 weeks.  Patient and wife seeking information on strategies for increasing oral intake.  Nutrition diagnosis: Food and nutrition related knowledge deficit related to diagnosis of lung cancer and associated treatments as evidenced by no prior need for nutrition related information.  Intervention: Patient was educated to consume soft, moist foods in small amounts throughout the day.  He was encouraged to add Ensure Plus 3 times a day between meals.  He was educated on strategies for improving dry mouth, including oral mouth rinses and soft, moist foods.  Patient was encouraged to add sauces and gravies to foods.  He was educated to avoid citrus foods or any other spicy foods that are irritating to his throat.  Questions were answered.  Teach back method used.  Monitoring, evaluation, goals: Patient will tolerate increased oral intake to minimize further weight loss.    Next visit: Thursday, August 28, during chemotherapy.

## 2013-02-11 ENCOUNTER — Other Ambulatory Visit: Payer: Medicare Other | Admitting: Lab

## 2013-02-11 ENCOUNTER — Encounter: Payer: Self-pay | Admitting: Internal Medicine

## 2013-02-11 ENCOUNTER — Other Ambulatory Visit: Payer: Self-pay | Admitting: *Deleted

## 2013-02-11 ENCOUNTER — Ambulatory Visit: Payer: Medicare Other

## 2013-02-11 ENCOUNTER — Ambulatory Visit: Payer: Medicare Other | Admitting: Physician Assistant

## 2013-02-12 ENCOUNTER — Other Ambulatory Visit (HOSPITAL_BASED_OUTPATIENT_CLINIC_OR_DEPARTMENT_OTHER): Payer: Medicare Other | Admitting: Lab

## 2013-02-12 ENCOUNTER — Telehealth: Payer: Self-pay | Admitting: *Deleted

## 2013-02-12 ENCOUNTER — Ambulatory Visit (HOSPITAL_BASED_OUTPATIENT_CLINIC_OR_DEPARTMENT_OTHER): Payer: Medicare Other | Admitting: Physician Assistant

## 2013-02-12 ENCOUNTER — Encounter: Payer: Self-pay | Admitting: Oncology

## 2013-02-12 ENCOUNTER — Ambulatory Visit (HOSPITAL_BASED_OUTPATIENT_CLINIC_OR_DEPARTMENT_OTHER): Payer: Medicare Other

## 2013-02-12 ENCOUNTER — Ambulatory Visit: Payer: Medicare Other | Admitting: Nutrition

## 2013-02-12 VITALS — BP 106/75 | HR 83 | Temp 97.7°F | Resp 20 | Ht 68.0 in | Wt 171.9 lb

## 2013-02-12 DIAGNOSIS — C3490 Malignant neoplasm of unspecified part of unspecified bronchus or lung: Secondary | ICD-10-CM

## 2013-02-12 DIAGNOSIS — R0602 Shortness of breath: Secondary | ICD-10-CM

## 2013-02-12 DIAGNOSIS — C3410 Malignant neoplasm of upper lobe, unspecified bronchus or lung: Secondary | ICD-10-CM

## 2013-02-12 DIAGNOSIS — Z5111 Encounter for antineoplastic chemotherapy: Secondary | ICD-10-CM

## 2013-02-12 DIAGNOSIS — R5381 Other malaise: Secondary | ICD-10-CM

## 2013-02-12 DIAGNOSIS — C3491 Malignant neoplasm of unspecified part of right bronchus or lung: Secondary | ICD-10-CM

## 2013-02-12 LAB — COMPREHENSIVE METABOLIC PANEL (CC13)
Albumin: 3.1 g/dL — ABNORMAL LOW (ref 3.5–5.0)
Alkaline Phosphatase: 46 U/L (ref 40–150)
BUN: 28.9 mg/dL — ABNORMAL HIGH (ref 7.0–26.0)
CO2: 20 mEq/L — ABNORMAL LOW (ref 22–29)
Calcium: 8.7 mg/dL (ref 8.4–10.4)
Chloride: 93 mEq/L — ABNORMAL LOW (ref 98–109)
Glucose: 202 mg/dl — ABNORMAL HIGH (ref 70–140)
Potassium: 5 mEq/L (ref 3.5–5.1)
Sodium: 124 mEq/L — ABNORMAL LOW (ref 136–145)
Total Protein: 5.9 g/dL — ABNORMAL LOW (ref 6.4–8.3)

## 2013-02-12 LAB — CBC WITH DIFFERENTIAL/PLATELET
BASO%: 0 % (ref 0.0–2.0)
Basophils Absolute: 0 10*3/uL (ref 0.0–0.1)
EOS%: 0 % (ref 0.0–7.0)
Eosinophils Absolute: 0 10*3/uL (ref 0.0–0.5)
HCT: 34.6 % — ABNORMAL LOW (ref 38.4–49.9)
HGB: 12.2 g/dL — ABNORMAL LOW (ref 13.0–17.1)
LYMPH%: 5.5 % — ABNORMAL LOW (ref 14.0–49.0)
MCH: 29.6 pg (ref 27.2–33.4)
MCHC: 35.3 g/dL (ref 32.0–36.0)
MCV: 84 fL (ref 79.3–98.0)
MONO#: 0.8 10*3/uL (ref 0.1–0.9)
MONO%: 13.8 % (ref 0.0–14.0)
NEUT#: 4.7 10*3/uL (ref 1.5–6.5)
NEUT%: 80.7 % — ABNORMAL HIGH (ref 39.0–75.0)
Platelets: 140 10*3/uL (ref 140–400)
RBC: 4.12 10*6/uL — ABNORMAL LOW (ref 4.20–5.82)
RDW: 13.9 % (ref 11.0–14.6)
WBC: 5.8 10*3/uL (ref 4.0–10.3)
lymph#: 0.3 10*3/uL — ABNORMAL LOW (ref 0.9–3.3)
nRBC: 0 % (ref 0–0)

## 2013-02-12 MED ORDER — SODIUM CHLORIDE 0.9 % IV SOLN
500.0000 mg/m2 | Freq: Once | INTRAVENOUS | Status: AC
  Administered 2013-02-12: 1000 mg via INTRAVENOUS
  Filled 2013-02-12: qty 40

## 2013-02-12 MED ORDER — SODIUM CHLORIDE 0.9 % IV SOLN
342.5000 mg | Freq: Once | INTRAVENOUS | Status: AC
  Administered 2013-02-12: 340 mg via INTRAVENOUS
  Filled 2013-02-12: qty 34

## 2013-02-12 MED ORDER — SODIUM CHLORIDE 0.9 % IV SOLN
Freq: Once | INTRAVENOUS | Status: AC
  Administered 2013-02-12: 14:00:00 via INTRAVENOUS

## 2013-02-12 MED ORDER — DEXAMETHASONE SODIUM PHOSPHATE 20 MG/5ML IJ SOLN
20.0000 mg | Freq: Once | INTRAMUSCULAR | Status: AC
  Administered 2013-02-12: 20 mg via INTRAVENOUS

## 2013-02-12 MED ORDER — ONDANSETRON 16 MG/50ML IVPB (CHCC)
16.0000 mg | Freq: Once | INTRAVENOUS | Status: AC
  Administered 2013-02-12: 16 mg via INTRAVENOUS

## 2013-02-12 NOTE — Telephone Encounter (Signed)
Per staff message and POF I have scheduled appts.  JMW  

## 2013-02-12 NOTE — Progress Notes (Signed)
Discharged ambulatory at 1640 with spouse and their son Casimiro Needle.  In no distress.

## 2013-02-12 NOTE — Progress Notes (Signed)
I met with patient and wife in chemotherapy.  Patient's weight continues to decline and was documented as 171.9 pounds August 28.  This is a decrease from 177 pounds on August 18.  Patient continues to experience sore throat and dry mouth.  He reports an improvement in his dry mouth after trying mouth rinses and Biotene products.  He continues to complain of increased fatigue.  Patient reports he drinks one Ensure Plus daily.  Nutrition diagnosis: Food and nutrition related knowledge deficit continues.  Intervention: I educated patient to increase meals and snacks throughout the day.  He is to increase Ensure Plus 3 times a day between meals.  He will continue dry mouth strategies including oral mouth rinses and soft, moist foods.  Questions were answered.  Teach back method used.  Monitoring, evaluation, goals: Patient has been unable to increase calories and protein and has had continued weight loss.  Next visit: Wednesday, September 17, during chemotherapy.

## 2013-02-12 NOTE — Patient Instructions (Addendum)
[facility] Discharge Instructions for Patients Receiving Chemotherapy  Today you received the following chemotherapy agents alimta, carboplatin  To help prevent nausea and vomiting after your treatment, we encourage you to take your nausea medication Zofran 8mg  disintegrating tablets as ordered.  If you develop nausea and vomiting that is not controlled by your nausea medication, call the clinic.   BELOW ARE SYMPTOMS THAT SHOULD BE REPORTED IMMEDIATELY:  *FEVER GREATER THAN 100.5 F  *CHILLS WITH OR WITHOUT FEVER  NAUSEA AND VOMITING THAT IS NOT CONTROLLED WITH YOUR NAUSEA MEDICATION  *UNUSUAL SHORTNESS OF BREATH  *UNUSUAL BRUISING OR BLEEDING  TENDERNESS IN MOUTH AND THROAT WITH OR WITHOUT PRESENCE OF ULCERS  *URINARY PROBLEMS  *BOWEL PROBLEMS  UNUSUAL RASH Items with * indicate a potential emergency and should be followed up as soon as possible.  Feel free to call the clinic should you have any questions or concerns. The clinic phone number is [phone].

## 2013-02-13 ENCOUNTER — Telehealth: Payer: Self-pay | Admitting: Medical Oncology

## 2013-02-13 MED ORDER — SUCRALFATE 1 GM/10ML PO SUSP: 1.0000 g | Freq: Four times a day (QID) | ORAL | Status: DC

## 2013-02-13 NOTE — Telephone Encounter (Signed)
Pharmacy did not receive rx for ' anesthetic". Note to Tyler [name].

## 2013-02-16 ENCOUNTER — Other Ambulatory Visit: Payer: Self-pay | Admitting: Hematology and Oncology

## 2013-02-16 MED ORDER — CLOTRIMAZOLE 10 MG MT LOZG: 10.0000 mg | LOZENGE | Freq: Every day | OROMUCOSAL | Status: DC

## 2013-02-16 NOTE — Progress Notes (Signed)
[facility] [phone]   [phone]  OFFICE PROGRESS NOTE  Rogelia Boga, MD [address] [place] [address]  DIAGNOSIS AND STAGE: Metastatic non-small cell lung cancer, adenocarcinoma presented with left upper lobe lung mass in addition to extensive supraclavicular, mediastinal adenopathy and bilateral pulmonary nodules diagnosed in July of 2014. EGFR mutation as well as ALK gene translocation are negative.   PRIOR THERAPY:  Palliative radiotherapy to the enlarged mediastinal and supraclavicular lymph nodes under the care of Dr. Kathrynn Running.  CURRENT THERAPY:  Systemic chemotherapy with carboplatin for AUC of 5 and Alimta 500 mg/M2 giving every 3 weeks. First dose given on 01/21/2013. Status post 1 cycle  CHEMOTHERAPY INTENT: Palliative  CURRENT # OF CHEMOTHERAPY CYCLES: 1  CURRENT ANTIEMETICS: Zofran, dexamethasone and Compazine  CURRENT SMOKING STATUS: currently a nonsmoker  ORAL CHEMOTHERAPY AND CONSENT: None  CURRENT BISPHOSPHONATES USE: None  PAIN MANAGEMENT: No pain  NARCOTICS INDUCED CONSTIPATION: N/A  LIVING WILL AND CODE STATUS: Full code   INTERVAL HISTORY: Tyler [name] 79 y.o. male returns to the clinic today for follow up visit accompanied by his girlfriend and his son. The patient is feeling fine today except for the generalized fatigue and shortness breath with exertion. He also continues to have hoarseness of his voice.he completed a course of Levaquin as prescribed by Dr. Sherene Sires and reports that his coughing fits has decreased and his secretions have improved as well. He continues to complain of discomfort with swallowing both of fluids and solids. He feels that this is slowly getting better. He is wanting something that is less cumbersome to use than the Carafate tablets but not as completely numbing as the viscous lidocaine. He continues to  lose weight to do to the difficulty swallowing and is decreased  by mouth intake. He denied having any significant fever or chills, denied having any nausea or vomiting. He does report concerns about being short of breath for getting short of breath when he moves about. Admittedly he states that he is not really moving around much during the day. He reports that he has a followup visit with pulmonologist, Dr. Kendrick Fries in the next week or so. The molecular study for EGFR mutation as well as ALK gene translocation were reported to be negative. The patient is here today to proceed with cycle #2 of his systemic chemotherapy with carboplatin and Alimta.    MEDICAL HISTORY: Past Medical History  Diagnosis Date  . BENIGN PROSTATIC HYPERTROPHY 07/23/2008  . 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 HIP PAIN, LEFT 07/04/2007  . HYPERLIPIDEMIA 01/02/2007  . HYPERTENSION 01/02/2007  . INSOMNIA 11/16/2008  . PROSTATE SPECIFIC ANTIGEN, ELEVATED 09/10/2008  . Lung cancer     metastatic non small cell lung cancer, adenocarcinoma     ALLERGIES:  is allergic to vicodin.  MEDICATIONS:  Current Outpatient Prescriptions  Medication Sig Dispense Refill  . cholecalciferol (VITAMIN D) 1000 UNITS tablet Take 1,000 Units by mouth every morning.       . clotrimazole (MYCELEX) 10 MG troche Take 1 lozenge (10 mg total) by mouth 5 (five) times daily.  35 lozenge  0  . dexamethasone (DECADRON) 4 MG tablet Take 4 mg by mouth 2 (two) times daily. Take the day before, the day of, and the day after chemo treatment every 3 weeks      . diltiazem (DILACOR XR) 240 MG 24 hr capsule Take 240 mg by mouth every morning.      Marland Kitchen  docusate sodium (COLACE) 250 MG capsule Take 1 capsule (250 mg total) by mouth daily.  10 capsule  0  . folic acid (FOLVITE) 1 MG tablet Take 1 mg by mouth every morning.      Marland Kitchen guaiFENesin-dextromethorphan (ROBITUSSIN DM) 100-10 MG/5ML syrup Take 5 mLs by mouth 4 (four) times daily as needed for cough.      Marland Kitchen levofloxacin (LEVAQUIN) 750 MG tablet Take 1 tablet (750 mg total)  by mouth daily. One daily stop if develop aching in joints/ muscles  5 tablet  0  . LORazepam (ATIVAN) 1 MG tablet Take 1 mg by mouth every 8 (eight) hours as needed for anxiety.      . multivitamin-iron-minerals-folic acid (CENTRUM) chewable tablet Chew 1 tablet by mouth every morning.       . ondansetron (ZOFRAN-ODT) 4 MG disintegrating tablet Take 4 mg by mouth every 8 (eight) hours as needed for nausea. place one tablet under the tongue every 8 hours prn nausea      . polyethylene glycol (MIRALAX / GLYCOLAX) packet Take 17 g by mouth daily as needed (constipation). Constipation.      . predniSONE (STERAPRED UNI-PAK) 10 MG tablet Prednisone 10 mg take  4 each am x 2 days,   2 each am x 2 days,  1 each am x2days and stop  14 tablet  0  . SPIRIVA HANDIHALER 18 MCG inhalation capsule       . sucralfate (CARAFATE) 1 GM/10ML suspension Take 10 mLs (1 g total) by mouth 4 (four) times daily.  420 mL  0  . Tamsulosin HCl (FLOMAX) 0.4 MG CAPS Take 1 capsule (0.4 mg total) by mouth daily after supper.  30 capsule  6  . Wound Cleansers (RADIAPLEX EX) Apply 1 application topically daily.        No current facility-administered medications for this visit.    SURGICAL HISTORY:  Past Surgical History  Procedure Laterality Date  . Transurethral resection of prostate  2004  . Total hip arthroplasty  2010, 2011    2010- left; 2011- right  . Upper gastrointestinal endoscopy      REVIEW OF SYSTEMS:  A comprehensive review of systems was negative except for: Constitutional: positive for anorexia, fatigue and weight loss Ears, nose, mouth, throat, and face: positive for hoarseness, sore throat and Discomfort with swallowing Respiratory: positive for cough and dyspnea on exertion   PHYSICAL EXAMINATION: General appearance: alert, cooperative, fatigued and no distress Head: Normocephalic, without obvious abnormality, atraumatic Neck: no adenopathy Lymph nodes: Cervical, supraclavicular, and axillary nodes  normal. Resp: wheezes bilaterally Back: symmetric, no curvature. ROM normal. No CVA tenderness. Cardio: regular rate and rhythm, S1, S2 normal, no murmur, click, rub or gallop GI: soft, non-tender; bowel sounds normal; no masses,  no organomegaly Extremities: extremities normal, atraumatic, no cyanosis or edema Neurologic: Alert and oriented X 3, normal strength and tone. Normal symmetric reflexes. Normal coordination and gait  ECOG PERFORMANCE STATUS: 1-2 - Symptomatic, <50% confined to bed  Blood pressure 106/75, pulse 83, temperature 97.7 F (36.5 C), temperature source Oral, resp. rate 20, height 5\' 8"  (1.727 m), weight 171 lb 14.4 oz (77.973 kg).  The patient's O2 sat on room air was 97% at rest, after ambulation O2 sat was 95%.  LABORATORY DATA: Lab Results  Component Value Date   WBC 5.8 02/12/2013   HGB 12.2* 02/12/2013   HCT 34.6* 02/12/2013   MCV 84.0 02/12/2013   PLT 140 02/12/2013  Chemistry      Component Value Date/Time   NA 124* 02/12/2013 1118   NA 129* 01/25/2013 0638   K 5.0 02/12/2013 1118   K 4.4 01/25/2013 0638   CL 94* 01/25/2013 0638   CO2 20* 02/12/2013 1118   CO2 25 12/10/2012 1308   BUN 28.9* 02/12/2013 1118   BUN 35* 01/25/2013 0638   CREATININE 1.2 02/12/2013 1118   CREATININE 1.60* 01/25/2013 0638      Component Value Date/Time   CALCIUM 8.7 02/12/2013 1118   CALCIUM 9.5 12/10/2012 1308   ALKPHOS 46 02/12/2013 1118   ALKPHOS 39 04/24/2012 0918   AST 19 02/12/2013 1118   AST 16 04/24/2012 0918   ALT 31 02/12/2013 1118   ALT 19 04/24/2012 0918   BILITOT 0.42 02/12/2013 1118   BILITOT 0.8 04/24/2012 0918       RADIOGRAPHIC STUDIES: Ct Head W Wo Contrast  12/25/2012   *RADIOLOGY REPORT*  Clinical Data: Lung mass.  Staging.  CT HEAD WITHOUT AND WITH CONTRAST  Technique:  Contiguous axial images were obtained from the base of the skull through the vertex without and with intravenous contrast.  Contrast: 80mL OMNIPAQUE IOHEXOL 300 MG/ML  SOLN  Comparison: PET  scan 12/18/2012  Findings: There is no evidence for acute infarction, intracranial hemorrhage, mass lesion, hydrocephalus, or extra-axial fluid.  Mild atrophy is present.  There is mild chronic microvascular ischemic change.  Dystrophic calcification noted  right anterior frontal gyrus and left inferior temporal gyrus, likely old ischemic/traumatic/inflammatory insult.  No signs of surrounding edema to suggest acute process.  The calvarium is intact.  There are no definite osseous lytic or sclerotic lesions.  Scattered lucencies in the calvarium are suspected to represent venous lakes (see   image 20 series 3).  Advanced vascular calcification.  Negative orbits, paranasal sinuses, and mastoids.  Post infusion, there is no abnormal enhancement of the brain or meninges.  No CT signs of proximal vascular thrombosis.  IMPRESSION: Atrophy and chronic microvascular ischemic change.  No definite intracranial metastatic disease.  Please note that MRI with 3T scanning is more sensitive,  if there are no contraindications.  Indeterminate, but non-acute remote insults resulting in dystrophic calcification as described above.  Chronic atherosclerotic change of the intracranial vasculature.  No definite osseous lytic lesions.   Original Report Authenticated By: Davonna Belling, M.D.   Nm Pet Image Initial (pi) Skull Base To Thigh  12/18/2012   *RADIOLOGY REPORT*  Clinical Data: Initial treatment strategy for lung mass.  NUCLEAR MEDICINE PET SKULL BASE TO THIGH  Fasting Blood Glucose:  101  Technique:  19.6 mCi F-18 FDG was injected intravenously. CT data was obtained and used for attenuation correction and anatomic localization only.  (This was not acquired as a diagnostic CT examination.) Additional exam technical data entered on technologist worksheet.  Comparison:  CT chest 12/11/2012.  Findings:  Neck: No hypermetabolic lymph nodes in the neck.  CT images show no acute findings.  Chest:  Hypermetabolic bilateral  supraclavicular adenopathy with an S U V max of 14.0 on the right.  Hypermetabolic adenopathy extends into the mediastinum bilaterally.  There are two somewhat ill- defined nodal lesions in the AP window which collectively, have an S U V max 21.2.  Hypermetabolic adenopathy extends inferiorly into the subcarinal station.  Spiculated left upper lobe mass measures 2.5 x 3.5 cm and has an S U V max of 17.0.  A 10 mm nodule is seen in the posterior aspect of the  left upper lobe, with an S U V max of 3.2.  There are additional scattered pulmonary nodules bilaterally, some of which do show mild hypermetabolism.  No additional areas of abnormal hypermetabolism in the chest. Moderate pericardial effusion persists.  No pleural fluid.  Abdomen/Pelvis:  The left adrenal gland may be minimally nodular in appearance and is hypermetabolic, with an S U V max of 9.7.  No additional areas of abnormal hypermetabolism in the abdomen or pelvis.  CT images show no acute findings.  Left renal stone or renal vascular calcification.  Infrarenal aortic aneurysm measures 7.1 x 7.5 cm and ends above the bifurcation.  There are bilateral inguinal hernias, containing fat on the right and unobstructed colon on the left.  Skeleton:  No focal hypermetabolic activity to suggest skeletal metastasis.  IMPRESSION:  1.  Left upper lobe mass with extensive hypermetabolic supraclavicular/mediastinal adenopathy, bilateral pulmonary nodules (some of which are hypermetabolic) and a hypermetabolic nodular appearing left adrenal gland.  Collectively, findings are most consistent with stage IV primary bronchogenic carcinoma. 2.  Large infrarenal aortic aneurysm. 3.  Moderate pericardial effusion, stable.   Original Report Authenticated By: Leanna Battles, M.D.   US Biopsy  12/25/2012   *RADIOLOGY REPORT*  Clinical history:79 year old with a left lung mass and evidence for metastatic disease.  The patient has supraclavicular lymphadenopathy.  The patient  needs a tissue diagnosis.  PROCEDURE(S): ULTRASOUND GUIDED RIGHT SUPRACLAVICULAR LYMPH NODE BIOPSY  Physician: Rachelle Hora. Henn, MD  Medications:Versed 1.5 mg, Fentanyl 50 mcg. A radiology nurse monitored the patient for moderate sedation.  Moderate sedation time:20 minutes  Fluoroscopy time: None  Procedure:The procedure was explained to the patient.  The risks and benefits of the procedure were discussed and the patient's questions were addressed.  Informed consent was obtained from the patient.  The neck was evaluated with ultrasound.  Lymph nodes in the right supraclavicular region were selected for biopsy.  The right side of the neck was prepped and draped in a sterile fashion. Skin was anesthetized with lidocaine.  18 gauge core biopsy needle was directed into a conglomeration of lymph nodes in the right supraclavicular region with ultrasound guidance. The nodes are lateral to the right internal jugular vein.  A total of four core biopsies were obtained.  Specimens were placed in saline.  Findings:Multiple enlarged supraclavicular lymph nodes bilaterally. Small conglomeration of lymph nodes in the right supraclavicular region were biopsied.  No significant bleeding following the core biopsies.  Complications: None  Impression:Successful ultrasound guided core biopsies of lymph nodes in the right supraclavicular region.   Original Report Authenticated By: Richarda Overlie, M.D.    ASSESSMENT AND PLAN: this is a very pleasant 79 years old white male recently diagnosed with metastatic non-small cell lung cancer, adenocarcinoma with extensive bilateral supraclavicular, mediastinal and bilateral pulmonary nodules.  He is status post palliative radiotherapy to the extensive mediastinal lymphadenopathy under the care of Dr. Kathrynn Running. The molecular study for EGFR mutation as well as ALK gene translocation were negative. The patient and his family members had several questions all of which were answered to their  satisfaction. I suspect some of his shortness of breath with exertion is due to deconditioning. Patient was advised to gradually increase his activity as tolerated. A prescription for Carafate suspension was called to the patient's pharmacy of record to see if this formulation was more helpful with his complaints of discomfort with swallowing. He is also being followed by our dietitian regarding his poor by mouth intake. He'll continue  with weekly labs as scheduled followup in 3 weeks prior to his next scheduled cycle of chemotherapy. He is encouraged to keep his followup appointments with Dr. Kathrynn Running and with the pulmonologist.  Conni Slipper, PA-C   The patient voices understanding of current disease status and treatment options and is in agreement with the current care plan.  All questions were answered. The patient knows to call the clinic with any problems, questions or concerns. We can certainly see the patient much sooner if necessary.  I spent 35 minutes counseling the patient face to face. The total time spent in the appointment was 45 minutes.

## 2013-02-17 ENCOUNTER — Telehealth: Payer: Self-pay | Admitting: Internal Medicine

## 2013-02-17 ENCOUNTER — Telehealth: Payer: Self-pay | Admitting: Medical Oncology

## 2013-02-17 DIAGNOSIS — B370 Candidal stomatitis: Secondary | ICD-10-CM

## 2013-02-17 MED ORDER — FLUCONAZOLE 200 MG PO TABS: 200.0000 mg | ORAL_TABLET | Freq: Every day | ORAL | Status: DC

## 2013-02-17 NOTE — Patient Instructions (Addendum)
Continue weekly labs as scheduled Followup in 3 weeks prior to her next scheduled cycle of chemotherapy Keep your scheduled appointments with Dr. Kathrynn Running and the pulmonologist.

## 2013-02-17 NOTE — Telephone Encounter (Signed)
I called in diflucan 200 mg x 10 days and told wife someone will look at pts mouth on Thursday when he comes in for labs.

## 2013-02-17 NOTE — Telephone Encounter (Signed)
lmonvm for pt home/cell re appt for 9/18 and also that wkly lbs have been moved from Wednesdays to thursdays and the next appt is 9/4. Pt given appt d/t's for 9/4, 9/11, 9/18 and asked to get new schedule 9/4.

## 2013-02-17 NOTE — Telephone Encounter (Signed)
I returned wife's call about appt today and left message to call me back.

## 2013-02-18 ENCOUNTER — Other Ambulatory Visit: Payer: Medicare Other | Admitting: Lab

## 2013-02-19 ENCOUNTER — Other Ambulatory Visit (HOSPITAL_BASED_OUTPATIENT_CLINIC_OR_DEPARTMENT_OTHER): Payer: Medicare Other | Admitting: Lab

## 2013-02-19 ENCOUNTER — Other Ambulatory Visit: Payer: Self-pay | Admitting: Physician Assistant

## 2013-02-19 ENCOUNTER — Encounter: Payer: Self-pay | Admitting: Medical Oncology

## 2013-02-19 DIAGNOSIS — C3490 Malignant neoplasm of unspecified part of unspecified bronchus or lung: Secondary | ICD-10-CM

## 2013-02-19 DIAGNOSIS — C3410 Malignant neoplasm of upper lobe, unspecified bronchus or lung: Secondary | ICD-10-CM

## 2013-02-19 LAB — COMPREHENSIVE METABOLIC PANEL (CC13)
ALT: 26 U/L (ref 0–55)
Albumin: 2.7 g/dL — ABNORMAL LOW (ref 3.5–5.0)
CO2: 27 mEq/L (ref 22–29)
Glucose: 136 mg/dl (ref 70–140)
Potassium: 4.6 mEq/L (ref 3.5–5.1)
Sodium: 128 mEq/L — ABNORMAL LOW (ref 136–145)
Total Bilirubin: 0.75 mg/dL (ref 0.20–1.20)
Total Protein: 5.4 g/dL — ABNORMAL LOW (ref 6.4–8.3)

## 2013-02-19 LAB — CBC WITH DIFFERENTIAL/PLATELET
Basophils Absolute: 0 10*3/uL (ref 0.0–0.1)
Eosinophils Absolute: 0 10*3/uL (ref 0.0–0.5)
HGB: 13 g/dL (ref 13.0–17.1)
MCV: 84.9 fL (ref 79.3–98.0)
MONO#: 0.2 10*3/uL (ref 0.1–0.9)
MONO%: 11.1 % (ref 0.0–14.0)
NEUT#: 1.4 10*3/uL — ABNORMAL LOW (ref 1.5–6.5)
RBC: 4.38 10*6/uL (ref 4.20–5.82)
RDW: 14.4 % (ref 11.0–14.6)
WBC: 1.7 10*3/uL — ABNORMAL LOW (ref 4.0–10.3)
lymph#: 0.1 10*3/uL — ABNORMAL LOW (ref 0.9–3.3)
nRBC: 0 % (ref 0–0)

## 2013-02-19 NOTE — Progress Notes (Signed)
Pt and wife seen and I reviewed his cbc and checked his mouth . He is currently on Diflucan day 3 and wife reports the yeast is gone. Pt removed his dentures and his  oral mucous membranes are moist and I did not see any  yeast or mucosal breakdown. Tyler [name] is using biotine and removes his dentures HS. I reviewed neutropenic precautions and when to call physician. They voice understanding.

## 2013-02-20 ENCOUNTER — Telehealth: Payer: Self-pay | Admitting: Medical Oncology

## 2013-02-20 DIAGNOSIS — R1310 Dysphagia, unspecified: Secondary | ICD-10-CM

## 2013-02-20 NOTE — Telephone Encounter (Signed)
Pt has cataracts and needs to see opthalmology . Asking if Dr Arbutus Ped has any recommmendation Note to dr Arbutus Ped.

## 2013-02-20 NOTE — Telephone Encounter (Signed)
Tyler [name] is stilling reporting a " sore throat " in back of throat. He is not taking his carafate so after talking to [name] , RN in radiation I  recommended that  he start back on the carafate and to call if symptoms worsen or he has fever.

## 2013-02-23 ENCOUNTER — Telehealth: Payer: Self-pay | Admitting: Pulmonary Disease

## 2013-02-23 NOTE — Telephone Encounter (Signed)
I called and spoke with pt. He stated he is wanting an appt with a physician since BQ is not in [place] until 03/09/13. I scheduled him to come in and see KC in the AM at 11:15. He needed nothing further

## 2013-02-24 ENCOUNTER — Ambulatory Visit (INDEPENDENT_AMBULATORY_CARE_PROVIDER_SITE_OTHER): Payer: Medicare Other | Admitting: Pulmonary Disease

## 2013-02-24 ENCOUNTER — Telehealth: Payer: Self-pay | Admitting: Medical Oncology

## 2013-02-24 ENCOUNTER — Encounter: Payer: Self-pay | Admitting: Pulmonary Disease

## 2013-02-24 VITALS — BP 82/60 | HR 92 | Temp 97.1°F | Ht 68.0 in | Wt 169.0 lb

## 2013-02-24 DIAGNOSIS — R05 Cough: Secondary | ICD-10-CM

## 2013-02-24 DIAGNOSIS — J449 Chronic obstructive pulmonary disease, unspecified: Secondary | ICD-10-CM

## 2013-02-24 MED ORDER — OMEPRAZOLE 40 MG PO CPDR: 40.0000 mg | DELAYED_RELEASE_CAPSULE | Freq: Every day | ORAL | Status: DC

## 2013-02-24 MED ORDER — AMOXICILLIN-POT CLAVULANATE 875-125 MG PO TABS: 1.0000 | ORAL_TABLET | Freq: Two times a day (BID) | ORAL | Status: DC

## 2013-02-24 MED ORDER — GUAIFENESIN-CODEINE 100-10 MG/5ML PO SYRP: ORAL_SOLUTION | ORAL | Status: DC

## 2013-02-24 MED ORDER — OMEPRAZOLE 40 MG PO CPDR: 40.0000 mg | DELAYED_RELEASE_CAPSULE | Freq: Two times a day (BID) | ORAL | Status: DC

## 2013-02-24 NOTE — Telephone Encounter (Signed)
Wife notified. I also told wife to call Corinda Gubler 's office and ask for appt with NP or PA in that office and request a new provider.

## 2013-02-24 NOTE — Telephone Encounter (Signed)
Message copied by Charma Igo on Tue Feb 24, 2013  1:45 PM ------      Message from: Si Gaul      Created: Fri Feb 20, 2013  5:13 PM       No surgery now. May be after completion of chemo.      ----- Message -----         From: Charma Igo, RN         Sent: 02/20/2013   1:30 PM           To: Si Gaul, MD            [place]  has cataracts and needs to see opthalmology . Asking if Dr Arbutus Ped has any recommmendation.             ------

## 2013-02-24 NOTE — Assessment & Plan Note (Addendum)
The patient is not having an acute exacerbation at this time.  He was asked by Dr. Sherene Sires at the last visit to take his beta agonist consistently.

## 2013-02-24 NOTE — Progress Notes (Signed)
  Subjective:    Patient ID: Tyler [name], male    DOB: 1932-10-27, 79 y.o.   MRN: [id number]  HPI Patient comes in today for persistent cough.  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  He was seen by Dr. Sherene Sires a few weeks ago, and placed on Levaquin as well as a prednisone taper.  He was asked to stop Spiriva because of possible irritation to the upper airway, and was also placed on a proton pump inhibitor for potential LPR.  The patient feels like his cough has not improved, but he only took the proton pump inhibitor for one to 2 days.  He is now bringing up a little more mucus according to the wife, and it is purulent in nature.  He does not think it is draining out of his head posteriorly.  The cough is coming in paroxysms, and often leads to regurgitation and sometimes frank vomiting.  He is clearly having "heartburn".  His blood pressure has been on the low in the last 4-6 weeks, and he is asking about his Cardizem.   Review of Systems  Constitutional: Positive for activity change and fatigue. Negative for fever and unexpected weight change.  HENT: Positive for sore throat. Negative for ear pain, nosebleeds, congestion, rhinorrhea, sneezing, trouble swallowing, dental problem, postnasal drip and sinus pressure.   Eyes: Negative for redness and itching.  Respiratory: Positive for cough. Negative for chest tightness, shortness of breath and wheezing.   Cardiovascular: Positive for chest pain ( upper chest/throat). Negative for palpitations and leg swelling.  Gastrointestinal: Negative for nausea and vomiting.       Acid reflux  Genitourinary: Negative for dysuria.  Musculoskeletal: Negative for joint swelling.  Skin: Negative for rash.  Neurological: Negative for headaches.  Hematological: Does  not bruise/bleed easily.  Psychiatric/Behavioral: Negative for dysphoric mood. The patient is not nervous/anxious.        Objective:   Physical Exam Frail-appearing male in no acute distress Nose without purulence or discharge noted Oropharynx clear Neck without lymphadenopathy or thyromegaly Chest with a few scattered crackles, decreased breath sounds, no active wheezing Cardiac exam with regular rate and rhythm Lower extremities with minimal edema, no cyanosis Alert and oriented, moves all 4 extremities.       Assessment & Plan:

## 2013-02-24 NOTE — Assessment & Plan Note (Signed)
The patient is having a debilitating cough with paroxysms that clearly is multifactorial.  He is having swallowing issues from his radiation-induced esophagitis, and I wonder if he is having aspiration.  He clearly is having severe reflux with regurgitation, and this may be propagating his cough.  I would like to treat him aggressively with b.i.d. Proton pump inhibitor, and I have asked him to stay on this compliantly.  I would also consider whether he may be developing radiation pneumonitis, and I would like to check a chest x-ray today.  However, the wife would like to avoid this, and wait until he sees his radiation oncologist.  The patient is quite weak, and worn out from his cough.  With his advanced stage cancer I think we ought to work on aggressive cough suppression.  He is concerned about taking any cardiac, especially with his history of mental status changes with Vicodin.  Will try him on Robitussin with codeine in order to provide him a little comfort.

## 2013-02-24 NOTE — Patient Instructions (Addendum)
Will put you on omeprazole 40mg  one in am and pm for your acid reflux. Will give your robitussin with codeine, and take 1-2 teaspoons up to every 6 hrs if needed for cough.  This is not vicodin, but is a narcotic.  See how it affects you after the first dose.  Will treat with augmentin 875mg  one in am and pm for next 7 days for possible bronchitis. Stop diltiazem for now, but contact your oncologist today to discuss your blood pressure, your BP medication.  You may need IV fluids.   followup with Dr. Kendrick Fries if your cough is not getting some relief.

## 2013-02-25 ENCOUNTER — Telehealth: Payer: Self-pay | Admitting: Pulmonary Disease

## 2013-02-25 ENCOUNTER — Other Ambulatory Visit: Payer: Medicare Other

## 2013-02-25 NOTE — Telephone Encounter (Signed)
Diflucan and augmentin are treating completely different infections. I am recommending the pt take 40mg  twice a day of omeprazole.  The pt's wife can choose to stick with this recommendation or do whatever she wishes.

## 2013-02-25 NOTE — Telephone Encounter (Signed)
Called spoke with [name] who stated that when she went to pharmacy to pick up pt's rx's from yesterday's visit, pt's omeprazole 40mg  was sent as QD rather than BID as discussed.  Pt's chart verifies that the rx was sent correctly, but [name] stated that she viewed the rx at the pharmacy and it indeed stated QD.  Advised [name] will call [facility] and give rx verbally.  [name] would also like to ask KC's thoughts about pt's abx sent at [facility] (augmentin) >> pt is finishing up rx for diflucan prescribed by Dr Arbutus Ped on 9.2.14.  Does pt need to wait any specific amount of time between the two rx's?  Please advise Dr Shelle Iron, thanks.  **Called [facility], spoke with pharmacist [place].  [place] stated that when he spoke with [name], she had requested omeprazole 20mg  capsules for pt to take 2 capsules twice daily.  [place] reported this has been taken care of.  Also advised that pt's omeprazole rx is for BID, rather than QD.  Nothing further needed on that matter.

## 2013-02-25 NOTE — Telephone Encounter (Signed)
Spoke with [name] and notified of recs per [facility] She verbalize understanding and states nothing further needed

## 2013-02-26 ENCOUNTER — Other Ambulatory Visit (HOSPITAL_BASED_OUTPATIENT_CLINIC_OR_DEPARTMENT_OTHER): Payer: Medicare Other

## 2013-02-26 ENCOUNTER — Encounter: Payer: Self-pay | Admitting: Radiation Oncology

## 2013-02-26 ENCOUNTER — Ambulatory Visit
Admission: RE | Admit: 2013-02-26 | Discharge: 2013-02-26 | Disposition: A | Discharge status: Home / Self Care | Payer: Medicare Other | Source: Ambulatory Visit | Attending: Radiation Oncology | Admitting: Radiation Oncology

## 2013-02-26 ENCOUNTER — Telehealth: Payer: Self-pay | Admitting: Medical Oncology

## 2013-02-26 VITALS — BP 81/51 | HR 99 | Resp 16 | Wt 167.7 lb

## 2013-02-26 DIAGNOSIS — I10 Essential (primary) hypertension: Secondary | ICD-10-CM

## 2013-02-26 DIAGNOSIS — C3490 Malignant neoplasm of unspecified part of unspecified bronchus or lung: Secondary | ICD-10-CM

## 2013-02-26 DIAGNOSIS — C3492 Malignant neoplasm of unspecified part of left bronchus or lung: Secondary | ICD-10-CM

## 2013-02-26 DIAGNOSIS — C3410 Malignant neoplasm of upper lobe, unspecified bronchus or lung: Secondary | ICD-10-CM

## 2013-02-26 LAB — CBC WITH DIFFERENTIAL/PLATELET
BASO%: 0.1 % (ref 0.0–2.0)
LYMPH%: 13 % — ABNORMAL LOW (ref 14.0–49.0)
MCHC: 34.4 g/dL (ref 32.0–36.0)
MCV: 86.7 fL (ref 79.3–98.0)
MONO#: 0.3 10*3/uL (ref 0.1–0.9)
MONO%: 12.4 % (ref 0.0–14.0)
Platelets: 127 10*3/uL — ABNORMAL LOW (ref 140–400)
RBC: 3.86 10*6/uL — ABNORMAL LOW (ref 4.20–5.82)
RDW: 14.1 % (ref 11.0–14.6)
WBC: 2.8 10*3/uL — ABNORMAL LOW (ref 4.0–10.3)

## 2013-02-26 LAB — COMPREHENSIVE METABOLIC PANEL (CC13)
ALT: 36 U/L (ref 0–55)
Alkaline Phosphatase: 41 U/L (ref 40–150)
Potassium: 4.5 mEq/L (ref 3.5–5.1)
Sodium: 126 mEq/L — ABNORMAL LOW (ref 136–145)
Total Bilirubin: 0.49 mg/dL (ref 0.20–1.20)
Total Protein: 5.8 g/dL — ABNORMAL LOW (ref 6.4–8.3)

## 2013-02-26 MED ORDER — DILTIAZEM HCL ER 120 MG PO CP24: 120.0000 mg | ORAL_CAPSULE | Freq: Every morning | ORAL | Status: DC

## 2013-02-26 NOTE — Telephone Encounter (Signed)
I returned Tyler [name]'s call . She asked if Dr Arbutus Ped would call her about her dad's situation. I left Tyler [name] a message to tell her that Dr Arbutus Ped said to tell her that there are no new scans and that the focus now is to get Tyler [name] through his chemotherapy. I also told Vickey Huger to please call [place] or [place].

## 2013-02-26 NOTE — Progress Notes (Signed)
Radiation Oncology         [phone] ________________________________  Name: Tyler [name] MRN: [id number]  Date: 02/26/2013  DOB: [birth year]  Follow-Up Visit Note  CC: Rogelia Boga, MD  Si Gaul, MD  Diagnosis:   79 year old gentleman with dyspnea from stage IV adenocarcinoma lung s/p XRT for palliation of cough and dyspnea 01/08/2013-01/27/2013 to 35 gray in 14 fractions  Interval Since Last Radiation:  4  weeks  Narrative:  The patient returns today for routine follow-up.  Reports a poor appetite due to taste changes. Reports swallowing has improved. Reports taking benadryl to aid in sleep. Concerned about feeling lightheaded. Patient provided to be orthostatic. Fatigue and weakness are overwhelming. Reports cough with white sputum. Reports cough medication with narcotic is helping. Patient weighed 183 on 8/8 but, today weighs 167 lb                               ALLERGIES:  is allergic to vicodin.  Meds: Current Outpatient Prescriptions  Medication Sig Dispense Refill  . amoxicillin-clavulanate (AUGMENTIN) 875-125 MG per tablet Take 1 tablet by mouth 2 (two) times daily.  14 tablet  0  . cholecalciferol (VITAMIN D) 1000 UNITS tablet Take 1,000 Units by mouth every morning.       . clotrimazole (MYCELEX) 10 MG troche       . dexamethasone (DECADRON) 4 MG tablet Take 4 mg by mouth 2 (two) times daily. Take the day before, the day of, and the day after chemo treatment every 3 weeks      . diltiazem (DILACOR XR) 240 MG 24 hr capsule Take 240 mg by mouth every morning.      . docusate sodium (COLACE) 250 MG capsule Take 1 capsule (250 mg total) by mouth daily.  10 capsule  0  . fluconazole (DIFLUCAN) 200 MG tablet Take 1 tablet (200 mg total) by mouth daily.  10 tablet  0  . folic acid (FOLVITE) 1 MG tablet Take 1 mg by mouth every morning.      Marland Kitchen guaiFENesin-codeine (ROBITUSSIN AC) 100-10 MG/5ML syrup Take 1-2 tsp up to every 6 hours if needed for cough.  120 mL  0    . guaiFENesin-dextromethorphan (ROBITUSSIN DM) 100-10 MG/5ML syrup Take 5 mLs by mouth 4 (four) times daily as needed for cough.      Marland Kitchen levofloxacin (LEVAQUIN) 750 MG tablet       . lidocaine (XYLOCAINE) 2 % solution       . LORazepam (ATIVAN) 1 MG tablet Take 1 mg by mouth every 8 (eight) hours as needed for anxiety.      . multivitamin-iron-minerals-folic acid (CENTRUM) chewable tablet Chew 1 tablet by mouth every morning.       Marland Kitchen omeprazole (PRILOSEC) 40 MG capsule Take 1 capsule (40 mg total) by mouth 2 (two) times daily.  60 capsule  0  . ondansetron (ZOFRAN-ODT) 4 MG disintegrating tablet Take 4 mg by mouth every 8 (eight) hours as needed for nausea. place one tablet under the tongue every 8 hours prn nausea      . polyethylene glycol (MIRALAX / GLYCOLAX) packet Take 17 g by mouth daily as needed (constipation). Constipation.      . predniSONE (DELTASONE) 10 MG tablet       . tamsulosin (FLOMAX) 0.4 MG CAPS capsule        No current facility-administered medications for this encounter.    Physical  Findings: The patient is in no acute distress. Patient is alert and oriented.  weight is 167 lb 11.2 oz (76.068 kg). His blood pressure is 81/51 and his pulse is 99. His respiration is 16. .Lungs CTA, Heart tachy but regular.  Poor turgor.  Impression:  The patient is recovering from the effects of radiation, very slowly, secondary to poor nutrition and hydration.  But, he is improving.  Plan:  I halved his Diltiazem and talked to him about checking BP before he takes it since he has lost 16 lbs and appears to have some hypovolemia.  I'll see him back in December.  _____________________________________  Artist Pais Kathrynn Running, M.D.

## 2013-02-26 NOTE — Progress Notes (Signed)
Reports a poor appetite due to taste changes. Reports swallowing has improved. Reports taking benadryl to aid in sleep. Concerned about feeling lightheaded. Patient provided to be orthostatic. Fatigue and weakness are overwhelming. Reports cough with white sputum. Reports cough medication with narcotic is helping. Patient weighed 183 on 8/8 but, today weighs 167 lb

## 2013-03-02 ENCOUNTER — Telehealth: Payer: Self-pay | Admitting: Medical Oncology

## 2013-03-02 NOTE — Telephone Encounter (Signed)
Wife called for pt . Pt thinks he has thrush on his upper palate. His upper palate he described as very sticky when he took his dentures out. He denies pain . His wife says his tongue is clear. She cannot see very good on the upper palate. I asked her to look at the upper palate with a flashlight and call me back. He was sleeping when I returned her call ans she will leave me a message.

## 2013-03-03 ENCOUNTER — Telehealth: Payer: Self-pay | Admitting: *Deleted

## 2013-03-03 ENCOUNTER — Other Ambulatory Visit: Payer: Self-pay

## 2013-03-03 ENCOUNTER — Emergency Department (HOSPITAL_COMMUNITY): Payer: Medicare Other

## 2013-03-03 ENCOUNTER — Emergency Department (HOSPITAL_COMMUNITY)
Admission: EM | Admit: 2013-03-03 | Discharge: 2013-03-03 | Disposition: A | Discharge status: Home / Self Care | Payer: Medicare Other | Attending: Emergency Medicine | Admitting: Emergency Medicine

## 2013-03-03 ENCOUNTER — Encounter (HOSPITAL_COMMUNITY): Payer: Self-pay | Admitting: *Deleted

## 2013-03-03 DIAGNOSIS — I10 Essential (primary) hypertension: Secondary | ICD-10-CM | POA: Insufficient documentation

## 2013-03-03 DIAGNOSIS — Z87448 Personal history of other diseases of urinary system: Secondary | ICD-10-CM | POA: Insufficient documentation

## 2013-03-03 DIAGNOSIS — Z79899 Other long term (current) drug therapy: Secondary | ICD-10-CM | POA: Insufficient documentation

## 2013-03-03 DIAGNOSIS — R531 Weakness: Secondary | ICD-10-CM

## 2013-03-03 DIAGNOSIS — E871 Hypo-osmolality and hyponatremia: Secondary | ICD-10-CM | POA: Insufficient documentation

## 2013-03-03 DIAGNOSIS — R000 Tachycardia, unspecified: Secondary | ICD-10-CM | POA: Insufficient documentation

## 2013-03-03 DIAGNOSIS — Z923 Personal history of irradiation: Secondary | ICD-10-CM | POA: Insufficient documentation

## 2013-03-03 DIAGNOSIS — Z8639 Personal history of other endocrine, nutritional and metabolic disease: Secondary | ICD-10-CM | POA: Insufficient documentation

## 2013-03-03 DIAGNOSIS — C7800 Secondary malignant neoplasm of unspecified lung: Secondary | ICD-10-CM | POA: Insufficient documentation

## 2013-03-03 DIAGNOSIS — R5381 Other malaise: Secondary | ICD-10-CM | POA: Insufficient documentation

## 2013-03-03 DIAGNOSIS — Z859 Personal history of malignant neoplasm, unspecified: Secondary | ICD-10-CM | POA: Insufficient documentation

## 2013-03-03 DIAGNOSIS — Z862 Personal history of diseases of the blood and blood-forming organs and certain disorders involving the immune mechanism: Secondary | ICD-10-CM | POA: Insufficient documentation

## 2013-03-03 DIAGNOSIS — Z87891 Personal history of nicotine dependence: Secondary | ICD-10-CM | POA: Insufficient documentation

## 2013-03-03 LAB — CBC WITH DIFFERENTIAL/PLATELET
Eosinophils Absolute: 0.1 10*3/uL (ref 0.0–0.7)
Eosinophils Relative: 2 % (ref 0–5)
Hemoglobin: 11.3 g/dL — ABNORMAL LOW (ref 13.0–17.0)
Lymphocytes Relative: 21 % (ref 12–46)
Lymphs Abs: 0.8 10*3/uL (ref 0.7–4.0)
MCH: 29.7 pg (ref 26.0–34.0)
MCV: 83.9 fL (ref 78.0–100.0)
Monocytes Relative: 22 % — ABNORMAL HIGH (ref 3–12)
Platelets: 225 10*3/uL (ref 150–400)
RBC: 3.8 MIL/uL — ABNORMAL LOW (ref 4.22–5.81)
WBC: 4 10*3/uL (ref 4.0–10.5)

## 2013-03-03 LAB — URINALYSIS, ROUTINE W REFLEX MICROSCOPIC
Bilirubin Urine: NEGATIVE
Ketones, ur: NEGATIVE mg/dL
Leukocytes, UA: NEGATIVE
Nitrite: NEGATIVE
Urobilinogen, UA: 0.2 mg/dL (ref 0.0–1.0)
pH: 7 (ref 5.0–8.0)

## 2013-03-03 LAB — COMPREHENSIVE METABOLIC PANEL
ALT: 22 U/L (ref 0–53)
Alkaline Phosphatase: 45 U/L (ref 39–117)
BUN: 15 mg/dL (ref 6–23)
CO2: 24 mEq/L (ref 19–32)
Calcium: 8.4 mg/dL (ref 8.4–10.5)
GFR calc Af Amer: 61 mL/min — ABNORMAL LOW (ref >90)
GFR calc non Af Amer: 53 mL/min — ABNORMAL LOW (ref >90)
Glucose, Bld: 135 mg/dL — ABNORMAL HIGH (ref 70–99)
Potassium: 4.5 mEq/L (ref 3.5–5.1)
Sodium: 124 mEq/L — ABNORMAL LOW (ref 135–145)
Total Protein: 5.4 g/dL — ABNORMAL LOW (ref 6.0–8.3)

## 2013-03-03 LAB — PROTIME-INR: Prothrombin Time: 13.6 seconds (ref 11.6–15.2)

## 2013-03-03 LAB — POCT I-STAT TROPONIN I: Troponin i, poc: 0.01 ng/mL (ref 0.00–0.08)

## 2013-03-03 MED ORDER — SODIUM CHLORIDE 0.9 % IV SOLN
1000.0000 mL | Freq: Once | INTRAVENOUS | Status: AC
  Administered 2013-03-03: 1000 mL via INTRAVENOUS

## 2013-03-03 MED ORDER — SODIUM CHLORIDE 0.9 % IV SOLN: 1000.0000 mL | INTRAVENOUS | Status: DC

## 2013-03-03 NOTE — Telephone Encounter (Signed)
[name] called stating that pt has had low BP and has gone to the ED now for IVF.  She wanted to know whether pt needs to have IVF at home due to his low BP.  Per Dr Donnald Garre, pt may have IVF at [facility] tomorrow 03/04/13 if he would like, or pt can be evaluated at f/u appt 9/18.  Left msg on home ph# with information.  SLJ

## 2013-03-03 NOTE — ED Notes (Signed)
Per EMS report: pt has issues with his BP since his last chemo.  EMS BP: 90/65.  Pt's wife reports pt has had bouts of dizziness but this episodes appear worse.  Pt's wife reports he is "not himself."  Pt a/o x 4.  Skin warm and dry.  Pt reports he is the "inability to focus."

## 2013-03-03 NOTE — ED Notes (Signed)
Bed: WA20 Expected date:  Expected time:  Means of arrival:  Comments: EMS 79yo M, unable to focus, not himself

## 2013-03-03 NOTE — ED Provider Notes (Signed)
CSN: [id number]     Arrival date & time 03/03/13  0701 History   First MD Initiated Contact with Patient 03/03/13 [phone]     Chief Complaint  Patient presents with  . Dizziness    HPI  Patient presents with his wife who assists with the history of present illness. Patient has concern of progressive fatigue, lightheadedness.  Symptoms have been present for some time, but has become worse over the past day. There is no focal pain, no syncope no vomiting or diarrhea. Symptoms have been progressive with no clear alleviating or exacerbating factors. Notably, the patient is undergoing chemotherapy for lung cancer. He completed radiation therapy, and has had 2 sessions of chemotherapy the last one week ago.   Past Medical History  Diagnosis Date  . BENIGN PROSTATIC HYPERTROPHY 07/23/2008  . 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 HIP PAIN, LEFT 07/04/2007  . HYPERLIPIDEMIA 01/02/2007  . HYPERTENSION 01/02/2007  . INSOMNIA 11/16/2008  . PROSTATE SPECIFIC ANTIGEN, ELEVATED 09/10/2008  . Lung cancer     metastatic non small cell lung cancer, adenocarcinoma    Past Surgical History  Procedure Laterality Date  . Transurethral resection of prostate  2004  . Total hip arthroplasty  2010, 2011    2010- left; 2011- right  . Upper gastrointestinal endoscopy     Family History  Problem Relation Age of Onset  . Colon polyps Neg Hx   . Stomach cancer Neg Hx   . Colon cancer Neg Hx   . Cancer Neg Hx    History  Substance Use Topics  . Smoking status: Former Smoker -- 1.00 packs/day for 40 years    Types: Cigarettes    Quit date: 06/18/1998  . Smokeless tobacco: Never Used  . Alcohol Use: No    Review of Systems  Constitutional: Positive for fatigue. Negative for fever and chills.  HENT: Positive for neck pain.   Eyes: Negative.   Respiratory: Negative.   Cardiovascular: Negative for chest pain.  Gastrointestinal: Negative.   Endocrine: Negative.   Genitourinary: Negative.   Skin:  Negative for rash.  Neurological: Positive for weakness and light-headedness. Negative for seizures, syncope and numbness.  Psychiatric/Behavioral: Negative.     Allergies  Vicodin  Home Medications   Current Outpatient Rx  Name  Route  Sig  Dispense  Refill  . amoxicillin-clavulanate (AUGMENTIN) 875-125 MG per tablet   Oral   Take 1 tablet by mouth 2 (two) times daily.   14 tablet   0   . cholecalciferol (VITAMIN D) 1000 UNITS tablet   Oral   Take 1,000 Units by mouth every morning.          . clotrimazole (MYCELEX) 10 MG troche               . dexamethasone (DECADRON) 4 MG tablet   Oral   Take 4 mg by mouth 2 (two) times daily. Take the day before, the day of, and the day after chemo treatment every 3 weeks         . diltiazem (DILACOR XR) 120 MG 24 hr capsule   Oral   Take 1 capsule (120 mg total) by mouth every morning.   30 capsule   5   . docusate sodium (COLACE) 250 MG capsule   Oral   Take 1 capsule (250 mg total) by mouth daily.   10 capsule   0   . fluconazole (DIFLUCAN) 200 MG tablet   Oral   Take 1 tablet (200  mg total) by mouth daily.   10 tablet   0   . folic acid (FOLVITE) 1 MG tablet   Oral   Take 1 mg by mouth every morning.         Marland Kitchen guaiFENesin-codeine (ROBITUSSIN AC) 100-10 MG/5ML syrup      Take 1-2 tsp up to every 6 hours if needed for cough.   120 mL   0   . guaiFENesin-dextromethorphan (ROBITUSSIN DM) 100-10 MG/5ML syrup   Oral   Take 5 mLs by mouth 4 (four) times daily as needed for cough.         Marland Kitchen levofloxacin (LEVAQUIN) 750 MG tablet               . lidocaine (XYLOCAINE) 2 % solution               . LORazepam (ATIVAN) 1 MG tablet   Oral   Take 1 mg by mouth every 8 (eight) hours as needed for anxiety.         . multivitamin-iron-minerals-folic acid (CENTRUM) chewable tablet   Oral   Chew 1 tablet by mouth every morning.          Marland Kitchen omeprazole (PRILOSEC) 40 MG capsule   Oral   Take 1 capsule  (40 mg total) by mouth 2 (two) times daily.   60 capsule   0   . ondansetron (ZOFRAN-ODT) 4 MG disintegrating tablet   Oral   Take 4 mg by mouth every 8 (eight) hours as needed for nausea. place one tablet under the tongue every 8 hours prn nausea         . polyethylene glycol (MIRALAX / GLYCOLAX) packet   Oral   Take 17 g by mouth daily as needed (constipation). Constipation.         . predniSONE (DELTASONE) 10 MG tablet               . tamsulosin (FLOMAX) 0.4 MG CAPS capsule                BP 96/66  Temp(Src) 98.5 F (36.9 C) (Oral)  Resp 17  SpO2 100% Physical Exam  Nursing note and vitals reviewed. Constitutional: He is oriented to person, place, and time. He appears ill.  Listless elderly male, pale, but awake and interactive.  HENT:  Head: Normocephalic and atraumatic.  Eyes: Conjunctivae and EOM are normal.  Cardiovascular: Regular rhythm.  Tachycardia present.   Pulmonary/Chest: Effort normal. No stridor. No respiratory distress.  Abdominal: He exhibits no distension.  Musculoskeletal: He exhibits no edema.  Neurological: He is alert and oriented to person, place, and time.  Skin: Skin is warm and dry.  Psychiatric: He has a normal mood and affect.    ED Course  Procedures (including critical care time) Labs Review Labs Reviewed  GLUCOSE, CAPILLARY - Abnormal; Notable for the following:    Glucose-Capillary 140 (*)    All other components within normal limits  URINE CULTURE  CULTURE, BLOOD (SINGLE)  CBC WITH DIFFERENTIAL  COMPREHENSIVE METABOLIC PANEL  URINALYSIS, ROUTINE W REFLEX MICROSCOPIC  LIPASE, BLOOD  PROCALCITONIN  PROTIME-INR   Imaging Review No results found. Pulse oximetry is 96% room air this is normal EKG has sinus tachycardia, rate 102,ltachycardic, abnormal Cardiac monitor has sinus rhythm, rate 110 otherwise unremarkable  I reviewed the patient's chart   9:17 AM On repeat exam the patient states that he feels  marginally better.  The pressure remains consistent. With his hyponatremia, hypochloremia  I discussed this with the patient and his family members.  Patient states that he would like to continue receiving fluid hydration, and if possible go home to follow up with his oncologist. Patient will be reevaluated following completion of initial fluid resuscitation.  12:06 PM Patient sleeping. He has been ambulatory. He is tolerating oral intake. I had another lengthy discussion with the patient's wife regarding need for close outpatient followup.  Given the patient's capacity to make decisions, his request for discharge was accommodated. However, The patient does have multiple medical problems, including ongoing therapy for lung cancer, and a generally poor prognosis.  MDM  No diagnosis found. This elderly male with ongoing therapy for lung cancer presents with weakness, near syncope, but no focal pain, no confusion, no disorientation, no fever. With his recent chemotherapy comparison suspicion for significant immunosuppression, but the patient's labs are largely reassuring, though there is evidence of hyponatremia, hypochloremia.  Patient resuscitation with IV fluids.  However, with his age and comorbidities I advocated for admission.  The patient deferred this suggestion, and has capacity to do so. Gross, the patient was encouraged to return for concerning changes in his condition, and a home health referral was made for evaluation of assistance available to the patient.     Gerhard Munch, MD 03/03/13 1208

## 2013-03-04 ENCOUNTER — Ambulatory Visit: Payer: Medicare Other

## 2013-03-04 ENCOUNTER — Other Ambulatory Visit: Payer: Medicare Other | Admitting: Lab

## 2013-03-04 ENCOUNTER — Encounter: Payer: Medicare Other | Admitting: Nutrition

## 2013-03-04 LAB — URINE CULTURE

## 2013-03-05 ENCOUNTER — Ambulatory Visit (HOSPITAL_BASED_OUTPATIENT_CLINIC_OR_DEPARTMENT_OTHER): Payer: Medicare Other | Admitting: Internal Medicine

## 2013-03-05 ENCOUNTER — Encounter: Payer: Self-pay | Admitting: Internal Medicine

## 2013-03-05 ENCOUNTER — Ambulatory Visit: Payer: Medicare Other | Admitting: Nutrition

## 2013-03-05 ENCOUNTER — Other Ambulatory Visit (HOSPITAL_BASED_OUTPATIENT_CLINIC_OR_DEPARTMENT_OTHER): Payer: Medicare Other

## 2013-03-05 ENCOUNTER — Other Ambulatory Visit: Payer: Self-pay | Admitting: Medical Oncology

## 2013-03-05 ENCOUNTER — Ambulatory Visit (HOSPITAL_BASED_OUTPATIENT_CLINIC_OR_DEPARTMENT_OTHER): Payer: Medicare Other

## 2013-03-05 VITALS — BP 124/81 | HR 89 | Temp 97.4°F | Resp 20 | Ht 68.0 in | Wt 166.5 lb

## 2013-03-05 DIAGNOSIS — C3490 Malignant neoplasm of unspecified part of unspecified bronchus or lung: Secondary | ICD-10-CM

## 2013-03-05 DIAGNOSIS — C3410 Malignant neoplasm of upper lobe, unspecified bronchus or lung: Secondary | ICD-10-CM

## 2013-03-05 DIAGNOSIS — Z5111 Encounter for antineoplastic chemotherapy: Secondary | ICD-10-CM

## 2013-03-05 DIAGNOSIS — R42 Dizziness and giddiness: Secondary | ICD-10-CM

## 2013-03-05 DIAGNOSIS — C3491 Malignant neoplasm of unspecified part of right bronchus or lung: Secondary | ICD-10-CM

## 2013-03-05 DIAGNOSIS — E860 Dehydration: Secondary | ICD-10-CM

## 2013-03-05 DIAGNOSIS — R5381 Other malaise: Secondary | ICD-10-CM

## 2013-03-05 DIAGNOSIS — R0609 Other forms of dyspnea: Secondary | ICD-10-CM

## 2013-03-05 LAB — CBC WITH DIFFERENTIAL/PLATELET
Basophils Absolute: 0 10*3/uL (ref 0.0–0.1)
EOS%: 0 % (ref 0.0–7.0)
HCT: 30.3 % — ABNORMAL LOW (ref 38.4–49.9)
HGB: 10.6 g/dL — ABNORMAL LOW (ref 13.0–17.1)
MCH: 29.2 pg (ref 27.2–33.4)
MONO#: 0.5 10*3/uL (ref 0.1–0.9)
NEUT#: 4.4 10*3/uL (ref 1.5–6.5)
RDW: 14.9 % — ABNORMAL HIGH (ref 11.0–14.6)
WBC: 5.8 10*3/uL (ref 4.0–10.3)
lymph#: 0.9 10*3/uL (ref 0.9–3.3)

## 2013-03-05 MED ORDER — DEXAMETHASONE SODIUM PHOSPHATE 20 MG/5ML IJ SOLN
INTRAMUSCULAR | Status: AC
  Filled 2013-03-05: qty 5

## 2013-03-05 MED ORDER — DEXAMETHASONE SODIUM PHOSPHATE 20 MG/5ML IJ SOLN
20.0000 mg | Freq: Once | INTRAMUSCULAR | Status: AC
  Administered 2013-03-05: 20 mg via INTRAVENOUS

## 2013-03-05 MED ORDER — SODIUM CHLORIDE 0.9 % IV SOLN
Freq: Once | INTRAVENOUS | Status: AC
  Administered 2013-03-05: 13:00:00 via INTRAVENOUS

## 2013-03-05 MED ORDER — SODIUM CHLORIDE 0.9 % IV SOLN
416.0000 mg | Freq: Once | INTRAVENOUS | Status: AC
  Administered 2013-03-05: 420 mg via INTRAVENOUS
  Filled 2013-03-05: qty 42

## 2013-03-05 MED ORDER — SODIUM CHLORIDE 0.9 % IV SOLN
500.0000 mg/m2 | Freq: Once | INTRAVENOUS | Status: AC
  Administered 2013-03-05: 1000 mg via INTRAVENOUS
  Filled 2013-03-05: qty 40

## 2013-03-05 MED ORDER — ONDANSETRON 16 MG/50ML IVPB (CHCC)
INTRAVENOUS | Status: AC
  Filled 2013-03-05: qty 16

## 2013-03-05 MED ORDER — ONDANSETRON 16 MG/50ML IVPB (CHCC)
16.0000 mg | Freq: Once | INTRAVENOUS | Status: AC
  Administered 2013-03-05: 16 mg via INTRAVENOUS

## 2013-03-05 MED ORDER — CYANOCOBALAMIN 1000 MCG/ML IJ SOLN
1000.0000 ug | Freq: Once | INTRAMUSCULAR | Status: AC
  Administered 2013-03-05: 1000 ug via INTRAMUSCULAR

## 2013-03-05 MED ORDER — CYANOCOBALAMIN 1000 MCG/ML IJ SOLN
INTRAMUSCULAR | Status: AC
  Filled 2013-03-05: qty 1

## 2013-03-05 NOTE — Telephone Encounter (Signed)
, °

## 2013-03-05 NOTE — Progress Notes (Signed)
Patient reports he is eating better the past few days.  His weight continues to decline and was documented as 166.5 pounds on September 18.  It is down approximately 5 pounds over the last month.  Patient is starting to consume a variety of foods.  He is enjoying eating a bit more.  He continues to be discouraged with his weight loss.  Patient reports he is drinking boost plus daily.  Nutrition diagnosis: Food and nutrition related knowledge deficit continues.  Intervention: I have educated patient on ways to increase calories and protein at meals and snacks he is consuming throughout the day.  I provided him with some additional high-calorie recipes for him to try. These focus on warm foods, which he tolerates better.  I've educated patient to strive for goal of weight maintenance.  Questions were answered.  Teach back method used.  Monitoring, evaluation, goals: Patient has been unable to increase calories and protein, and has had continued weight loss.  Next visit: Thursday, October 9, during chemotherapy.

## 2013-03-05 NOTE — Progress Notes (Signed)
[facility] [phone]   [phone]  OFFICE PROGRESS NOTE  Rogelia Boga, MD [address] [place] [address]  DIAGNOSIS AND STAGE: Metastatic non-small cell lung cancer, adenocarcinoma presented with left upper lobe lung mass in addition to extensive supraclavicular, mediastinal adenopathy and bilateral pulmonary nodules diagnosed in July of 2014. EGFR mutation as well as ALK gene translocation are negative.   PRIOR THERAPY: Palliative radiotherapy to the enlarged mediastinal and supraclavicular lymph nodes under the care of Dr. Kathrynn Running.   CURRENT THERAPY:  Systemic chemotherapy with carboplatin for AUC of 5 and Alimta 500 mg/M2 giving every 3 weeks. First dose given on 01/21/2013. Status post 2 cycles  CHEMOTHERAPY INTENT: Palliative  CURRENT # OF CHEMOTHERAPY CYCLES: 3 CURRENT ANTIEMETICS: Zofran, dexamethasone and Compazine  CURRENT SMOKING STATUS: currently a nonsmoker  ORAL CHEMOTHERAPY AND CONSENT: None  CURRENT BISPHOSPHONATES USE: None  PAIN MANAGEMENT: No pain  NARCOTICS INDUCED CONSTIPATION: N/A  LIVING WILL AND CODE STATUS: Full code   INTERVAL HISTORY: Tyler [name] 79 y.o. male returns to the clinic today for follow up visit accompanied by his girlfriend [place]. The patient is feeling fine today except for fatigue and hoarseness of his voice. He also has some dizzy spells especially with change in position over the last few weeks. He also has lack of appetite and doesn't drink enough fluids. He received IV hydration twice recently. The patient denied having any significant chest pain but continues to have shortness of breath with exertion. He has improvement in his swallowing after the radiation therapy. He denied having any significant weight loss or night sweats. He is tolerating his chemotherapy fairly well with no significant fever or chills. He has no nausea or vomiting. He has no bleeding issues. He is here  today to start cycle #3 of his chemotherapy.  MEDICAL HISTORY: Past Medical History  Diagnosis Date  . BENIGN PROSTATIC HYPERTROPHY 07/23/2008  . 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 HIP PAIN, LEFT 07/04/2007  . HYPERLIPIDEMIA 01/02/2007  . HYPERTENSION 01/02/2007  . INSOMNIA 11/16/2008  . PROSTATE SPECIFIC ANTIGEN, ELEVATED 09/10/2008  . Lung cancer     metastatic non small cell lung cancer, adenocarcinoma     ALLERGIES:  is allergic to vicodin.  MEDICATIONS:  Current Outpatient Prescriptions  Medication Sig Dispense Refill  . amoxicillin-clavulanate (AUGMENTIN) 875-125 MG per tablet Take 1 tablet by mouth 2 (two) times daily.  14 tablet  0  . cholecalciferol (VITAMIN D) 1000 UNITS tablet Take 1,000 Units by mouth every morning.       . clotrimazole (MYCELEX) 10 MG troche Take 10 mg by mouth 5 (five) times daily.      Marland Kitchen diltiazem (DILACOR XR) 120 MG 24 hr capsule Take 1 capsule (120 mg total) by mouth every morning.  30 capsule  5  . docusate sodium (COLACE) 250 MG capsule Take 1 capsule (250 mg total) by mouth daily.  10 capsule  0  . folic acid (FOLVITE) 1 MG tablet Take 1 mg by mouth every morning.      Marland Kitchen guaiFENesin-codeine (ROBITUSSIN AC) 100-10 MG/5ML syrup Take 1-2 tsp up to every 6 hours if needed for cough.  120 mL  0  . multivitamin-iron-minerals-folic acid (CENTRUM) chewable tablet Chew 1 tablet by mouth every morning.       Marland Kitchen omeprazole (PRILOSEC) 20 MG capsule Take 20 mg by mouth daily before breakfast.      . senna (SENOKOT) 8.6 MG tablet Take  1 tablet by mouth daily as needed for constipation.       No current facility-administered medications for this visit.    SURGICAL HISTORY:  Past Surgical History  Procedure Laterality Date  . Transurethral resection of prostate  2004  . Total hip arthroplasty  2010, 2011    2010- left; 2011- right  . Upper gastrointestinal endoscopy      REVIEW OF SYSTEMS:  Constitutional: positive for anorexia and fatigue Eyes:  negative Ears, nose, mouth, throat, and face: positive for hoarseness Respiratory: positive for dyspnea on exertion Cardiovascular: negative Gastrointestinal: negative Genitourinary:negative Integument/breast: negative Hematologic/lymphatic: negative Musculoskeletal:negative Neurological: negative Behavioral/Psych: negative Endocrine: negative Allergic/Immunologic: negative   PHYSICAL EXAMINATION: General appearance: alert, cooperative, fatigued and no distress Head: Normocephalic, without obvious abnormality, atraumatic Neck: no adenopathy, no JVD and supple, symmetrical, trachea midline Lymph nodes: Cervical, supraclavicular, and axillary nodes normal. Resp: clear to auscultation bilaterally and normal percussion bilaterally Back: symmetric, no curvature. ROM normal. No CVA tenderness. Cardio: regular rate and rhythm, S1, S2 normal, no murmur, click, rub or gallop GI: soft, non-tender; bowel sounds normal; no masses,  no organomegaly Extremities: extremities normal, atraumatic, no cyanosis or edema Neurologic: Alert and oriented X 3, normal strength and tone. Normal symmetric reflexes. Normal coordination and gait  ECOG PERFORMANCE STATUS: 1 - Symptomatic but completely ambulatory  Blood pressure 124/81, pulse 89, temperature 97.4 F (36.3 C), temperature source Oral, resp. rate 20, height 5\' 8"  (1.727 m), weight 166 lb 8 oz (75.524 kg), SpO2 99.00%.  LABORATORY DATA: Lab Results  Component Value Date   WBC 5.8 03/05/2013   HGB 10.6* 03/05/2013   HCT 30.3* 03/05/2013   MCV 83.5 03/05/2013   PLT 242 03/05/2013      Chemistry      Component Value Date/Time   NA 124* 03/03/2013 0720   NA 126* 02/26/2013 1127   K 4.5 03/03/2013 0720   K 4.5 02/26/2013 1127   CL 90* 03/03/2013 0720   CO2 24 03/03/2013 0720   CO2 26 02/26/2013 1127   BUN 15 03/03/2013 0720   BUN 20.2 02/26/2013 1127   CREATININE 1.27 03/03/2013 0720   CREATININE 1.5* 02/26/2013 1127      Component Value Date/Time     CALCIUM 8.4 03/03/2013 0720   CALCIUM 8.7 02/26/2013 1127   ALKPHOS 45 03/03/2013 0720   ALKPHOS 41 02/26/2013 1127   AST 19 03/03/2013 0720   AST 23 02/26/2013 1127   ALT 22 03/03/2013 0720   ALT 36 02/26/2013 1127   BILITOT 0.3 03/03/2013 0720   BILITOT 0.49 02/26/2013 1127       RADIOGRAPHIC STUDIES: Dg Chest Port 1 View  03/03/2013   CLINICAL DATA:  79 year old male dizziness. History of lung cancer.  EXAM: PORTABLE CHEST - 1 VIEW  COMPARISON:  01/25/2013 and earlier.  FINDINGS: Portable AP semi upright view at 0807 hrs. Decreased left lung volume. No significant change in increased left suprahilar density and spiculations extending into the left apex. No pneumothorax or pulmonary edema. No pleural effusion. Stable cardiac size and mediastinal contours. Visualized tracheal air column is within normal limits.  IMPRESSION: Decreased left lung volume. This might be treatment related if the patient has undergone XRT for the left lung mass. No superimposed pneumothorax, edema, or pleural effusion.   Electronically Signed   By: Augusto Gamble M.D.   On: 03/03/2013 08:29    ASSESSMENT AND PLAN: this is a very pleasant 79 years old white male with metastatic non-small cell lung  cancer, adenocarcinoma with negative EGFR mutation and negative ALK gene translocation status post palliative radiotherapy to the enlarged mediastinal and supraclavicular lymph nodes and he is currently undergoing systemic chemotherapy was carboplatin and Alimta status post 2 cycles. The patient is tolerating his treatment fairly well but he has few episodes of dizzy spells and hypotension secondary to dehydration from lack of enough oral intake. I strongly encouraged the patient to be small frequent meals in addition to increasing his oral fluid intake. He is followed by the dietitian at the cancer Center and she will advise him about how to improve his nutritional intake. We will proceed with cycle #3 today as scheduled. I advised  the patient and his girlfriend to call immediately early in the morning if he has any signs of dehydration so we can arrange for IV fluid If needed. I would see the patient back for followup visit with repeat CT scan of the neck, chest, abdomen and pelvis for restaging of his disease. The patient was advised to call immediately if he has any concerning symptoms in the interval.  The patient voices understanding of current disease status and treatment options and is in agreement with the current care plan.  All questions were answered. The patient knows to call the clinic with any problems, questions or concerns. We can certainly see the patient much sooner if necessary.  I spent 25 minutes counseling the patient face to face. The total time spent in the appointment was 35 minutes.

## 2013-03-05 NOTE — Patient Instructions (Addendum)
Cancer Center Discharge Instructions for Patients Receiving Chemotherapy  Today you received the following chemotherapy agents: alimta, carboplatin   To help prevent nausea and vomiting after your treatment, we encourage you to take your nausea medication.  Take it as often as prescribed.     If you develop nausea and vomiting that is not controlled by your nausea medication, call the clinic. If it is after clinic hours your family physician or the after hours number for the clinic or go to the Emergency Department.   BELOW ARE SYMPTOMS THAT SHOULD BE REPORTED IMMEDIATELY:  *FEVER GREATER THAN 100.5 F  *CHILLS WITH OR WITHOUT FEVER  NAUSEA AND VOMITING THAT IS NOT CONTROLLED WITH YOUR NAUSEA MEDICATION  *UNUSUAL SHORTNESS OF BREATH  *UNUSUAL BRUISING OR BLEEDING  TENDERNESS IN MOUTH AND THROAT WITH OR WITHOUT PRESENCE OF ULCERS  *URINARY PROBLEMS  *BOWEL PROBLEMS  UNUSUAL RASH Items with * indicate a potential emergency and should be followed up as soon as possible.  Feel free to call the clinic you have any questions or concerns. The clinic phone number is [phone].   I have been informed and understand all the instructions given to me. I know to contact the clinic, my physician, or go to the Emergency Department if any problems should occur. I do not have any questions at this time, but understand that I may call the clinic during office hours   should I have any questions or need assistance in obtaining follow up care.    __________________________________________  _____________  __________ Signature of Patient or Authorized Representative            Date                   Time    __________________________________________ Nurse's Signature    

## 2013-03-09 ENCOUNTER — Telehealth: Payer: Self-pay | Admitting: Internal Medicine

## 2013-03-09 ENCOUNTER — Ambulatory Visit: Payer: Medicare Other | Admitting: Pulmonary Disease

## 2013-03-09 LAB — CULTURE, BLOOD (SINGLE): Culture: NO GROWTH

## 2013-03-09 NOTE — Telephone Encounter (Signed)
ok 

## 2013-03-09 NOTE — Telephone Encounter (Signed)
I am not recommending that elderly patients switch to me if have current PCP at this time as will be out on maternity leave soon and then will have a reduced schedule - my next available medicare new patient visit (which he would need) is several months from now. So, would advise he stay with current PCP for the time being.

## 2013-03-09 NOTE — Telephone Encounter (Signed)
Pt would like to switch to you from Dr Amador Cunas, will you accept? Pt has DX w/ lung cancer. Pt would like to switch to dr Selena Batten, is that ok w/ you Dr Amador Cunas?

## 2013-03-11 ENCOUNTER — Other Ambulatory Visit: Payer: Medicare Other

## 2013-03-11 ENCOUNTER — Telehealth: Payer: Self-pay | Admitting: Internal Medicine

## 2013-03-11 NOTE — Telephone Encounter (Signed)
Advised pt of message.

## 2013-03-11 NOTE — Telephone Encounter (Signed)
Faxed pt medical records to [facility] [place] °

## 2013-03-12 ENCOUNTER — Telehealth: Payer: Self-pay | Admitting: *Deleted

## 2013-03-12 ENCOUNTER — Other Ambulatory Visit (HOSPITAL_BASED_OUTPATIENT_CLINIC_OR_DEPARTMENT_OTHER): Payer: Medicare Other

## 2013-03-12 ENCOUNTER — Other Ambulatory Visit: Payer: Medicare Other

## 2013-03-12 DIAGNOSIS — C3490 Malignant neoplasm of unspecified part of unspecified bronchus or lung: Secondary | ICD-10-CM

## 2013-03-12 LAB — CBC WITH DIFFERENTIAL/PLATELET
Basophils Absolute: 0 10*3/uL (ref 0.0–0.1)
HCT: 31.2 % — ABNORMAL LOW (ref 38.4–49.9)
HGB: 10.7 g/dL — ABNORMAL LOW (ref 13.0–17.1)
LYMPH%: 20.5 % (ref 14.0–49.0)
MCH: 29.8 pg (ref 27.2–33.4)
MONO#: 0.1 10*3/uL (ref 0.1–0.9)
NEUT%: 72.5 % (ref 39.0–75.0)
Platelets: 140 10*3/uL (ref 140–400)
WBC: 2.4 10*3/uL — ABNORMAL LOW (ref 4.0–10.3)
lymph#: 0.5 10*3/uL — ABNORMAL LOW (ref 0.9–3.3)

## 2013-03-12 LAB — COMPREHENSIVE METABOLIC PANEL (CC13)
BUN: 27.3 mg/dL — ABNORMAL HIGH (ref 7.0–26.0)
CO2: 25 mEq/L (ref 22–29)
Calcium: 8.7 mg/dL (ref 8.4–10.4)
Chloride: 98 mEq/L (ref 98–109)
Creatinine: 1.2 mg/dL (ref 0.7–1.3)

## 2013-03-12 NOTE — Telephone Encounter (Signed)
Office visit with Dr. Hyman Hopes at [facility] 03/10/13 given to Dr Donnald Garre to review. SLJ

## 2013-03-16 ENCOUNTER — Inpatient Hospital Stay (HOSPITAL_COMMUNITY)
Admission: EM | Admit: 2013-03-16 | Discharge: 2013-03-17 | DRG: 308 | Disposition: A | Discharge status: Home / Self Care | Payer: Medicare Other | Attending: Internal Medicine | Admitting: Internal Medicine

## 2013-03-16 ENCOUNTER — Encounter (HOSPITAL_COMMUNITY): Payer: Self-pay

## 2013-03-16 ENCOUNTER — Emergency Department (HOSPITAL_COMMUNITY): Payer: Medicare Other

## 2013-03-16 DIAGNOSIS — E785 Hyperlipidemia, unspecified: Secondary | ICD-10-CM | POA: Diagnosis present

## 2013-03-16 DIAGNOSIS — Z96649 Presence of unspecified artificial hip joint: Secondary | ICD-10-CM

## 2013-03-16 DIAGNOSIS — I319 Disease of pericardium, unspecified: Secondary | ICD-10-CM | POA: Diagnosis present

## 2013-03-16 DIAGNOSIS — Z79899 Other long term (current) drug therapy: Secondary | ICD-10-CM

## 2013-03-16 DIAGNOSIS — C3490 Malignant neoplasm of unspecified part of unspecified bronchus or lung: Secondary | ICD-10-CM | POA: Diagnosis present

## 2013-03-16 DIAGNOSIS — Z87891 Personal history of nicotine dependence: Secondary | ICD-10-CM

## 2013-03-16 DIAGNOSIS — N529 Male erectile dysfunction, unspecified: Secondary | ICD-10-CM | POA: Diagnosis present

## 2013-03-16 DIAGNOSIS — G4700 Insomnia, unspecified: Secondary | ICD-10-CM | POA: Diagnosis present

## 2013-03-16 DIAGNOSIS — D61818 Other pancytopenia: Secondary | ICD-10-CM | POA: Diagnosis present

## 2013-03-16 DIAGNOSIS — D61810 Antineoplastic chemotherapy induced pancytopenia: Secondary | ICD-10-CM | POA: Diagnosis present

## 2013-03-16 DIAGNOSIS — I4891 Unspecified atrial fibrillation: Principal | ICD-10-CM | POA: Diagnosis present

## 2013-03-16 DIAGNOSIS — E871 Hypo-osmolality and hyponatremia: Secondary | ICD-10-CM | POA: Diagnosis present

## 2013-03-16 DIAGNOSIS — C801 Malignant (primary) neoplasm, unspecified: Secondary | ICD-10-CM | POA: Diagnosis present

## 2013-03-16 DIAGNOSIS — I10 Essential (primary) hypertension: Secondary | ICD-10-CM | POA: Diagnosis present

## 2013-03-16 DIAGNOSIS — J449 Chronic obstructive pulmonary disease, unspecified: Secondary | ICD-10-CM | POA: Diagnosis present

## 2013-03-16 DIAGNOSIS — Z66 Do not resuscitate: Secondary | ICD-10-CM | POA: Diagnosis present

## 2013-03-16 DIAGNOSIS — N400 Enlarged prostate without lower urinary tract symptoms: Secondary | ICD-10-CM | POA: Diagnosis present

## 2013-03-16 DIAGNOSIS — E861 Hypovolemia: Secondary | ICD-10-CM | POA: Diagnosis present

## 2013-03-16 DIAGNOSIS — J4489 Other specified chronic obstructive pulmonary disease: Secondary | ICD-10-CM | POA: Diagnosis present

## 2013-03-16 DIAGNOSIS — E860 Dehydration: Secondary | ICD-10-CM | POA: Diagnosis present

## 2013-03-16 DIAGNOSIS — T451X5A Adverse effect of antineoplastic and immunosuppressive drugs, initial encounter: Secondary | ICD-10-CM | POA: Diagnosis present

## 2013-03-16 DIAGNOSIS — I959 Hypotension, unspecified: Secondary | ICD-10-CM | POA: Diagnosis present

## 2013-03-16 LAB — TSH: TSH: 0.819 u[IU]/mL (ref 0.350–4.500)

## 2013-03-16 LAB — BASIC METABOLIC PANEL
BUN: 24 mg/dL — ABNORMAL HIGH (ref 6–23)
CO2: 26 mEq/L (ref 19–32)
Chloride: 92 mEq/L — ABNORMAL LOW (ref 96–112)
Glucose, Bld: 119 mg/dL — ABNORMAL HIGH (ref 70–99)
Potassium: 4.2 mEq/L (ref 3.5–5.1)
Sodium: 128 mEq/L — ABNORMAL LOW (ref 135–145)

## 2013-03-16 LAB — URINALYSIS, ROUTINE W REFLEX MICROSCOPIC
Bilirubin Urine: NEGATIVE
Hgb urine dipstick: NEGATIVE
Specific Gravity, Urine: 1.015 (ref 1.005–1.030)
pH: 6.5 (ref 5.0–8.0)

## 2013-03-16 LAB — CBC
HCT: 26.9 % — ABNORMAL LOW (ref 39.0–52.0)
Hemoglobin: 9.4 g/dL — ABNORMAL LOW (ref 13.0–17.0)
MCHC: 34.9 g/dL (ref 30.0–36.0)
RBC: 3.18 MIL/uL — ABNORMAL LOW (ref 4.22–5.81)

## 2013-03-16 LAB — MAGNESIUM: Magnesium: 1.5 mg/dL (ref 1.5–2.5)

## 2013-03-16 LAB — TROPONIN I
Troponin I: <0.3 ng/mL (ref <0.30)
Troponin I: <0.3 ng/mL (ref <0.30)

## 2013-03-16 LAB — PROTIME-INR
INR: 1.17 (ref 0.00–1.49)
Prothrombin Time: 14.7 seconds (ref 11.6–15.2)

## 2013-03-16 MED ORDER — POLYETHYLENE GLYCOL 3350 17 G PO PACK
17.0000 g | PACK | Freq: Every day | ORAL | Status: DC | PRN
  Filled 2013-03-16: qty 1

## 2013-03-16 MED ORDER — SODIUM CHLORIDE 0.9 % IV SOLN
1000.0000 mL | Freq: Once | INTRAVENOUS | Status: AC
  Administered 2013-03-16: 1000 mL via INTRAVENOUS

## 2013-03-16 MED ORDER — DEXAMETHASONE 4 MG PO TABS: 4.0000 mg | ORAL_TABLET | Freq: Two times a day (BID) | ORAL | Status: DC

## 2013-03-16 MED ORDER — PANTOPRAZOLE SODIUM 40 MG PO TBEC
40.0000 mg | DELAYED_RELEASE_TABLET | Freq: Every day | ORAL | Status: DC
  Administered 2013-03-17: 11:00:00 40 mg via ORAL
  Filled 2013-03-16 (×2): qty 1

## 2013-03-16 MED ORDER — SODIUM CHLORIDE 0.9 % IJ SOLN: 3.0000 mL | Freq: Two times a day (BID) | INTRAMUSCULAR | Status: DC

## 2013-03-16 MED ORDER — DILTIAZEM LOAD VIA INFUSION: 15.0000 mg | Freq: Once | INTRAVENOUS | Status: DC

## 2013-03-16 MED ORDER — ALUM & MAG HYDROXIDE-SIMETH 200-200-20 MG/5ML PO SUSP: 30.0000 mL | Freq: Four times a day (QID) | ORAL | Status: DC | PRN

## 2013-03-16 MED ORDER — MAGNESIUM CITRATE PO SOLN: 1.0000 | Freq: Once | ORAL | Status: AC | PRN

## 2013-03-16 MED ORDER — ONDANSETRON HCL 4 MG/2ML IJ SOLN: 4.0000 mg | Freq: Four times a day (QID) | INTRAMUSCULAR | Status: DC | PRN

## 2013-03-16 MED ORDER — IPRATROPIUM BROMIDE 0.02 % IN SOLN: 0.5000 mg | RESPIRATORY_TRACT | Status: DC | PRN

## 2013-03-16 MED ORDER — DILTIAZEM HCL 100 MG IV SOLR: 5.0000 mg/h | INTRAVENOUS | Status: DC

## 2013-03-16 MED ORDER — TRAMADOL HCL 50 MG PO TABS: 50.0000 mg | ORAL_TABLET | Freq: Four times a day (QID) | ORAL | Status: DC | PRN

## 2013-03-16 MED ORDER — CITALOPRAM HYDROBROMIDE 10 MG PO TABS
10.0000 mg | ORAL_TABLET | Freq: Every day | ORAL | Status: DC | PRN
  Filled 2013-03-16: qty 1

## 2013-03-16 MED ORDER — DOCUSATE SODIUM 50 MG PO CAPS
250.0000 mg | ORAL_CAPSULE | Freq: Every day | ORAL | Status: DC
  Administered 2013-03-17: 250 mg via ORAL
  Filled 2013-03-16 (×2): qty 1

## 2013-03-16 MED ORDER — MORPHINE SULFATE 2 MG/ML IJ SOLN
1.0000 mg | INTRAMUSCULAR | Status: DC | PRN
  Filled 2013-03-16: qty 1

## 2013-03-16 MED ORDER — SODIUM CHLORIDE 0.9 % IV SOLN
INTRAVENOUS | Status: DC
  Administered 2013-03-16 – 2013-03-17 (×3): via INTRAVENOUS

## 2013-03-16 MED ORDER — ACETAMINOPHEN 325 MG PO TABS: 650.0000 mg | ORAL_TABLET | Freq: Four times a day (QID) | ORAL | Status: DC | PRN

## 2013-03-16 MED ORDER — PROCHLORPERAZINE MALEATE 10 MG PO TABS
10.0000 mg | ORAL_TABLET | Freq: Four times a day (QID) | ORAL | Status: DC | PRN
  Filled 2013-03-16: qty 1

## 2013-03-16 MED ORDER — SENNA 8.6 MG PO TABS: 1.0000 | ORAL_TABLET | Freq: Every day | ORAL | Status: DC | PRN

## 2013-03-16 MED ORDER — GUAIFENESIN-CODEINE 100-10 MG/5ML PO SOLN: 10.0000 mL | ORAL | Status: DC | PRN

## 2013-03-16 MED ORDER — FOLIC ACID 1 MG PO TABS
1.0000 mg | ORAL_TABLET | Freq: Every morning | ORAL | Status: DC
Start: 2013-03-16 — End: 2013-03-18
  Administered 2013-03-16 – 2013-03-17 (×2): 1 mg via ORAL
  Filled 2013-03-16 (×2): qty 1

## 2013-03-16 MED ORDER — ACETAMINOPHEN 650 MG RE SUPP: 650.0000 mg | Freq: Four times a day (QID) | RECTAL | Status: DC | PRN

## 2013-03-16 MED ORDER — ONDANSETRON HCL 4 MG PO TABS: 4.0000 mg | ORAL_TABLET | Freq: Four times a day (QID) | ORAL | Status: DC | PRN

## 2013-03-16 MED ORDER — SORBITOL 70 % SOLN
30.0000 mL | Freq: Every day | Status: DC | PRN
  Filled 2013-03-16: qty 30

## 2013-03-16 MED ORDER — LEVALBUTEROL HCL 0.63 MG/3ML IN NEBU: 0.6300 mg | INHALATION_SOLUTION | RESPIRATORY_TRACT | Status: DC | PRN

## 2013-03-16 MED ORDER — GUAIFENESIN-CODEINE 100-10 MG/5ML PO SYRP: 10.0000 mL | ORAL_SOLUTION | Freq: Three times a day (TID) | ORAL | Status: DC | PRN

## 2013-03-16 MED ORDER — SODIUM CHLORIDE 0.9 % IV SOLN
1000.0000 mL | INTRAVENOUS | Status: DC
  Administered 2013-03-16: 1000 mL via INTRAVENOUS

## 2013-03-16 NOTE — ED Provider Notes (Signed)
CSN: [id number]     Arrival date & time 03/16/13  0818 History   First MD Initiated Contact with Patient 03/16/13 [phone]     Chief Complaint  Patient presents with  . Weakness    HPI Patient reports getting up this morning and feeling generally weak and dizzy.  No syncope.  No chest pain or palpitations.  No shortness of breath.  He states his Cardizem was decreased from 240 once a day 220 mg once a day one week ago.  Over the past 2 days his blood pressures been less than 120s systolic and therefore he held his Cardizem per the instructions of his physician.  He has lung cancer and is currently getting chemotherapy.  His last chemotherapy was in the past week and a half.  He denies fevers and chills.  No productive cough.  No unilateral leg swelling.  No abdominal pain.  No nausea vomiting or diarrhea.  Mild anorexia over the past week.   Past Medical History  Diagnosis Date  . BENIGN PROSTATIC HYPERTROPHY 07/23/2008  . 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 HIP PAIN, LEFT 07/04/2007  . HYPERLIPIDEMIA 01/02/2007  . HYPERTENSION 01/02/2007  . INSOMNIA 11/16/2008  . PROSTATE SPECIFIC ANTIGEN, ELEVATED 09/10/2008  . Lung cancer     metastatic non small cell lung cancer, adenocarcinoma    Past Surgical History  Procedure Laterality Date  . Transurethral resection of prostate  2004  . Total hip arthroplasty  2010, 2011    2010- left; 2011- right  . Upper gastrointestinal endoscopy     Family History  Problem Relation Age of Onset  . Colon polyps Neg Hx   . Stomach cancer Neg Hx   . Colon cancer Neg Hx   . Cancer Neg Hx    History  Substance Use Topics  . Smoking status: Former Smoker -- 1.00 packs/day for 40 years    Types: Cigarettes    Quit date: 06/18/1998  . Smokeless tobacco: Never Used  . Alcohol Use: No    Review of Systems  All other systems reviewed and are negative.    Allergies  Vicodin  Home Medications   Current Outpatient Rx  Name  Route  Sig  Dispense   Refill  . cholecalciferol (VITAMIN D) 1000 UNITS tablet   Oral   Take 1,000 Units by mouth every morning.          . citalopram (CELEXA) 10 MG tablet   Oral   Take 10 mg by mouth daily as needed (depression).         Marland Kitchen dexamethasone (DECADRON) 4 MG tablet   Oral   Take 4 mg by mouth 2 (two) times daily with a meal. Take while on Chemo         . diltiazem (CARDIZEM CD) 120 MG 24 hr capsule      120 mg daily.         Marland Kitchen docusate sodium (COLACE) 250 MG capsule   Oral   Take 1 capsule (250 mg total) by mouth daily.   10 capsule   0   . folic acid (FOLVITE) 1 MG tablet   Oral   Take 1 mg by mouth every morning.         Marland Kitchen guaiFENesin-codeine (ROBITUSSIN AC) 100-10 MG/5ML syrup   Oral   Take 10 mLs by mouth 3 (three) times daily as needed for cough. Take 1-2 tsp up to every 6 hours if needed for cough.         Marland Kitchen  multivitamin-iron-minerals-folic acid (CENTRUM) chewable tablet   Oral   Chew 1 tablet by mouth every morning.          Marland Kitchen omeprazole (PRILOSEC) 20 MG capsule   Oral   Take 20 mg by mouth daily before breakfast.         . prochlorperazine (COMPAZINE) 10 MG tablet   Oral   Take 10 mg by mouth every 6 (six) hours as needed (nausea).          . senna (SENOKOT) 8.6 MG tablet   Oral   Take 1 tablet by mouth daily as needed for constipation.          BP 114/71  Pulse 87  Temp(Src) 97.8 F (36.6 C) (Oral)  Resp 16  SpO2 99% Physical Exam  Nursing note and vitals reviewed. Constitutional: He is oriented to person, place, and time. He appears well-developed and well-nourished.  HENT:  Head: Normocephalic and atraumatic.  Eyes: EOM are normal.  Neck: Normal range of motion.  Cardiovascular: Intact distal pulses.   Tachycardia.  Irregularly irregular  Pulmonary/Chest: Effort normal and breath sounds normal. No respiratory distress.  Abdominal: Soft. He exhibits no distension. There is no tenderness.  Musculoskeletal: Normal range of motion.   Neurological: He is alert and oriented to person, place, and time.  Skin: Skin is warm and dry.  Psychiatric: He has a normal mood and affect. Judgment normal.    ED Course  Procedures (including critical care time)  ECG interpretation  8:31 AM   Date: 03/16/2013  Rate: 147  Rhythm: Atrial fibrillation with rapid ventricular response  QRS Axis: normal  Intervals: normal  ST/T Wave abnormalities: normal  Conduction Disutrbances: none  Narrative Interpretation:   Old EKG Reviewed: changed from priorNo significant changes noted  ECG interpretation  9:43 AM   Date: 03/16/2013  Rate: 79  Rhythm: normal sinus rhythm  QRS Axis: normal  Intervals: normal  ST/T Wave abnormalities: normal  Conduction Disutrbances: none  Narrative Interpretation:   Old EKG Reviewed: Converted to normal sinus rhythm, no longer in atrial fibrillation       Labs Review Labs Reviewed  CBC - Abnormal; Notable for the following:    WBC 2.1 (*)    RBC 3.18 (*)    Hemoglobin 9.4 (*)    HCT 26.9 (*)    Platelets 52 (*)    All other components within normal limits  BASIC METABOLIC PANEL - Abnormal; Notable for the following:    Sodium 128 (*)    Chloride 92 (*)    Glucose, Bld 119 (*)    BUN 24 (*)    GFR calc non Af Amer 55 (*)    GFR calc Af Amer 64 (*)    All other components within normal limits  GLUCOSE, CAPILLARY - Abnormal; Notable for the following:    Glucose-Capillary 117 (*)    All other components within normal limits  CULTURE, BLOOD (ROUTINE X 2)  CULTURE, BLOOD (ROUTINE X 2)  URINE CULTURE  TROPONIN I  URINALYSIS, ROUTINE W REFLEX MICROSCOPIC  CG4 I-STAT (LACTIC ACID)   Imaging Review Dg Chest Portable 1 View  03/16/2013   CLINICAL DATA:  New onset atrial fibrillation, history hypertension, hyperlipidemia, lung cancer, former smoker, initial encounter  EXAM: PORTABLE CHEST - 1 VIEW  COMPARISON:  Portable exam 0853 hr compared to 03/03/2013  FINDINGS: Chronic elevation left  diaphragm.  Borderline enlargement of cardiac silhouette.  Calcified tortuous aorta.  Pulmonary vascularity normal.  Chronic accentuation  of interstitial markings in the mid to lower lungs similar to previous exam.  No definite acute infiltrate, pleural effusion or pneumothorax.  Question minimal chronic left basilar atelectasis.  Bones demineralized.  IMPRESSION: Suspect minimal chronic left basilar atelectasis with chronic elevation of left diaphragm.  No acute abnormalities.   Electronically Signed   By: Ulyses Southward M.D.   On: 03/16/2013 09:06   I personally reviewed the imaging tests through PACS system I reviewed available ER/hospitalization records through the EMR   MDM   1. Atrial fibrillation    New-onset atrial fibrillation that returned to normal sinus rhythm and converted spontaneously on its own.  Hypertensive on arrival.  Blood cultures obtained.  Lactate normal.  Doubt sepsis.  Much of his hypotension was likely secondary to volume depletion and may been related to his atrial fibrillation.  This has been off his Cardizem now for several days.  Questionable pancytopenia with new thrombocytopenia.  Active cancer patient in chemotherapy.  11:00 AM Spoke with [facility], will admit. Spoke with [facility] cardiology, will consult  Lyanne Co, MD 03/16/13 1104

## 2013-03-16 NOTE — ED Notes (Signed)
Bed: WA24 Expected date:  Expected time:  Means of arrival:  Comments: Hold for Hall A 

## 2013-03-16 NOTE — ED Notes (Signed)
Pt being treated for lung cancer. Finished radiation 6 weeks ago. Was seen in ED 9/16 for dehydration, received chemo 9/18, was seen by pcp 9/24. Pt reports he has been feeling weak and dehydrated. Denies pain. Very hypotensive at this time

## 2013-03-16 NOTE — Progress Notes (Signed)
   CARE MANAGEMENT ED NOTE 03/16/2013  Patient:  Tyler [name], Tyler [name]   Account Number:  [id number]  Date Initiated:  03/16/2013  Documentation initiated by:  Edd Arbour  Subjective/Objective Assessment:     Subjective/Objective Assessment Detail:   74/61 and his heart rate was 135 and noted to be in atrial fibrillation. Patient was bolused with IV fluids with subsequent improvement in his blood pressure. Patient also spontaneously converted back to normal sinus rhythm with a heart rate in the 80s.     Action/Plan:   Action/Plan Detail:   UR completed 03/16/13 1535 spoke with Dr Ramiro Harvest Order given to change to inpatient r/t low bp and ns boluses   Anticipated DC Date:  03/16/2013     Status Recommendation to Physician:  Recommend IP Status instead of OBV Result of Recommendation:  Agreed  Other ED Services  Consult Working Plan  Appropriate Status Consult    DC Planning Services  Other    Choice offered to / List presented to:            Status of service:  Completed, signed off  ED Comments:   ED Comments Detail:

## 2013-03-16 NOTE — ED Notes (Signed)
Patient currently in afib, no hx of afib

## 2013-03-16 NOTE — Progress Notes (Signed)
Echocardiogram 2D Echocardiogram has been performed.  Tyler [name] 03/16/2013, 1:17 PM

## 2013-03-16 NOTE — H&P (Signed)
[facility] History and Physical  BENSEN CHADDERDON AVW:[id number] DOB: 04/11/1933 DOA: 03/16/2013  Referring physician: Dr. Patria Mane PCP: Shirlean Mylar, D, MD  Specialists: Oncology: Dr. Shirline Frees Mohammed/radiation oncology: Dr. Kathrynn Running  Chief Complaint: Dizziness/weakness  HPI: Tyler [name] is a 79 y.o. male  Patient is a pleasant gentleman with history of hypertension on Cardizem, BPH, metastatic non-small cell lung cancer diagnosed in July of 2014 status post palliative radiotherapy to enlarged mediastinal and supraclavicular lymph nodes per Dr. Kathrynn Running currently on systemic chemotherapy with carboplatin to AUC of 5 and Altima 500 mg/M2 q. 3 weeks who is status post cycle #3 on 03/05/2013 who presents to the ED with dizziness and generalized weakness. Patient denies any syncope, no chest pain, no change in his chronic shortness of breath, no fever, no chills, no nausea, no vomiting, no palpitations, no abdominal pain, no diarrhea, no constipation. Patient does endorse generalized weakness and some decreased oral intake over the past few weeks that have been slowly improving. Patient states was on Cardizem 240 mg daily for his blood pressure however after significant weight loss of about 30 pounds in the drop of his blood and his blood pressure is Cardizem dose was decreased to 120 mg daily. Patient stated that he followup up with a new PCP Dr. Shirlean Mylar recently and due to his borderline blood pressure issues he was told not to take any of his Cardizem if his systolic blood pressure was less than 150. Due to that patient has only taken one dose of his Cardizem since then. Patient also endorses that his pulse been in the high 90s to the low 100s recently. Patient was seen in the emergency room and on presentation his systolic blood pressure was 74/61 and his heart rate was 135 and noted to be in atrial fibrillation. Patient was bolused with IV fluids with subsequent improvement in his blood pressure.  Patient also spontaneously converted back to normal sinus rhythm with a heart rate in the 80s. Labs obtained in the ED basic metabolic profile the sodium of 128 chloride of 92 BUN of 24 otherwise was within normal limits. First set of troponin was negative. Lactic acid level was 1.10. CBC had a white count of 2.1 hemoglobin of 9.4 and a platelet count of 52. Chest x-ray obtained was negative. Urinalysis was negative. We were consulted to admit the patient for further evaluation and management. Per ED physician cardiology has been consulted.  Review of Systems: The patient denies anorexia, fever, weight loss,, vision loss, decreased hearing, hoarseness, chest pain, syncope, dyspnea on exertion, peripheral edema, balance deficits, hemoptysis, abdominal pain, melena, hematochezia, severe indigestion/heartburn, hematuria, incontinence, genital sores, muscle weakness, suspicious skin lesions, transient blindness, difficulty walking, depression, unusual weight change, abnormal bleeding, enlarged lymph nodes, angioedema, and breast masses.   Past Medical History  Diagnosis Date  . BENIGN PROSTATIC HYPERTROPHY 07/23/2008  . 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 HIP PAIN, LEFT 07/04/2007  . HYPERLIPIDEMIA 01/02/2007  . HYPERTENSION 01/02/2007  . INSOMNIA 11/16/2008  . PROSTATE SPECIFIC ANTIGEN, ELEVATED 09/10/2008  . Lung cancer     metastatic non small cell lung cancer, adenocarcinoma    Past Surgical History  Procedure Laterality Date  . Transurethral resection of prostate  2004  . Total hip arthroplasty  2010, 2011    2010- left; 2011- right  . Upper gastrointestinal endoscopy     Social History:  reports that he quit smoking about 14 years ago. His smoking use included Cigarettes. He has a 40  pack-year smoking history. He has never used smokeless tobacco. He reports that he does not drink alcohol or use illicit drugs.  Allergies  Allergen Reactions  . Vicodin [Hydrocodone-Acetaminophen] Other  (See Comments)    hallucinations    Family History  Problem Relation Age of Onset  . Colon polyps Neg Hx   . Stomach cancer Neg Hx   . Colon cancer Neg Hx   . Cancer Neg Hx     Prior to Admission medications   Medication Sig Start Date End Date Taking? Authorizing Provider  cholecalciferol (VITAMIN D) 1000 UNITS tablet Take 1,000 Units by mouth every morning.    Yes Historical Provider, MD  citalopram (CELEXA) 10 MG tablet Take 10 mg by mouth daily as needed (depression).   Yes Historical Provider, MD  dexamethasone (DECADRON) 4 MG tablet Take 4 mg by mouth 2 (two) times daily with a meal. Take while on Chemo   Yes Historical Provider, MD  diltiazem (CARDIZEM CD) 120 MG 24 hr capsule 120 mg daily. 02/26/13  Yes Historical Provider, MD  docusate sodium (COLACE) 250 MG capsule Take 1 capsule (250 mg total) by mouth daily. 01/25/13  Yes Antony Madura, PA-C  folic acid (FOLVITE) 1 MG tablet Take 1 mg by mouth every morning.   Yes Historical Provider, MD  guaiFENesin-codeine (ROBITUSSIN AC) 100-10 MG/5ML syrup Take 10 mLs by mouth 3 (three) times daily as needed for cough. Take 1-2 tsp up to every 6 hours if needed for cough. 02/24/13  Yes Barbaraann Share, MD  multivitamin-iron-minerals-folic acid (CENTRUM) chewable tablet Chew 1 tablet by mouth every morning.    Yes Historical Provider, MD  omeprazole (PRILOSEC) 20 MG capsule Take 20 mg by mouth daily before breakfast.   Yes Historical Provider, MD  prochlorperazine (COMPAZINE) 10 MG tablet Take 10 mg by mouth every 6 (six) hours as needed (nausea).  01/13/13  Yes Historical Provider, MD  senna (SENOKOT) 8.6 MG tablet Take 1 tablet by mouth daily as needed for constipation.   Yes Historical Provider, MD   Physical Exam: Filed Vitals:   03/16/13 1030  BP: 114/71  Pulse: 87  Temp:   Resp: 16     General:  Elderly gentleman in no acute cardiopulmonary distress.  Eyes: Pupils equal round and reactive to light and accommodation. Extraocular  movements intact.  ENT: Oropharynx is clear, no lesions, no exudates. Dry mucous membranes.  Neck: Supple with no lymphadenopathy. No JVD.  Cardiovascular: Regular rate rhythm no murmurs rubs or gallops. No JVD. No lower extremity edema.  Respiratory: Clear to sedation bilaterally. No wheezes, no crackles, no rhonchi.  Abdomen: Soft, nontender, nondistended, positive bowel sounds.  Skin: No rashes or lesions.  Musculoskeletal: 4/5 BUE strength, 4/5 BLE strength  Psychiatric: Normal mood. Normal affect. Fair insight. Fair judgment.  Neurologic: Alert and oriented x3. Cranial nerves II through XII are grossly intact. No focal deficits. Sensation is intact. Visual fields are intact. Gait not tested secondary to safety.  Labs on Admission:  Basic Metabolic Panel:  Recent Labs Lab 03/12/13 1519 03/16/13 0830  NA 132* 128*  K 4.7 4.2  CL  --  92*  CO2 25 26  GLUCOSE 115 119*  BUN 27.3* 24*  CREATININE 1.2 1.22  CALCIUM 8.7 8.6   Liver Function Tests:  Recent Labs Lab 03/12/13 1519  AST 21  ALT 27  ALKPHOS 55  BILITOT 0.92  PROT 6.0*  ALBUMIN 2.9*   No results found for this basename: LIPASE, AMYLASE,  in the last 168 hours No results found for this basename: AMMONIA,  in the last 168 hours CBC:  Recent Labs Lab 03/12/13 1519 03/16/13 0830  WBC 2.4* 2.1*  NEUTROABS 1.8  --   HGB 10.7* 9.4*  HCT 31.2* 26.9*  MCV 87.1 84.6  PLT 140 52*   Cardiac Enzymes:  Recent Labs Lab 03/16/13 0830  TROPONINI <0.30    BNP (last 3 results) No results found for this basename: PROBNP,  in the last 8760 hours CBG:  Recent Labs Lab 03/16/13 0850  GLUCAP 117*    Radiological Exams on Admission: Dg Chest Portable 1 View  03/16/2013   CLINICAL DATA:  New onset atrial fibrillation, history hypertension, hyperlipidemia, lung cancer, former smoker, initial encounter  EXAM: PORTABLE CHEST - 1 VIEW  COMPARISON:  Portable exam 0853 hr compared to 03/03/2013  FINDINGS:  Chronic elevation left diaphragm.  Borderline enlargement of cardiac silhouette.  Calcified tortuous aorta.  Pulmonary vascularity normal.  Chronic accentuation of interstitial markings in the mid to lower lungs similar to previous exam.  No definite acute infiltrate, pleural effusion or pneumothorax.  Question minimal chronic left basilar atelectasis.  Bones demineralized.  IMPRESSION: Suspect minimal chronic left basilar atelectasis with chronic elevation of left diaphragm.  No acute abnormalities.   Electronically Signed   By: Ulyses Southward M.D.   On: 03/16/2013 09:06    EKG: Independently reviewed. Atrial fibrillation with RVR heart rate of 147. Repeat EKG done an hour 15 minutes later with normal sinus rhythm with Q waves in lead #3  Assessment/Plan Principal Problem:   New onset a-fib Active Problems:   HYPERLIPIDEMIA   HYPERTENSION   BENIGN PROSTATIC HYPERTROPHY   COPD, moderate   Non-small cell carcinoma of lung   Hypotension   Dehydration   Hyponatremia   Pancytopenia  #1 new onset atrial fibrillation Questionable etiology. Likely secondary to hypotension in the setting of metastatic non-small cell lung cancer,as patient had presented with a systolic blood pressure in the 70s which responded to IV fluids. Patient subsequently spontaneously converted to normal sinus rhythm. Will admit the patient to telemetry. Cycle cardiac enzymes every 6 hours x3. Check a TSH. Check a 2-D echo. Due to patient's borderline blood pressure issues will hold off on his Cardizem for now. We'll consult with cardiology for further evaluation and management, and defer Cardizem dose to cardiology. CHADS2 score = 2. Patient currently on chemotherapy and with a pancytopenia with this platelets of 52 today. Will hold off on any anticoagulation or aspirin at this time. Will defer to cardiology.  #2 hypotension Likely secondary to hypovolemia secondary to dehydration with poor oral intake versus secondary to problem  #1. Patient's blood pressure responded to fluids. We'll cycle cardiac enzymes every 6 hours x3. Patient with no overt GI bleed. Patient's hemoglobin is currently 9.4. Patient's chest x-ray is negative for any acute infiltrate. Urinalysis is negative for any UTI. Patient is currently afebrile and that if infectious etiology. Continue IV fluids. Follow.  #3 dehydration IV fluids.  #4 hyponatremia Likely secondary to hypovolemia. Patient noted to be hypotensive on admission. Chest x-ray with no acute infiltrate. Will check a TSH. Hydrated with IV fluids and follow.  #5 pancytopenia Likely chemotherapy induced. Patient with no active bleeding. We'll follow his counts. Transfusion threshold hemoglobin less than 7. Will transfuse platelets if platelet count is less than 10,000 or patient is bleeding. Inform oncology of admission.  #6 hyperlipidemia Check a fasting lipid panel.  #7 metastatic non-small cell  lung cancer Will consult with oncology for further evaluation and management.  #8 prophylaxis PPI for GI prophylaxis. SCDs for DVT prophylaxis.  Code Status: DO NOT RESUSCITATE Family Communication: Updated patient and girlfriend at bedside. Disposition Plan: Admit to telemetry  Time spent: 65 mins  [facility] Pager [phone]  If 7PM-7AM, please contact night-coverage [email or web address] Password [email or web address] 03/16/2013, 12:01 PM

## 2013-03-16 NOTE — Progress Notes (Signed)
Patient has arrived to assigned room 1437 from ED. Vitals stable. Patient has no complaints. Assessment complete. Wife at bedside. Will continue to monitor. Tyler [name]

## 2013-03-16 NOTE — ED Notes (Signed)
Patient tried to get up this morning and was dizzy. Did not fall. Has not been feeling well for the last couple week. Decreased appettite. Patient lives with wife. Patient is essentially self sufficient

## 2013-03-16 NOTE — ED Notes (Addendum)
Patient ambulated in hall without difficulty. Pt has converted to NSR

## 2013-03-17 ENCOUNTER — Other Ambulatory Visit: Payer: Self-pay | Admitting: Oncology

## 2013-03-17 DIAGNOSIS — D61810 Antineoplastic chemotherapy induced pancytopenia: Secondary | ICD-10-CM

## 2013-03-17 DIAGNOSIS — C3410 Malignant neoplasm of upper lobe, unspecified bronchus or lung: Secondary | ICD-10-CM

## 2013-03-17 DIAGNOSIS — I4891 Unspecified atrial fibrillation: Principal | ICD-10-CM

## 2013-03-17 DIAGNOSIS — I10 Essential (primary) hypertension: Secondary | ICD-10-CM

## 2013-03-17 DIAGNOSIS — C3490 Malignant neoplasm of unspecified part of unspecified bronchus or lung: Secondary | ICD-10-CM

## 2013-03-17 LAB — LIPID PANEL
Cholesterol: 131 mg/dL (ref 0–200)
HDL: 48 mg/dL (ref >39)
Total CHOL/HDL Ratio: 2.7 RATIO
Triglycerides: 89 mg/dL (ref <150)
VLDL: 18 mg/dL (ref 0–40)

## 2013-03-17 LAB — CBC WITH DIFFERENTIAL/PLATELET
Eosinophils Absolute: 0 10*3/uL (ref 0.0–0.7)
Eosinophils Relative: 0 % (ref 0–5)
HCT: 30.9 % — ABNORMAL LOW (ref 39.0–52.0)
Hemoglobin: 10.9 g/dL — ABNORMAL LOW (ref 13.0–17.0)
Lymphocytes Relative: 22 % (ref 12–46)
Lymphs Abs: 0.7 10*3/uL (ref 0.7–4.0)
MCH: 29.5 pg (ref 26.0–34.0)
MCV: 83.7 fL (ref 78.0–100.0)
Monocytes Absolute: 0.4 10*3/uL (ref 0.1–1.0)
Monocytes Relative: 13 % — ABNORMAL HIGH (ref 3–12)
Neutro Abs: 1.9 10*3/uL (ref 1.7–7.7)
Platelets: 40 10*3/uL — ABNORMAL LOW (ref 150–400)
RBC: 3.69 MIL/uL — ABNORMAL LOW (ref 4.22–5.81)
WBC: 3 10*3/uL — ABNORMAL LOW (ref 4.0–10.5)

## 2013-03-17 LAB — BASIC METABOLIC PANEL
CO2: 24 mEq/L (ref 19–32)
Calcium: 8.1 mg/dL — ABNORMAL LOW (ref 8.4–10.5)
GFR calc Af Amer: 90 mL/min — ABNORMAL LOW (ref >90)
Glucose, Bld: 99 mg/dL (ref 70–99)
Sodium: 130 mEq/L — ABNORMAL LOW (ref 135–145)

## 2013-03-17 LAB — URINE CULTURE
Colony Count: NO GROWTH
Culture: NO GROWTH

## 2013-03-17 LAB — CBC
Hemoglobin: 7.9 g/dL — ABNORMAL LOW (ref 13.0–17.0)
MCH: 29.3 pg (ref 26.0–34.0)
MCV: 84.4 fL (ref 78.0–100.0)
Platelets: 39 10*3/uL — ABNORMAL LOW (ref 150–400)
RBC: 2.7 MIL/uL — ABNORMAL LOW (ref 4.22–5.81)

## 2013-03-17 LAB — TROPONIN I: Troponin I: <0.3 ng/mL (ref <0.30)

## 2013-03-17 MED ORDER — BOOST / RESOURCE BREEZE PO LIQD: 1.0000 | ORAL | Status: DC

## 2013-03-17 MED ORDER — TRAMADOL HCL 50 MG PO TABS: 50.0000 mg | ORAL_TABLET | Freq: Four times a day (QID) | ORAL | Status: DC | PRN

## 2013-03-17 MED ORDER — HYDRALAZINE HCL 20 MG/ML IJ SOLN
10.0000 mg | Freq: Four times a day (QID) | INTRAMUSCULAR | Status: DC | PRN
  Administered 2013-03-17: 10 mg via INTRAVENOUS
  Filled 2013-03-17: qty 1

## 2013-03-17 MED ORDER — DIPHENHYDRAMINE HCL 25 MG PO CAPS
25.0000 mg | ORAL_CAPSULE | Freq: Once | ORAL | Status: AC
  Administered 2013-03-17: 25 mg via ORAL
  Filled 2013-03-17: qty 1

## 2013-03-17 MED ORDER — DILTIAZEM HCL 30 MG PO TABS: 30.0000 mg | ORAL_TABLET | Freq: Four times a day (QID) | ORAL | Status: DC | PRN

## 2013-03-17 MED ORDER — FUROSEMIDE 10 MG/ML IJ SOLN
20.0000 mg | Freq: Once | INTRAMUSCULAR | Status: AC
  Administered 2013-03-17: 20 mg via INTRAVENOUS

## 2013-03-17 MED ORDER — ACETAMINOPHEN 325 MG PO TABS
650.0000 mg | ORAL_TABLET | Freq: Once | ORAL | Status: AC
  Administered 2013-03-17: 650 mg via ORAL
  Filled 2013-03-17: qty 2

## 2013-03-17 MED ORDER — BOOST / RESOURCE BREEZE PO LIQD
1.0000 | ORAL | Status: DC
  Administered 2013-03-17: 1 via ORAL

## 2013-03-17 NOTE — Plan of Care (Deleted)
Problem: Acute Rehab OT Goals (only OT should resolve) Goal: OT Additional ADL Goal #1 Pt will verbalize 3 energy conservation techniques for ADL safety

## 2013-03-17 NOTE — Progress Notes (Signed)
DIAGNOSIS AND STAGE: Metastatic non-small cell lung cancer, adenocarcinoma presented with left upper lobe lung mass in addition to extensive supraclavicular, mediastinal adenopathy and bilateral pulmonary nodules diagnosed in July of 2014. EGFR mutation as well as ALK gene translocation are negative.   PRIOR THERAPY: Palliative radiotherapy to the enlarged mediastinal and supraclavicular lymph nodes under the care of Dr. Kathrynn Running.   CURRENT THERAPY:  Systemic chemotherapy with carboplatin for AUC of 5 and Alimta 500 mg/M2 giving every 3 weeks. First dose given on 01/21/2013. Status post 2 cycles   CHEMOTHERAPY INTENT: Palliative  CURRENT # OF CHEMOTHERAPY CYCLES: 3  CURRENT ANTIEMETICS: Zofran, dexamethasone and Compazine  CURRENT SMOKING STATUS: currently a nonsmoker  ORAL CHEMOTHERAPY AND CONSENT: None  CURRENT BISPHOSPHONATES USE: None  PAIN MANAGEMENT: No pain  NARCOTICS INDUCED CONSTIPATION: N/A  LIVING WILL AND CODE STATUS: Full code   Subjective: The patient is seen and examined today. His girlfriend as well as his daughter from [place] were at the bedside. He was admitted yesterday with dizziness and weakness and he was found to have hypotension. The patient and his girlfriend mentioned that he was eating much better recently and didn't include at regular basis. On admission he was found to have new onset atrial fibrillation. His CBC also showed low white blood count of 2.1, hemoglobin 7.9 and platelets count of 39,000. The patient is feeling much better this morning and he was eating his breakfast when I saw him. He continues to have mild fatigue and occasional dizzy spells.  Objective: Vital signs in last 24 hours: Temp:  [97.4 F (36.3 C)-98.8 F (37.1 C)] 97.4 F (36.3 C) (09/30 1745) Pulse Rate:  [80-93] 92 (09/30 1745) Resp:  [18-20] 18 (09/30 1745) BP: (108-168)/(66-110) 168/110 mmHg (09/30 1745) SpO2:  [95 %-100 %] 99 % (09/30 1745) Weight:  [165 lb 6.4 oz (75.025  kg)] 165 lb 6.4 oz (75.025 kg) (09/30 0542)  Intake/Output from previous day: 09/29 0701 - 09/30 0700 In: 3323.3 [P.O.:840; I.V.:2483.3] Out: 2325 [Urine:2325] Intake/Output this shift: Total I/O In: 480 [P.O.:480] Out: 200 [Urine:200]  General appearance: alert, cooperative, fatigued and no distress Resp: clear to auscultation bilaterally Cardio: irregularly irregular rhythm GI: soft, non-tender; bowel sounds normal; no masses,  no organomegaly Extremities: extremities normal, atraumatic, no cyanosis or edema  Lab Results:   Recent Labs  03/16/13 0830 03/17/13 0522  WBC 2.1* 2.1*  HGB 9.4* 7.9*  HCT 26.9* 22.8*  PLT 52* 39*   BMET  Recent Labs  03/16/13 0830 03/17/13 0522  NA 128* 130*  K 4.2 3.9  CL 92* 99  CO2 26 24  GLUCOSE 119* 99  BUN 24* 14  CREATININE 1.22 0.98  CALCIUM 8.6 8.1*    Studies/Results: Dg Chest Portable 1 View  03/16/2013   CLINICAL DATA:  New onset atrial fibrillation, history hypertension, hyperlipidemia, lung cancer, former smoker, initial encounter  EXAM: PORTABLE CHEST - 1 VIEW  COMPARISON:  Portable exam 0853 hr compared to 03/03/2013  FINDINGS: Chronic elevation left diaphragm.  Borderline enlargement of cardiac silhouette.  Calcified tortuous aorta.  Pulmonary vascularity normal.  Chronic accentuation of interstitial markings in the mid to lower lungs similar to previous exam.  No definite acute infiltrate, pleural effusion or pneumothorax.  Question minimal chronic left basilar atelectasis.  Bones demineralized.  IMPRESSION: Suspect minimal chronic left basilar atelectasis with chronic elevation of left diaphragm.  No acute abnormalities.   Electronically Signed   By: Ulyses Southward M.D.   On: 03/16/2013 09:06  Medications: I have reviewed the patient's current medications.  Assessment/Plan: 1) metastatic non-small cell lung cancer: Status post 3 cycles of systemic chemotherapy with carboplatin and Alimta. The patient is scheduled for  repeat CT scan of the chest, abdomen and pelvis in one week for restaging of his disease. 2) pancytopenia: Secondary to recent chemotherapy. I would recommend for the patient to receive 2 units of PRBCs transfusion during his admission. Will continue to monitor the white blood count as well as platelets counts closely. No need for platelet transfusion unless the patient is bleeding or has platelets count less than 10,000. 3) new onset atrial fibrillation: Consider cardiology evaluation. I had a lengthy discussion with the patient as well as his daughter about his current condition and prognosis. His daughter is very concerned about continuing chemotherapy for the patient especially with his current condition and she ask and several question about his prognosis and with her palliative care is appropriate at this point. She did not ask these questions and frontal the patient and she followed me outside the room to ask her questions.  We decided to wait until the restaging scan before making any further decisions regarding his treatment. Thank you for taking good care of Mr. Smail. I will continue to followup the patient with you and assist in his management on as-needed basis.   LOS: 1 day    Kevia Zaucha K. 03/17/2013

## 2013-03-17 NOTE — Consult Note (Signed)
Reason for Consult: Atrial fibrillation Referring Physician: Dr. Ramiro Harvest Primary cardiologist Dr. Delane Ginger  Tyler [name] is an 79 y.o. male.  HPI: This 79 year old male with a history of metastatic non-small cell lung cancer was admitted yesterday because of weakness and dizziness.  On presentation to the emergency room he was hypotensive and was given a bolus of saline with subsequent improvement in his blood pressure.  His heart rate which had been 135 in atrial fibrillation also at that point converted back to normal sinus rhythm.  The patient has a past history of hypertension and had been on diltiazem which recently had been reduced because of low blood pressure.  The patient has been undergoing chemotherapy for his lung cancer.  The patient had seen Dr. Elease Hashimoto in July 2014 because of the finding on echocardiogram of a pericardial effusion of moderate size but no evidence of hemodynamic compromise.  The patient had a repeat echocardiogram on 03/16/13 which showed a small pericardial effusion and again there was no evidence of hemodynamic compromise.  The patient denies any chest pain.  He does note overall weakness and he is significantly anemic with hemoglobin dropping today to 7.9 from 9.4 yesterday.  The patient has pancytopenia with platelet count of 39,000 and a white count of 2100  Past Medical History  Diagnosis Date  . BENIGN PROSTATIC HYPERTROPHY 07/23/2008  . 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 HIP PAIN, LEFT 07/04/2007  . HYPERLIPIDEMIA 01/02/2007  . HYPERTENSION 01/02/2007  . INSOMNIA 11/16/2008  . PROSTATE SPECIFIC ANTIGEN, ELEVATED 09/10/2008  . Lung cancer     metastatic non small cell lung cancer, adenocarcinoma     Past Surgical History  Procedure Laterality Date  . Transurethral resection of prostate  2004  . Total hip arthroplasty  2010, 2011    2010- left; 2011- right  . Upper gastrointestinal endoscopy      Family History  Problem Relation Age of Onset    . Colon polyps Neg Hx   . Stomach cancer Neg Hx   . Colon cancer Neg Hx   . Cancer Neg Hx     Social History:  reports that he quit smoking about 14 years ago. His smoking use included Cigarettes. He has a 40 pack-year smoking history. He has never used smokeless tobacco. He reports that he does not drink alcohol or use illicit drugs.  Allergies:  Allergies  Allergen Reactions  . Vicodin [Hydrocodone-Acetaminophen] Other (See Comments)    hallucinations    Medications:  Prior to Admission:  Prescriptions prior to admission  Medication Sig Dispense Refill  . cholecalciferol (VITAMIN D) 1000 UNITS tablet Take 1,000 Units by mouth every morning.       . citalopram (CELEXA) 10 MG tablet Take 10 mg by mouth daily as needed (depression).      Marland Kitchen dexamethasone (DECADRON) 4 MG tablet Take 4 mg by mouth 2 (two) times daily with a meal. Take while on Chemo      . diltiazem (CARDIZEM CD) 120 MG 24 hr capsule 120 mg daily.      Marland Kitchen docusate sodium (COLACE) 250 MG capsule Take 1 capsule (250 mg total) by mouth daily.  10 capsule  0  . folic acid (FOLVITE) 1 MG tablet Take 1 mg by mouth every morning.      Marland Kitchen guaiFENesin-codeine (ROBITUSSIN AC) 100-10 MG/5ML syrup Take 10 mLs by mouth 3 (three) times daily as needed for cough. Take 1-2 tsp up to every 6 hours if needed for  cough.      . multivitamin-iron-minerals-folic acid (CENTRUM) chewable tablet Chew 1 tablet by mouth every morning.       Marland Kitchen omeprazole (PRILOSEC) 20 MG capsule Take 20 mg by mouth daily before breakfast.      . prochlorperazine (COMPAZINE) 10 MG tablet Take 10 mg by mouth every 6 (six) hours as needed (nausea).       . senna (SENOKOT) 8.6 MG tablet Take 1 tablet by mouth daily as needed for constipation.        Results for orders placed during the hospital encounter of 03/16/13 (from the past 48 hour(s))  CBC     Status: Abnormal   Collection Time    03/16/13  8:30 AM      Result Value Range   WBC 2.1 (*) 4.0 - 10.5 K/uL    RBC 3.18 (*) 4.22 - 5.81 MIL/uL   Hemoglobin 9.4 (*) 13.0 - 17.0 g/dL   HCT 09.8 (*) 11.9 - 14.7 %   MCV 84.6  78.0 - 100.0 fL   MCH 29.6  26.0 - 34.0 pg   MCHC 34.9  30.0 - 36.0 g/dL   RDW 82.9  56.2 - 13.0 %   Platelets 52 (*) 150 - 400 K/uL   Comment: SPECIMEN CHECKED FOR CLOTS     REPEATED TO VERIFY     PLATELET COUNT CONFIRMED BY SMEAR  BASIC METABOLIC PANEL     Status: Abnormal   Collection Time    03/16/13  8:30 AM      Result Value Range   Sodium 128 (*) 135 - 145 mEq/L   Potassium 4.2  3.5 - 5.1 mEq/L   Chloride 92 (*) 96 - 112 mEq/L   CO2 26  19 - 32 mEq/L   Glucose, Bld 119 (*) 70 - 99 mg/dL   BUN 24 (*) 6 - 23 mg/dL   Creatinine, Ser 8.65  0.50 - 1.35 mg/dL   Calcium 8.6  8.4 - 78.4 mg/dL   GFR calc non Af Amer 55 (*) >90 mL/min   GFR calc Af Amer 64 (*) >90 mL/min   Comment: (NOTE)     The eGFR has been calculated using the CKD EPI equation.     This calculation has not been validated in all clinical situations.     eGFR's persistently <90 mL/min signify possible Chronic Kidney     Disease.  TROPONIN I     Status: None   Collection Time    03/16/13  8:30 AM      Result Value Range   Troponin I <0.30  <0.30 ng/mL   Comment:            Due to the release kinetics of cTnI,     a negative result within the first hours     of the onset of symptoms does not rule out     myocardial infarction with certainty.     If myocardial infarction is still suspected,     repeat the test at appropriate intervals.  MAGNESIUM     Status: None   Collection Time    03/16/13  8:30 AM      Result Value Range   Magnesium 1.5  1.5 - 2.5 mg/dL  PHOSPHORUS     Status: None   Collection Time    03/16/13  8:30 AM      Result Value Range   Phosphorus 3.6  2.3 - 4.6 mg/dL  CG4 I-STAT (LACTIC ACID)  Status: None   Collection Time    03/16/13  8:47 AM      Result Value Range   Lactic Acid, Venous 1.10  0.5 - 2.2 mmol/L  GLUCOSE, CAPILLARY     Status: Abnormal   Collection Time     03/16/13  8:50 AM      Result Value Range   Glucose-Capillary 117 (*) 70 - 99 mg/dL  CULTURE, BLOOD (ROUTINE X 2)     Status: None   Collection Time    03/16/13  9:00 AM      Result Value Range   Specimen Description BLOOD LEFT ANTECUBITAL     Special Requests BOTTLES DRAWN AEROBIC AND ANAEROBIC 5CC     Culture  Setup Time       Value: 03/16/2013 13:35     Performed at [facility]   Culture       Value:        BLOOD CULTURE RECEIVED NO GROWTH TO DATE CULTURE WILL BE HELD FOR 5 DAYS BEFORE ISSUING A FINAL NEGATIVE REPORT     Performed at [facility]   Report Status PENDING    URINALYSIS, ROUTINE W REFLEX MICROSCOPIC     Status: None   Collection Time    03/16/13  9:28 AM      Result Value Range   Color, Urine YELLOW  YELLOW   APPearance CLEAR  CLEAR   Specific Gravity, Urine 1.015  1.005 - 1.030   pH 6.5  5.0 - 8.0   Glucose, UA NEGATIVE  NEGATIVE mg/dL   Hgb urine dipstick NEGATIVE  NEGATIVE   Bilirubin Urine NEGATIVE  NEGATIVE   Ketones, ur NEGATIVE  NEGATIVE mg/dL   Protein, ur NEGATIVE  NEGATIVE mg/dL   Urobilinogen, UA 0.2  0.0 - 1.0 mg/dL   Nitrite NEGATIVE  NEGATIVE   Leukocytes, UA NEGATIVE  NEGATIVE   Comment: MICROSCOPIC NOT DONE ON URINES WITH NEGATIVE PROTEIN, BLOOD, LEUKOCYTES, NITRITE, OR GLUCOSE <1000 mg/dL.  CULTURE, BLOOD (ROUTINE X 2)     Status: None   Collection Time    03/16/13 10:10 AM      Result Value Range   Specimen Description BLOOD LEFT FOREARM     Special Requests BOTTLES DRAWN AEROBIC AND ANAEROBIC     Culture  Setup Time       Value: 03/16/2013 13:35     Performed at [facility]   Culture       Value:        BLOOD CULTURE RECEIVED NO GROWTH TO DATE CULTURE WILL BE HELD FOR 5 DAYS BEFORE ISSUING A FINAL NEGATIVE REPORT     Performed at [facility]   Report Status PENDING    TSH     Status: None   Collection Time    03/16/13  2:22 PM      Result Value Range   TSH 0.819  0.350 - 4.500 uIU/mL    Comment: Performed at [facility]  PROTIME-INR     Status: None   Collection Time    03/16/13  2:22 PM      Result Value Range   Prothrombin Time 14.7  11.6 - 15.2 seconds   INR 1.17  0.00 - 1.49  APTT     Status: Abnormal   Collection Time    03/16/13  2:22 PM      Result Value Range   aPTT 41 (*) 24 - 37 seconds   Comment:  IF BASELINE aPTT IS ELEVATED,     SUGGEST PATIENT RISK ASSESSMENT     BE USED TO DETERMINE APPROPRIATE     ANTICOAGULANT THERAPY.  TROPONIN I     Status: None   Collection Time    03/16/13  2:22 PM      Result Value Range   Troponin I <0.30  <0.30 ng/mL   Comment:            Due to the release kinetics of cTnI,     a negative result within the first hours     of the onset of symptoms does not rule out     myocardial infarction with certainty.     If myocardial infarction is still suspected,     repeat the test at appropriate intervals.  TROPONIN I     Status: None   Collection Time    03/16/13  8:21 PM      Result Value Range   Troponin I <0.30  <0.30 ng/mL   Comment:            Due to the release kinetics of cTnI,     a negative result within the first hours     of the onset of symptoms does not rule out     myocardial infarction with certainty.     If myocardial infarction is still suspected,     repeat the test at appropriate intervals.  TROPONIN I     Status: None   Collection Time    03/17/13  5:22 AM      Result Value Range   Troponin I <0.30  <0.30 ng/mL   Comment:            Due to the release kinetics of cTnI,     a negative result within the first hours     of the onset of symptoms does not rule out     myocardial infarction with certainty.     If myocardial infarction is still suspected,     repeat the test at appropriate intervals.  BASIC METABOLIC PANEL     Status: Abnormal   Collection Time    03/17/13  5:22 AM      Result Value Range   Sodium 130 (*) 135 - 145 mEq/L   Potassium 3.9  3.5 - 5.1 mEq/L    Chloride 99  96 - 112 mEq/L   CO2 24  19 - 32 mEq/L   Glucose, Bld 99  70 - 99 mg/dL   BUN 14  6 - 23 mg/dL   Creatinine, Ser 1.61  0.50 - 1.35 mg/dL   Calcium 8.1 (*) 8.4 - 10.5 mg/dL   GFR calc non Af Amer 77 (*) >90 mL/min   GFR calc Af Amer 90 (*) >90 mL/min   Comment: (NOTE)     The eGFR has been calculated using the CKD EPI equation.     This calculation has not been validated in all clinical situations.     eGFR's persistently <90 mL/min signify possible Chronic Kidney     Disease.  CBC     Status: Abnormal   Collection Time    03/17/13  5:22 AM      Result Value Range   WBC 2.1 (*) 4.0 - 10.5 K/uL   RBC 2.70 (*) 4.22 - 5.81 MIL/uL   Hemoglobin 7.9 (*) 13.0 - 17.0 g/dL   HCT 09.6 (*) 04.5 - 40.9 %   MCV 84.4  78.0 - 100.0  fL   MCH 29.3  26.0 - 34.0 pg   MCHC 34.6  30.0 - 36.0 g/dL   RDW 16.1  09.6 - 04.5 %   Platelets 39 (*) 150 - 400 K/uL   Comment: DELTA CHECK NOTED     SPECIMEN CHECKED FOR CLOTS     REPEATED TO VERIFY     PLATELET COUNT CONFIRMED BY SMEAR  PROTIME-INR     Status: None   Collection Time    03/17/13  5:22 AM      Result Value Range   Prothrombin Time 14.2  11.6 - 15.2 seconds   INR 1.12  0.00 - 1.49  APTT     Status: Abnormal   Collection Time    03/17/13  5:22 AM      Result Value Range   aPTT 43 (*) 24 - 37 seconds   Comment:            IF BASELINE aPTT IS ELEVATED,     SUGGEST PATIENT RISK ASSESSMENT     BE USED TO DETERMINE APPROPRIATE     ANTICOAGULANT THERAPY.  LIPID PANEL     Status: None   Collection Time    03/17/13  5:22 AM      Result Value Range   Cholesterol 131  0 - 200 mg/dL   Triglycerides 89  <409 mg/dL   HDL 48  >81 mg/dL   Total CHOL/HDL Ratio 2.7     VLDL 18  0 - 40 mg/dL   LDL Cholesterol 65  0 - 99 mg/dL   Comment:            Total Cholesterol/HDL:CHD Risk     Coronary Heart Disease Risk Table                         Men   Women      1/2 Average Risk   3.4   3.3      Average Risk       5.0   4.4      2 X  Average Risk   9.6   7.1      3 X Average Risk  23.4   11.0                Use the calculated Patient Ratio     above and the CHD Risk Table     to determine the patient's CHD Risk.                ATP III CLASSIFICATION (LDL):      <100     mg/dL   Optimal      191-478  mg/dL   Near or Above                        Optimal      130-159  mg/dL   Borderline      295-621  mg/dL   High      >308     mg/dL   Very High     Performed at [facility]    Dg Chest Portable 1 View  03/16/2013   CLINICAL DATA:  New onset atrial fibrillation, history hypertension, hyperlipidemia, lung cancer, former smoker, initial encounter  EXAM: PORTABLE CHEST - 1 VIEW  COMPARISON:  Portable exam 0853 hr compared to 03/03/2013  FINDINGS: Chronic elevation left diaphragm.  Borderline enlargement of cardiac silhouette.  Calcified tortuous aorta.  Pulmonary vascularity normal.  Chronic accentuation of interstitial markings in the mid to lower lungs similar to previous exam.  No definite acute infiltrate, pleural effusion or pneumothorax.  Question minimal chronic left basilar atelectasis.  Bones demineralized.  IMPRESSION: Suspect minimal chronic left basilar atelectasis with chronic elevation of left diaphragm.  No acute abnormalities.   Electronically Signed   By: Ulyses Southward M.D.   On: 03/16/2013 09:06    Positive for weakness and recent dizziness secondary to hypotension secondary to dehydration and paroxysmal atrial fibrillation. Blood pressure 135/77, pulse 93, temperature 98.8 F (37.1 C), temperature source Oral, resp. rate 20, height 5\' 8"  (1.727 m), weight 165 lb 6.4 oz (75.025 kg), SpO2 98.00%. The patient appears to be in no distress.  Nasal oxygen in place. Head and neck exam reveals that the pupils are equal and reactive.  The extraocular movements are full.  There is no scleral icterus.  Mouth and pharynx are benign.  No lymphadenopathy.  No carotid bruits.  The jugular venous pressure is normal.   Thyroid is not enlarged or tender.  Chest reveals decreased breath sounds at the bases left greater than right  Heart reveals soft systolic flow murmur across the left ventricular outflow tract.  This may be secondary to his severe anemia.  No pericardial rub.  No gallop.  The abdomen is soft and nontender.  Bowel sounds are normoactive.  There is no hepatosplenomegaly or mass.  There are no abdominal bruits.  Extremities reveal no phlebitis or edema.  Pedal pulses are good.  There is no cyanosis or clubbing.  Neurologic exam is normal strength and no lateralizing weakness.  No sensory deficits.  Integument reveals no rash EKG 03/16/13 shows normal sinus rhythm and no ischemic changes.  Assessment/Plan: 1. paroxysmal atrial fibrillation, resolved 2. metastatic non-small cell lung cancer 3. stable small pericardial effusion without evidence of tamponade 4. pancytopenia secondary to #2 and to chemotherapy  Recommendation: In view of his pancytopenia I would not suggest adding anticoagulation. In view of his history of recent low blood pressures I would not send him home on Cardizem CD.  Rather I would suggest that he have some 30 mg diltiazem tablets on hand to use when necessary if his heart rate becomes irregular with a rate greater than 120. Cassell Clement 03/17/2013, 10:07 AM

## 2013-03-17 NOTE — Progress Notes (Signed)
MD updated via phone regarding Pt's BP 168/110 pulse 92. No other acute changes noted in Pt's assessment at this time. New orders received via phone.

## 2013-03-17 NOTE — Evaluation (Signed)
Physical Therapy Evaluation Patient Details Name: Tyler [name] MRN: [id number] DOB: 24-Oct-1932 Today's Date: 03/17/2013 Time: 4098-1191 PT Time Calculation (min): 27 min  PT Assessment / Plan / Recommendation History of Present Illness  Pt is a 79 year old male with a history of metastatic non-small cell lung cancer was admitted yesterday because of weakness and dizziness.  On presentation to the emergency room he was hypotensive and was given a bolus of saline with subsequent improvement in his blood pressure.  His heart rate which had been 135 in atrial fibrillation also at that point converted back to normal sinus rhythm.  The patient has a past history of hypertension and had been on diltiazem which recently had been reduced because of low blood pressure.  The patient has been undergoing chemotherapy for his lung cancer.  Clinical Impression  Pt admitted with new onset of a-fib. Pt currently with functional limitations due to the deficits listed below (see PT Problem List). Pt very motivated to ambulate today and was able to ambulate 550' with IV pole, as he would like to go home.  Pt reports he has a Systems analyst that comes to the home 3x/week, but pt's daughter and significant other requested chest PT (percussions) and potential OPPT in order to improve strength and endurance.  PT encouraged pt and pt's family to ask oncologist about chest percussions.  Pt will benefit from skilled PT to increase their independence and safety with mobility to allow discharge.    PT Assessment  Patient needs continued PT services    Follow Up Recommendations  Outpatient PT (for possible chest percussion and strength training)    Does the patient have the potential to tolerate intense rehabilitation      Barriers to Discharge        Equipment Recommendations  None recommended by PT    Recommendations for Other Services     Frequency Min 3X/week    Precautions / Restrictions  Precautions Precautions: Fall Restrictions Weight Bearing Restrictions: No   Pertinent Vitals/Pain No c/o pain during session. Pt experienced SOB during amb. O2 sat on room air during amb: 90-97%. Pt took standing rest breaks to decrease SOB but pt did not wish to take seated rest break. SOB improved after rest breaks and upon returning to room.      Mobility  Bed Mobility Bed Mobility: Not assessed Details for Bed Mobility Assistance: Pt in chair upon arrival. Transfers Transfers: Sit to Stand;Stand to Sit Sit to Stand: 5: Supervision;With armrests;From chair/3-in-1;From toilet Stand to Sit: To chair/3-in-1;To toilet Details for Transfer Assistance: Supervision to ensure safety. Ambulation/Gait Ambulation/Gait Assistance: 4: Min guard Ambulation Distance (Feet): 550 Feet Assistive device: Other (Comment) (IV pole) Ambulation/Gait Assistance Details: Min guard to ensure safety as pt reports SOB due to lung cancer. Pt amb. 150' with IV pole and min guard, pt needed restroom break to urinate, he was able to stand ind and perform hand hygiene without assist. Pt then amb. another 400' with IV pole and min guard for safety, with standing rest breaks due to SOB episodes. Gait Pattern: Step-through pattern;Narrow base of support;Decreased stride length Gait velocity: Decreased General Gait Details: Pt experienced narrow BOS at times, with no episodes of LOB. Stairs: No    Exercises     PT Diagnosis: Difficulty walking;Other (comment) (decreased endurance)  PT Problem List: Decreased strength;Decreased activity tolerance;Decreased mobility PT Treatment Interventions: DME instruction;Gait training;Stair training;Therapeutic activities;Functional mobility training;Therapeutic exercise;Patient/family education     PT Goals(Current goals can be found in  the care plan section) Acute Rehab PT Goals Patient Stated Goal: to go home PT Goal Formulation: With patient Time For Goal Achievement:  03/24/13 Potential to Achieve Goals: Good  Visit Information  Last PT Received On: 03/17/13 Assistance Needed: +1 History of Present Illness: Pt is a 79 year old male with a history of metastatic non-small cell lung cancer was admitted yesterday because of weakness and dizziness.  On presentation to the emergency room he was hypotensive and was given a bolus of saline with subsequent improvement in his blood pressure.  His heart rate which had been 135 in atrial fibrillation also at that point converted back to normal sinus rhythm.  The patient has a past history of hypertension and had been on diltiazem which recently had been reduced because of low blood pressure.  The patient has been undergoing chemotherapy for his lung cancer.       Prior Functioning  Home Living Family/patient expects to be discharged to:: Private residence Living Arrangements: Spouse/significant other Available Help at Discharge: Family Type of Home: House Home Access: Stairs to enter Secretary/administrator of Steps: 2 Entrance Stairs-Rails: None Home Layout: One level Home Equipment: Cane - single point Prior Function Level of Independence: Independent Comments: Pt and family reports he did not use and AD to amb. Communication Communication: No difficulties Dominant Hand: Right    Cognition  Cognition Arousal/Alertness: Awake/alert Behavior During Therapy: WFL for tasks assessed/performed Overall Cognitive Status: Within Functional Limits for tasks assessed    Extremity/Trunk Assessment Upper Extremity Assessment Upper Extremity Assessment: Overall WFL for tasks assessed;Generalized weakness Lower Extremity Assessment Lower Extremity Assessment: Defer to PT evaluation Cervical / Trunk Assessment Cervical / Trunk Assessment: Normal   Balance Balance Balance Assessed: Yes Static Standing Balance Static Standing - Balance Support: No upper extremity supported Static Standing - Level of Assistance: 6:  Modified independent (Device/Increase time) Static Standing - Comment/# of Minutes: Pt able to stand at toilet in order to urinate and perform hand hygiene ind. with increased time required. Dynamic Standing Balance Dynamic Standing - Balance Support: No upper extremity supported;During functional activity Dynamic Standing - Level of Assistance: 5: Stand by assistance  End of Session PT - End of Session Equipment Utilized During Treatment: Gait belt Activity Tolerance: Patient tolerated treatment well Patient left: in chair;with call bell/phone within reach;with nursing/sitter in room;with family/visitor present  GP     Sol Blazing 03/17/2013, 1:02 PM

## 2013-03-17 NOTE — Progress Notes (Signed)
Patient and family given discharge instructions, pt verbalized understanding. Patient discharged and assisted via wheelchair by Nurse tech to home. Tyler [name]

## 2013-03-17 NOTE — Evaluation (Signed)
I have reviewed this note and agree with all findings. Kati Lacie Landry, PT, DPT Pager: [phone]   

## 2013-03-17 NOTE — Discharge Summary (Signed)
Physician Discharge Summary  Tyler [name] ZOX:[id number] DOB: 31-Jul-1932 DOA: 03/16/2013  PCP: Tyler [name], D, MD  Admit date: 03/16/2013 Discharge date: 03/17/2013  Time spent: 65 minutes  Recommendations for Outpatient Follow-up:  1. Patient is to followup with Dr. Shirline [name] one week post discharge. On followup CBC will need to be checked to followup on patient's counts. A basic metabolic profile will also need to be checked to followup on his electrolytes and renal function. 2. Patient is to follow up at oncology office on Thursday as scheduled for lab work. 3. Patient is to followup with Dr. Elease [name] of cardiology in 2 weeks to followup on his paroxysmal atrial fibrillation.  Discharge Diagnoses:  Principal Problem:   New onset a-fib Active Problems:   HYPERLIPIDEMIA   HYPERTENSION   BENIGN PROSTATIC HYPERTROPHY   COPD, moderate   Non-small cell carcinoma of lung   Hypotension   Dehydration   Hyponatremia   Pancytopenia   Discharge Condition: stable  Diet recommendation: regular  Filed Weights   03/16/13 1200 03/17/13 0542  Weight: 74 kg (163 lb 2.3 oz) 75.025 kg (165 lb 6.4 oz)    History of present illness:  Tyler [name] is a 79 y.o. male  Patient is a pleasant gentleman with history of hypertension on Cardizem, BPH, metastatic non-small cell lung cancer diagnosed in July of 2014 status post palliative radiotherapy to enlarged mediastinal and supraclavicular lymph nodes per Tyler [name] currently on systemic chemotherapy with carboplatin to AUC of 5 and Altima 500 mg/M2 q. 3 weeks who is status post cycle #3 on 03/05/2013 who presents to the ED with dizziness and generalized weakness. Patient denies any syncope, no chest pain, no change in his chronic shortness of breath, no fever, no chills, no nausea, no vomiting, no palpitations, no abdominal pain, no diarrhea, no constipation. Patient does endorse generalized weakness and some decreased oral intake over the past few weeks  that have been slowly improving.  Patient states was on Cardizem 240 mg daily for his blood pressure however after significant weight loss of about 30 pounds in the drop of his blood and his blood pressure is Cardizem dose was decreased to 120 mg daily. Patient stated that he followup up with a new PCP Dr. Shirlean [name] recently and due to his borderline blood pressure issues he was told not to take any of his Cardizem if his systolic blood pressure was less than 150. Due to that patient has only taken one dose of his Cardizem since then. Patient also endorses that his pulse been in the high 90s to the low 100s recently.  Patient was seen in the emergency room and on presentation his systolic blood pressure was 74/61 and his heart rate was 135 and noted to be in atrial fibrillation. Patient was bolused with IV fluids with subsequent improvement in his blood pressure. Patient also spontaneously converted back to normal sinus rhythm with a heart rate in the 80s.  Labs obtained in the ED basic metabolic profile the sodium of 128 chloride of 92 BUN of 24 otherwise was within normal limits. First set of troponin was negative. Lactic acid level was 1.10. CBC had a white count of 2.1 hemoglobin of 9.4 and a platelet count of 52.  Chest x-ray obtained was negative. Urinalysis was negative. We were consulted to admit the patient for further evaluation and management.  Per ED physician cardiology has been consulted.   Hospital Course:  #1 new onset paroxysmal atrial fibrillation Patient was admitted  with new onset paroxysmal atrial fibrillation. On presentation to the emergency room patient was hypotensive and heart rate noted to be 135 in atrial fibrillation. Patient was given a bolus of IV fluids with subsequent improvement in his blood pressure. Patient's heart rate subsequently called her back to normal sinus rhythm. Patient had been on diltiazem prior to admission however dose had recently been reduced secondary  to low blood pressure. Patient was placed on telemetry. Cardiac enzymes were cycled which were negative x3. 2-D echo showed an EF of 50-55%, with moderate thickening calcification of the aortic valve with valve area of 1.32 cm, a small free-flowing pericardial effusion circumferential to the heart which was negative for hemodynamic compromise as well as a mild right atrial chamber collapse. TSH was noted to be 0.819. Patient's Cardizem was held. It was hydrated with IV fluids and follow. Patient did not have any further episodes of atrial fibrillation. Was recommended per cardiology that patient be placed on diltiazem 30 mg as needed for irregular heartbeat and a heart rate in the 120. It was felt that due to patient's pancytopenia he was not a candidate for anti-coagulation. Patient will be discharged in stable and improved condition on diltiazem 30 mg as needed for heart rate greater than 120 and is to followup with Dr. Elease [name] as outpatient. Patient be discharged in stable and improved condition.  #2 pancytopenia Patient was admitted with a pancytopenia which was felt to be secondary to recent chemotherapy therapy. Patient did not have any overt bleeding. Patient remained afebrile. Chest x-ray which was obtained on admission was negative for any acute infiltrate. Urinalysis which was done was negative. Patient was seen by his oncologist during the hospitalization. Patient's hemoglobin dropped to 7.9 from 9.4 on admission from 11.3 on 03/03/2013. It was recommended per oncology to transfuse patient 2 units of packed red blood cells. Patient was transfused 2 units packed red blood cells. Repeat CBC post transfusion with Hgb 10.9. Patient will follow up with oncology as outpatient.  #3 dehydration Patient was hydrated with IV fluids. #4 hyponatremia On admission patient was noted to be hyponatremic was felt this was secondary to hypovolemia as patient was hypotensive on admission. TSH was obtained was within  normal limits. Chest x-ray no acute infiltrate. Patient was hydrated with IV fluids with improvement in his hyponatremia.  #4 hypotension On admission patient was noted to be hypotensive with systolic blood pressure in the 70s but responded with IV fluids. Was felt patient's hypotension was secondary to decreased oral intake. Patient had no signs of infection. Cardiac enzymes which was cycled were negative x3. Patient's blood pressure responded to hydration. And his hypotension had resolved by day of discharge.  #5 metastatic non-small cell lung cancer Remained stable during the hospitalization. Patient will followup with oncology as an outpatient.  Procedures:  2 D echo 03/16/13  2 u nits PRBCs 03/17/13  Consultations:  Cardiology: Dr. Patty Sermons 03/17/13  Oncology: Dr Tyler [name] 03/17/13  Discharge Exam: Filed Vitals:   03/17/13 2002  BP: 115/79  Pulse: 83  Temp: 98.2 F (36.8 C)  Resp: 20    General: NAD Cardiovascular: RRR Respiratory: CTAB  Discharge Instructions      Discharge Orders   Future Appointments Provider Department Dept Phone   03/19/2013 11:00 AM Krista Blue [facility] MEDICAL ONCOLOGY [phone]   03/24/2013 12:30 PM [facility] [phone]   Patient to arrive 15 minutes prior to appointment time.   03/26/2013 10:30  AM [facility]ell [facility] MEDICAL ONCOLOGY [phone]   03/26/2013 11:00 AM Si Gaul, MD [facility] MEDICAL ONCOLOGY [phone]   03/26/2013 11:45 AM [facility] [phone]   03/26/2013 1:45 PM Anabel Bene, RD [facility] MEDICAL ONCOLOGY [phone]   04/02/2013 11:00 AM [facility] MEDICAL ONCOL[facility] [phone]   04/09/2013 10:30 AM Delcie Roch [facility] MEDICAL ONCOLOGY [phone]   Future Orders Complete By Expires   Diet  general  As directed    Discharge instructions  As directed    Comments:     Follow up with Dr Tyler [name] in 1 week. Follow up with Dr Tyler [name], cardiology in 2 weeks.   Increase activity slowly  As directed        Medication List    STOP taking these medications       diltiazem 120 MG 24 hr capsule  Commonly known as:  CARDIZEM CD      TAKE these medications       cholecalciferol 1000 UNITS tablet  Commonly known as:  VITAMIN D  Take 1,000 Units by mouth every morning.     citalopram 10 MG tablet  Commonly known as:  CELEXA  Take 10 mg by mouth daily as needed (depression).     dexamethasone 4 MG tablet  Commonly known as:  DECADRON  Take 4 mg by mouth 2 (two) times daily with a meal. Take while on Chemo     diltiazem 30 MG tablet  Commonly known as:  CARDIZEM  Take 1 tablet (30 mg total) by mouth 4 (four) times daily as needed (for irregular heart rate  greater than 120.).     feeding supplement Liqd  Take 1 Container by mouth daily.     folic acid 1 MG tablet  Commonly known as:  FOLVITE  Take 1 mg by mouth every morning.     guaiFENesin-codeine 100-10 MG/5ML syrup  Commonly known as:  ROBITUSSIN AC  Take 10 mLs by mouth 3 (three) times daily as needed for cough. Take 1-2 tsp up to every 6 hours if needed for cough.     multivitamin-iron-minerals-folic acid chewable tablet  Chew 1 tablet by mouth every morning.     omeprazole 20 MG capsule  Commonly known as:  PRILOSEC  Take 20 mg by mouth daily before breakfast.     prochlorperazine 10 MG tablet  Commonly known as:  COMPAZINE  Take 10 mg by mouth every 6 (six) hours as needed (nausea).     senna 8.6 MG tablet  Commonly known as:  SENOKOT  Take 1 tablet by mouth daily as needed for constipation.     traMADol 50 MG tablet  Commonly known as:  ULTRAM  Take 1 tablet (50 mg total) by mouth every 6 (six) hours as needed.      ASK your doctor about these medications       docusate sodium 250 MG capsule   Commonly known as:  COLACE  Take 1 capsule (250 mg total) by mouth daily.       Allergies  Allergen Reactions  . Vicodin [Hydrocodone-Acetaminophen] Other (See Comments)    hallucinations   Follow-up Information   Follow up with [facility] K., MD. Schedule an appointment as soon as possible for a visit in 1 week.   Specialty:  Oncology   Contact information:   [address] [place]  [place] [address] [phone]       Follow up with Elyn Aquas., MD. Schedule an appointment as soon as possible for a visit in 2 weeks.   Specialty:  Cardiology   Contact information:   [address] [place] [address] [phone]        The results of significant diagnostics from this hospitalization (including imaging, microbiology, ancillary and laboratory) are listed below for reference.    Significant Diagnostic Studies: Dg Chest Portable 1 View  03/16/2013   CLINICAL DATA:  New onset atrial fibrillation, history hypertension, hyperlipidemia, lung cancer, former smoker, initial encounter  EXAM: PORTABLE CHEST - 1 VIEW  COMPARISON:  Portable exam 0853 hr compared to 03/03/2013  FINDINGS: Chronic elevation left diaphragm.  Borderline enlargement of cardiac silhouette.  Calcified tortuous aorta.  Pulmonary vascularity normal.  Chronic accentuation of interstitial markings in the mid to lower lungs similar to previous exam.  No definite acute infiltrate, pleural effusion or pneumothorax.  Question minimal chronic left basilar atelectasis.  Bones demineralized.  IMPRESSION: Suspect minimal chronic left basilar atelectasis with chronic elevation of left diaphragm.  No acute abnormalities.   Electronically Signed   By: Ulyses Southward M.D.   On: 03/16/2013 09:06   Dg Chest Port 1 View  03/03/2013   CLINICAL DATA:  79 year old male dizziness. History of lung cancer.  EXAM: PORTABLE CHEST - 1 VIEW  COMPARISON:  01/25/2013 and earlier.  FINDINGS: Portable AP semi upright view at  0807 hrs. Decreased left lung volume. No significant change in increased left suprahilar density and spiculations extending into the left apex. No pneumothorax or pulmonary edema. No pleural effusion. Stable cardiac size and mediastinal contours. Visualized tracheal air column is within normal limits.  IMPRESSION: Decreased left lung volume. This might be treatment related if the patient has undergone XRT for the left lung mass. No superimposed pneumothorax, edema, or pleural effusion.   Electronically Signed   By: Augusto Gamble M.D.   On: 03/03/2013 08:29    Microbiology: Recent Results (from the past 240 hour(s))  CULTURE, BLOOD (ROUTINE X 2)     Status: None   Collection Time    03/16/13  9:00 AM      Result Value Range Status   Specimen Description BLOOD LEFT ANTECUBITAL   Final   Special Requests BOTTLES DRAWN AEROBIC AND ANAEROBIC 5CC   Final   Culture  Setup Time     Final   Value: 03/16/2013 13:35     Performed at [facility]   Culture     Final   Value:        BLOOD CULTURE RECEIVED NO GROWTH TO DATE CULTURE WILL BE HELD FOR 5 DAYS BEFORE ISSUING A FINAL NEGATIVE REPORT     Performed at [facility]   Report Status PENDING   Incomplete  URINE CULTURE     Status: None   Collection Time    03/16/13  9:28 AM      Result Value Range Status   Specimen Description URINE, CLEAN CATCH   Final   Special Requests NONE   Final   Culture  Setup Time     Final   Value: 03/16/2013 14:18     Performed at [facility] Count     Final   Value: NO GROWTH     Performed at [facility]   Culture     Final   Value: NO GROWTH  Performed at [facility]   Report Status 03/17/2013 FINAL   Final  CULTURE, BLOOD (ROUTINE X 2)     Status: None   Collection Time    03/16/13 10:10 AM      Result Value Range Status   Specimen Description BLOOD LEFT FOREARM   Final   Special Requests BOTTLES DRAWN AEROBIC AND ANAEROBIC   Final   Culture  Setup  Time     Final   Value: 03/16/2013 13:35     Performed at [facility]   Culture     Final   Value:        BLOOD CULTURE RECEIVED NO GROWTH TO DATE CULTURE WILL BE HELD FOR 5 DAYS BEFORE ISSUING A FINAL NEGATIVE REPORT     Performed at [facility]   Report Status PENDING   Incomplete     Labs: Basic Metabolic Panel:  Recent Labs Lab 03/12/13 1519 03/16/13 0830 03/17/13 0522  NA 132* 128* 130*  K 4.7 4.2 3.9  CL  --  92* 99  CO2 25 26 24   GLUCOSE 115 119* 99  BUN 27.3* 24* 14  CREATININE 1.2 1.22 0.98  CALCIUM 8.7 8.6 8.1*  MG  --  1.5  --   PHOS  --  3.6  --    Liver Function Tests:  Recent Labs Lab 03/12/13 1519  AST 21  ALT 27  ALKPHOS 55  BILITOT 0.92  PROT 6.0*  ALBUMIN 2.9*   No results found for this basename: LIPASE, AMYLASE,  in the last 168 hours No results found for this basename: AMMONIA,  in the last 168 hours CBC:  Recent Labs Lab 03/12/13 1519 03/16/13 0830 03/17/13 0522 03/17/13 2000  WBC 2.4* 2.1* 2.1* 3.0*  NEUTROABS 1.8  --   --  1.9  HGB 10.7* 9.4* 7.9* 10.9*  HCT 31.2* 26.9* 22.8* 30.9*  MCV 87.1 84.6 84.4 83.7  PLT 140 52* 39* 40*   Cardiac Enzymes:  Recent Labs Lab 03/16/13 0830 03/16/13 1422 03/16/13 2021 03/17/13 0522  TROPONINI <0.30 <0.30 <0.30 <0.30   BNP: BNP (last 3 results) No results found for this basename: PROBNP,  in the last 8760 hours CBG:  Recent Labs Lab 03/16/13 0850  GLUCAP 117*       Signed:  Haleem Hanner  [facility] 03/17/2013, 9:16 PM

## 2013-03-17 NOTE — Progress Notes (Signed)
INITIAL NUTRITION ASSESSMENT  DOCUMENTATION CODES Per approved criteria  -Severe malnutrition in the context of chronic illness   INTERVENTION: Recommend diet liberalization to Regular to maximize PO choices. Add Resource Breeze po daily, each supplement provides 250 kcal and 9 grams of protein. Encouraged utilization of Oncology RD when d/c. RD to continue to follow nutrition care plan.  NUTRITION DIAGNOSIS: Increased nutrient needs related to catabolic illness as evidenced by estimated needs.   Goal: Intake to meet >90% of estimated nutrition needs.  Monitor:  weight trends, lab trends, I/O's, PO intake, supplement tolerance  Reason for Assessment: Malnutrition Screening Tool  79 y.o. male  Admitting Dx: New onset a-fib  ASSESSMENT: PMHx significant for HTN and metastatic non-small cell lung cancer diagnosed in July of 2014 status post palliative radiotherapy, currently on systemic chemotherapy. Admitted with dizziness and generalized weakness. Work-up reveals new A fib.  RD drawn to chart 2/2 Malnutrition Screening Tool. Pt is currently being followed by Oncology RD, Zenovia Jarred. Pt with ongoing weight loss and variable intake; reviewed RD notes. Discussed oral nutrition supplements and high calorie foods. Wife reports that she is frustrated with patient's Heart Healthy diet. Encouraged wife to resume Regular diet when d/c. Provided sample of Resource Breeze for patient to try while admitted. Family is anxiously awaiting arrival of cardiology; ready to d/c home.  Pt meets criteria for severe MALNUTRITION in the context of chronic illness as evidenced by 12% wt loss x 2 months and intake of <75% x at least 1 month.  Height: Ht Readings from Last 1 Encounters:  03/16/13 5\' 8"  (1.727 m)    Weight: Wt Readings from Last 1 Encounters:  03/17/13 165 lb 6.4 oz (75.025 kg)    Ideal Body Weight: 154 lb  % Ideal Body Weight: 107%  Wt Readings from Last 10 Encounters:   03/17/13 165 lb 6.4 oz (75.025 kg)  03/05/13 166 lb 8 oz (75.524 kg)  02/26/13 167 lb 11.2 oz (76.068 kg)  02/24/13 169 lb (76.658 kg)  02/12/13 171 lb 14.4 oz (77.973 kg)  02/02/13 177 lb (80.287 kg)  01/25/13 196 lb (88.905 kg)  01/23/13 183 lb 9.6 oz (83.28 kg)  01/20/13 183 lb 1.6 oz (83.054 kg)  01/19/13 186 lb 3.2 oz (84.46 kg)    Usual Body Weight: 188 lb  % Usual Body Weight: 88%  BMI:  Body mass index is 25.15 kg/(m^2). WNL  Estimated Nutritional Needs: Kcal: 2000 - 2300 Protein: 90 - 110 g  Fluid: 2 liters daily  Skin: intact  Diet Order: Cardiac  EDUCATION NEEDS: -No education needs identified at this time   Intake/Output Summary (Last 24 hours) at 03/17/13 0934 Last data filed at 03/17/13 0700  Gross per 24 hour  Intake 3323.34 ml  Output   2325 ml  Net 998.34 ml    Last BM: 9/28  Labs:   Recent Labs Lab 03/12/13 1519 03/16/13 0830 03/17/13 0522  NA 132* 128* 130*  K 4.7 4.2 3.9  CL  --  92* 99  CO2 25 26 24   BUN 27.3* 24* 14  CREATININE 1.2 1.22 0.98  CALCIUM 8.7 8.6 8.1*  MG  --  1.5  --   PHOS  --  3.6  --   GLUCOSE 115 119* 99    CBG (last 3)   Recent Labs  03/16/13 0850  GLUCAP 117*    Scheduled Meds: . docusate sodium  250 mg Oral Daily  . folic acid  1 mg Oral q  morning - 10a  . pantoprazole  40 mg Oral Daily  . sodium chloride  3 mL Intravenous Q12H    Continuous Infusions: . sodium chloride Stopped (03/16/13 1047)  . sodium chloride 125 mL/hr at 03/17/13 1478    Past Medical History  Diagnosis Date  . BENIGN PROSTATIC HYPERTROPHY 07/23/2008  . 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 HIP PAIN, LEFT 07/04/2007  . HYPERLIPIDEMIA 01/02/2007  . HYPERTENSION 01/02/2007  . INSOMNIA 11/16/2008  . PROSTATE SPECIFIC ANTIGEN, ELEVATED 09/10/2008  . Lung cancer     metastatic non small cell lung cancer, adenocarcinoma     Past Surgical History  Procedure Laterality Date  . Transurethral resection of prostate  2004   . Total hip arthroplasty  2010, 2011    2010- left; 2011- right  . Upper gastrointestinal endoscopy      Jarold Motto MS, RD, LDN Pager: [phone] After-hours pager: [phone]

## 2013-03-18 LAB — TYPE AND SCREEN
ABO/RH(D): O NEG
Antibody Screen: NEGATIVE
Unit division: 0
Unit division: 0

## 2013-03-19 ENCOUNTER — Other Ambulatory Visit (HOSPITAL_BASED_OUTPATIENT_CLINIC_OR_DEPARTMENT_OTHER): Payer: Medicare Other

## 2013-03-19 DIAGNOSIS — C3410 Malignant neoplasm of upper lobe, unspecified bronchus or lung: Secondary | ICD-10-CM

## 2013-03-19 DIAGNOSIS — C3490 Malignant neoplasm of unspecified part of unspecified bronchus or lung: Secondary | ICD-10-CM

## 2013-03-19 LAB — CBC WITH DIFFERENTIAL/PLATELET
BASO%: 0.3 % (ref 0.0–2.0)
Basophils Absolute: 0 10*3/uL (ref 0.0–0.1)
HCT: 31.7 % — ABNORMAL LOW (ref 38.4–49.9)
HGB: 10.9 g/dL — ABNORMAL LOW (ref 13.0–17.1)
LYMPH%: 12.5 % — ABNORMAL LOW (ref 14.0–49.0)
MCHC: 34.5 g/dL (ref 32.0–36.0)
MCV: 85.1 fL (ref 79.3–98.0)
MONO#: 0.3 10*3/uL (ref 0.1–0.9)
NEUT%: 76.4 % — ABNORMAL HIGH (ref 39.0–75.0)
Platelets: 52 10*3/uL — ABNORMAL LOW (ref 140–400)
WBC: 3.3 10*3/uL — ABNORMAL LOW (ref 4.0–10.3)
lymph#: 0.4 10*3/uL — ABNORMAL LOW (ref 0.9–3.3)

## 2013-03-19 LAB — COMPREHENSIVE METABOLIC PANEL (CC13)
ALT: 21 U/L (ref 0–55)
AST: 18 U/L (ref 5–34)
BUN: 14.9 mg/dL (ref 7.0–26.0)
CO2: 26 mEq/L (ref 22–29)
Calcium: 9 mg/dL (ref 8.4–10.4)
Chloride: 96 mEq/L — ABNORMAL LOW (ref 98–109)
Creatinine: 1.3 mg/dL (ref 0.7–1.3)
Glucose: 146 mg/dl — ABNORMAL HIGH (ref 70–140)
Total Bilirubin: 0.81 mg/dL (ref 0.20–1.20)

## 2013-03-22 LAB — CULTURE, BLOOD (ROUTINE X 2): Culture: NO GROWTH

## 2013-03-24 ENCOUNTER — Ambulatory Visit (HOSPITAL_COMMUNITY)
Admission: RE | Admit: 2013-03-24 | Discharge: 2013-03-24 | Disposition: A | Discharge status: Home / Self Care | Payer: Medicare Other | Source: Ambulatory Visit | Attending: Internal Medicine | Admitting: Internal Medicine

## 2013-03-24 ENCOUNTER — Other Ambulatory Visit: Payer: Self-pay | Admitting: Internal Medicine

## 2013-03-24 ENCOUNTER — Telehealth: Payer: Self-pay | Admitting: *Deleted

## 2013-03-24 ENCOUNTER — Telehealth: Payer: Self-pay | Admitting: Internal Medicine

## 2013-03-24 DIAGNOSIS — M5137 Other intervertebral disc degeneration, lumbosacral region: Secondary | ICD-10-CM | POA: Insufficient documentation

## 2013-03-24 DIAGNOSIS — Z79899 Other long term (current) drug therapy: Secondary | ICD-10-CM | POA: Insufficient documentation

## 2013-03-24 DIAGNOSIS — Z923 Personal history of irradiation: Secondary | ICD-10-CM | POA: Insufficient documentation

## 2013-03-24 DIAGNOSIS — I714 Abdominal aortic aneurysm, without rupture, unspecified: Secondary | ICD-10-CM | POA: Insufficient documentation

## 2013-03-24 DIAGNOSIS — C3492 Malignant neoplasm of unspecified part of left bronchus or lung: Secondary | ICD-10-CM

## 2013-03-24 DIAGNOSIS — C3410 Malignant neoplasm of upper lobe, unspecified bronchus or lung: Secondary | ICD-10-CM | POA: Insufficient documentation

## 2013-03-24 DIAGNOSIS — I2510 Atherosclerotic heart disease of native coronary artery without angina pectoris: Secondary | ICD-10-CM | POA: Insufficient documentation

## 2013-03-24 DIAGNOSIS — J3801 Paralysis of vocal cords and larynx, unilateral: Secondary | ICD-10-CM | POA: Insufficient documentation

## 2013-03-24 DIAGNOSIS — I7409 Other arterial embolism and thrombosis of abdominal aorta: Secondary | ICD-10-CM | POA: Insufficient documentation

## 2013-03-24 DIAGNOSIS — C3490 Malignant neoplasm of unspecified part of unspecified bronchus or lung: Secondary | ICD-10-CM

## 2013-03-24 DIAGNOSIS — J986 Disorders of diaphragm: Secondary | ICD-10-CM | POA: Insufficient documentation

## 2013-03-24 DIAGNOSIS — M47817 Spondylosis without myelopathy or radiculopathy, lumbosacral region: Secondary | ICD-10-CM | POA: Insufficient documentation

## 2013-03-24 DIAGNOSIS — I319 Disease of pericardium, unspecified: Secondary | ICD-10-CM | POA: Insufficient documentation

## 2013-03-24 DIAGNOSIS — N281 Cyst of kidney, acquired: Secondary | ICD-10-CM | POA: Insufficient documentation

## 2013-03-24 DIAGNOSIS — M51379 Other intervertebral disc degeneration, lumbosacral region without mention of lumbar back pain or lower extremity pain: Secondary | ICD-10-CM | POA: Insufficient documentation

## 2013-03-24 DIAGNOSIS — J438 Other emphysema: Secondary | ICD-10-CM | POA: Insufficient documentation

## 2013-03-24 DIAGNOSIS — C770 Secondary and unspecified malignant neoplasm of lymph nodes of head, face and neck: Secondary | ICD-10-CM | POA: Insufficient documentation

## 2013-03-24 DIAGNOSIS — K4020 Bilateral inguinal hernia, without obstruction or gangrene, not specified as recurrent: Secondary | ICD-10-CM | POA: Insufficient documentation

## 2013-03-24 DIAGNOSIS — R918 Other nonspecific abnormal finding of lung field: Secondary | ICD-10-CM | POA: Insufficient documentation

## 2013-03-24 MED ORDER — IOHEXOL 300 MG/ML  SOLN
100.0000 mL | Freq: Once | INTRAMUSCULAR | Status: AC | PRN
  Administered 2013-03-24: 100 mL via INTRAVENOUS

## 2013-03-24 NOTE — Telephone Encounter (Signed)
THE REPORT WAS CALLED AND FAXED. THIS REPORT WAS GIVEN TO DR.MOHAMED. 

## 2013-03-24 NOTE — Telephone Encounter (Signed)
I called Mr. Crehan today at home and informed him about the results of the CT scan including the increase in the size of the infrarenal abdominal aortic aneurysm with a possible subacute component of the mural thrombus. The patient indicated that this is news to him and he did not know that he has abdominal aortic aneurysm before.  I explained to the patient that I will make an urgent consultation with vascular surgery to evaluate his condition. He has a routine followup visit with me in 2 days for a more detailed discussion of his scan results regarding his response to the treatment. He was advised to call immediately or go to the emergency room if he has any concerning symptoms.

## 2013-03-25 ENCOUNTER — Other Ambulatory Visit: Payer: Self-pay | Admitting: *Deleted

## 2013-03-25 ENCOUNTER — Other Ambulatory Visit: Payer: Self-pay | Admitting: Pulmonary Disease

## 2013-03-25 ENCOUNTER — Telehealth: Payer: Self-pay | Admitting: Internal Medicine

## 2013-03-25 NOTE — Telephone Encounter (Signed)
SW Triage Nurse Okey Regal at [facility] who will cb to [place] w STAT appt time  Judeth Cornfield Desk Nurse is aware  shh

## 2013-03-26 ENCOUNTER — Other Ambulatory Visit: Payer: Medicare Other | Admitting: Lab

## 2013-03-26 ENCOUNTER — Encounter: Payer: Medicare Other | Admitting: Nutrition

## 2013-03-26 ENCOUNTER — Other Ambulatory Visit (HOSPITAL_BASED_OUTPATIENT_CLINIC_OR_DEPARTMENT_OTHER): Payer: Medicare Other | Admitting: Lab

## 2013-03-26 ENCOUNTER — Telehealth: Payer: Self-pay | Admitting: Internal Medicine

## 2013-03-26 ENCOUNTER — Encounter: Payer: Self-pay | Admitting: Vascular Surgery

## 2013-03-26 ENCOUNTER — Encounter: Payer: Self-pay | Admitting: Internal Medicine

## 2013-03-26 ENCOUNTER — Ambulatory Visit: Payer: Medicare Other

## 2013-03-26 ENCOUNTER — Ambulatory Visit (HOSPITAL_BASED_OUTPATIENT_CLINIC_OR_DEPARTMENT_OTHER): Payer: Medicare Other | Admitting: Internal Medicine

## 2013-03-26 ENCOUNTER — Telehealth: Payer: Self-pay | Admitting: Pulmonary Disease

## 2013-03-26 VITALS — BP 123/86 | HR 91 | Temp 98.0°F | Resp 18 | Ht 68.0 in | Wt 162.7 lb

## 2013-03-26 DIAGNOSIS — C3410 Malignant neoplasm of upper lobe, unspecified bronchus or lung: Secondary | ICD-10-CM

## 2013-03-26 DIAGNOSIS — C3490 Malignant neoplasm of unspecified part of unspecified bronchus or lung: Secondary | ICD-10-CM

## 2013-03-26 DIAGNOSIS — D6481 Anemia due to antineoplastic chemotherapy: Secondary | ICD-10-CM

## 2013-03-26 DIAGNOSIS — C3492 Malignant neoplasm of unspecified part of left bronchus or lung: Secondary | ICD-10-CM

## 2013-03-26 LAB — COMPREHENSIVE METABOLIC PANEL (CC13)
ALT: 14 U/L (ref 0–55)
Albumin: 2.8 g/dL — ABNORMAL LOW (ref 3.5–5.0)
Anion Gap: 7 mEq/L (ref 3–11)
BUN: 13.2 mg/dL (ref 7.0–26.0)
CO2: 30 mEq/L — ABNORMAL HIGH (ref 22–29)
Calcium: 9.3 mg/dL (ref 8.4–10.4)
Chloride: 94 mEq/L — ABNORMAL LOW (ref 98–109)
Glucose: 163 mg/dl — ABNORMAL HIGH (ref 70–140)
Potassium: 4.4 mEq/L (ref 3.5–5.1)
Sodium: 130 mEq/L — ABNORMAL LOW (ref 136–145)
Total Bilirubin: 0.44 mg/dL (ref 0.20–1.20)
Total Protein: 6.4 g/dL (ref 6.4–8.3)

## 2013-03-26 LAB — CBC WITH DIFFERENTIAL/PLATELET
BASO%: 0.2 % (ref 0.0–2.0)
Basophils Absolute: 0 10*3/uL (ref 0.0–0.1)
MCHC: 34.3 g/dL (ref 32.0–36.0)
MONO#: 1 10*3/uL — ABNORMAL HIGH (ref 0.1–0.9)
NEUT#: 2.6 10*3/uL (ref 1.5–6.5)
NEUT%: 61.6 % (ref 39.0–75.0)
RBC: 3.4 10*6/uL — ABNORMAL LOW (ref 4.20–5.82)
RDW: 15.4 % — ABNORMAL HIGH (ref 11.0–14.6)
WBC: 4.2 10*3/uL (ref 4.0–10.3)
lymph#: 0.5 10*3/uL — ABNORMAL LOW (ref 0.9–3.3)

## 2013-03-26 LAB — TECHNOLOGIST REVIEW

## 2013-03-26 MED ORDER — GUAIFENESIN-CODEINE 100-10 MG/5ML PO SYRP: ORAL_SOLUTION | ORAL | Status: DC

## 2013-03-26 MED ORDER — MIRTAZAPINE 15 MG PO TABS: 15.0000 mg | ORAL_TABLET | Freq: Every day | ORAL | Status: DC

## 2013-03-26 MED ORDER — HYDROCODONE-HOMATROPINE 5-1.5 MG/5ML PO SYRP: 5.0000 mL | ORAL_SOLUTION | Freq: Four times a day (QID) | ORAL | Status: DC | PRN

## 2013-03-26 NOTE — Telephone Encounter (Signed)
gv dtr appt sch

## 2013-03-26 NOTE — Telephone Encounter (Signed)
Last visit was on 02-24-13 with Dr. Shelle Iron for an acute visit. Pt received a refill of Robitussin AC at that time. Pt is calling today for a refill. Dr. Kendrick Fries is on nights so unavailable to ask. Since last saw Dr. Shelle Iron I will forward message to him to see if ok to refill? Do I need to ask doc-of-the-day? Please advise. Tyler [name], CMA Allergies  Allergen Reactions  . Vicodin [Hydrocodone-Acetaminophen] Other (See Comments)    hallucinations

## 2013-03-26 NOTE — Telephone Encounter (Signed)
Rx was called to pharm with no refills LMTCB for the pt to discuss KC's recs  Pt has no upcoming appts and needs to f/u with BW whom is his primary pulm doc if cough is not improving

## 2013-03-26 NOTE — Telephone Encounter (Signed)
This pt really needs to see his primary pulmonologist.  Rip Harbour to fill with 4 ounces only, no refills.  If they decide he is comfort care only, I think we can be more liberal with cough suppression.

## 2013-03-26 NOTE — Telephone Encounter (Signed)
gv pt dtr appt schedule for October. Per 10/9 pof cx all chemo/lb appts until next MD visit.

## 2013-03-27 ENCOUNTER — Encounter: Payer: Self-pay | Admitting: Vascular Surgery

## 2013-03-27 ENCOUNTER — Telehealth: Payer: Self-pay | Admitting: Medical Oncology

## 2013-03-27 ENCOUNTER — Ambulatory Visit (INDEPENDENT_AMBULATORY_CARE_PROVIDER_SITE_OTHER): Payer: Medicare Other | Admitting: Vascular Surgery

## 2013-03-27 VITALS — Ht 68.0 in | Wt 162.8 lb

## 2013-03-27 DIAGNOSIS — I714 Abdominal aortic aneurysm, without rupture: Secondary | ICD-10-CM

## 2013-03-27 NOTE — Telephone Encounter (Signed)
Called, spoke with pt who put Tyler [name], companion, on the phone.  Tyler [name] states they cancelled the request for the [place] [facility] bc pt wasn't happy with it.  Tyler [name] also reports that pt is unable to make appt with BQ at this time bc they found, on Tuesday, he has an aneurysm.  He is scheduled to see Vascular Surgeon today.  Tyler [name] states pt would like to give BQ is "regards" but is just unable to schedule appt at this time.  Will route to BQ as FYI.

## 2013-03-27 NOTE — Telephone Encounter (Signed)
Request copy of CT report to be emailed. I called pt and left message that I will mail the report .

## 2013-03-27 NOTE — Progress Notes (Signed)
[facility] [phone]   [phone]  OFFICE PROGRESS NOTE  Frederich Chick, MD [address] [place] [address]  DIAGNOSIS AND STAGE: Metastatic non-small cell lung cancer, adenocarcinoma presented with left upper lobe lung mass in addition to extensive supraclavicular, mediastinal adenopathy and bilateral pulmonary nodules diagnosed in July of 2014. EGFR mutation as well as ALK gene translocation are negative.   PRIOR THERAPY: Palliative radiotherapy to the enlarged mediastinal and supraclavicular lymph nodes under the care of Dr. Kathrynn Running.   CURRENT THERAPY:  Systemic chemotherapy with carboplatin for AUC of 5 and Alimta 500 mg/M2 giving every 3 weeks. First dose given on 01/21/2013. Status post 3 cycles.  CHEMOTHERAPY INTENT: Palliative  CURRENT # OF CHEMOTHERAPY CYCLES: 3  CURRENT ANTIEMETICS: Zofran, dexamethasone and Compazine  CURRENT SMOKING STATUS: currently a nonsmoker  ORAL CHEMOTHERAPY AND CONSENT: None  CURRENT BISPHOSPHONATES USE: None  PAIN MANAGEMENT: No pain  NARCOTICS INDUCED CONSTIPATION: N/A  LIVING WILL AND CODE STATUS: Full code   INTERVAL HISTORY: Tyler [name] 79 y.o. male returns to the clinic today for followup visit accompanied by his girlfriend and daughter. The patient is still complaining of increasing fatigue and weakness. He also has shortness of breath with exertion and cough productive of whitish sputum. The patient tolerated the last cycle of his systemic chemotherapy fairly well except for the increasing fatigue. He denied having any fever or chills. He denied having any nausea or vomiting. He was admitted to [facility] on 03/16/2013 with new onset paroxysmal atrial fibrillation, hypotension and dehydration. He was evaluated by cardiology and was discharged home on diltiazem 30 mg when necessary for heart rate greater than 120. During his hospitalization he received 2 units of PRBCs  transfusion secondary to chemotherapy-induced anemia. The patient had a recent CT scan of the chest, abdomen and pelvis were restaging of his disease and he is here for evaluation and discussion of his scan results. He is very emotional and depressed because of his generalized fatigue and weakness and inability to do his regular activity.  MEDICAL HISTORY: Past Medical History  Diagnosis Date  . BENIGN PROSTATIC HYPERTROPHY 07/23/2008  . 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 HIP PAIN, LEFT 07/04/2007  . HYPERLIPIDEMIA 01/02/2007  . HYPERTENSION 01/02/2007  . INSOMNIA 11/16/2008  . PROSTATE SPECIFIC ANTIGEN, ELEVATED 09/10/2008  . Lung cancer     metastatic non small cell lung cancer, adenocarcinoma     ALLERGIES:  is allergic to vicodin.  MEDICATIONS:  Current Outpatient Prescriptions  Medication Sig Dispense Refill  . cholecalciferol (VITAMIN D) 1000 UNITS tablet Take 1,000 Units by mouth every morning.       Marland Kitchen dexamethasone (DECADRON) 4 MG tablet Take 4 mg by mouth 2 (two) times daily with a meal. Take while on Chemo      . diltiazem (CARDIZEM) 30 MG tablet Take 1 tablet (30 mg total) by mouth 4 (four) times daily as needed (for irregular heart rate  greater than 120.).  30 tablet  0  . docusate sodium (COLACE) 250 MG capsule Take 1 capsule (250 mg total) by mouth daily.  10 capsule  0  . feeding supplement (RESOURCE BREEZE) LIQD Take 1 Container by mouth daily.    0  . folic acid (FOLVITE) 1 MG tablet Take 1 mg by mouth every morning.      . multivitamin-iron-minerals-folic acid (CENTRUM) chewable tablet Chew 1 tablet by mouth every morning.       Marland Kitchen  prochlorperazine (COMPAZINE) 10 MG tablet Take 10 mg by mouth every 6 (six) hours as needed (nausea).       . senna (SENOKOT) 8.6 MG tablet Take 1 tablet by mouth daily as needed for constipation.      Marland Kitchen guaiFENesin-codeine (ROBITUSSIN AC) 100-10 MG/5ML syrup Take 1-2 tsp up to every 6 hours if needed for cough.  120 mL  0  .  HYDROcodone-homatropine (HYCODAN) 5-1.5 MG/5ML syrup Take 5 mLs by mouth every 6 (six) hours as needed for cough.  240 mL  0  . mirtazapine (REMERON) 15 MG tablet Take 1 tablet (15 mg total) by mouth at bedtime.  30 tablet  1   No current facility-administered medications for this visit.    SURGICAL HISTORY:  Past Surgical History  Procedure Laterality Date  . Transurethral resection of prostate  2004  . Total hip arthroplasty  2010, 2011    2010- left; 2011- right  . Upper gastrointestinal endoscopy      REVIEW OF SYSTEMS:  Constitutional: positive for anorexia and fatigue Eyes: negative Ears, nose, mouth, throat, and face: negative Respiratory: positive for cough, dyspnea on exertion and sputum Cardiovascular: negative Gastrointestinal: negative Genitourinary:negative Integument/breast: negative Hematologic/lymphatic: negative Musculoskeletal:negative Neurological: negative Behavioral/Psych: positive for anxiety, depression and fatigue Endocrine: negative Allergic/Immunologic: negative   PHYSICAL EXAMINATION: General appearance: alert, cooperative, fatigued and no distress Head: Normocephalic, without obvious abnormality, atraumatic Neck: no adenopathy, no JVD, supple, symmetrical, trachea midline and thyroid not enlarged, symmetric, no tenderness/mass/nodules Lymph nodes: Cervical, supraclavicular, and axillary nodes normal. Resp: clear to auscultation bilaterally Back: symmetric, no curvature. ROM normal. No CVA tenderness. Cardio: regular rate and rhythm, S1, S2 normal, no murmur, click, rub or gallop GI: soft, non-tender; bowel sounds normal; no masses,  no organomegaly Extremities: extremities normal, atraumatic, no cyanosis or edema Neurologic: Alert and oriented X 3, normal strength and tone. Normal symmetric reflexes. Normal coordination and gait  ECOG PERFORMANCE STATUS: 1 - Symptomatic but completely ambulatory  Blood pressure 123/86, pulse 91, temperature 98 F  (36.7 C), temperature source Oral, resp. rate 18, height 5\' 8"  (1.727 m), weight 162 lb 11.2 oz (73.8 kg), SpO2 100.00%.  LABORATORY DATA: Lab Results  Component Value Date   WBC 4.2 03/26/2013   HGB 10.0* 03/26/2013   HCT 29.2* 03/26/2013   MCV 86.1 03/26/2013   PLT 276 03/26/2013      Chemistry      Component Value Date/Time   NA 130* 03/26/2013 1042   NA 130* 03/17/2013 0522   K 4.4 03/26/2013 1042   K 3.9 03/17/2013 0522   CL 99 03/17/2013 0522   CO2 30* 03/26/2013 1042   CO2 24 03/17/2013 0522   BUN 13.2 03/26/2013 1042   BUN 14 03/17/2013 0522   CREATININE 1.3 03/26/2013 1042   CREATININE 0.98 03/17/2013 0522      Component Value Date/Time   CALCIUM 9.3 03/26/2013 1042   CALCIUM 8.1* 03/17/2013 0522   ALKPHOS 48 03/26/2013 1042   ALKPHOS 45 03/03/2013 0720   AST 15 03/26/2013 1042   AST 19 03/03/2013 0720   ALT 14 03/26/2013 1042   ALT 22 03/03/2013 0720   BILITOT 0.44 03/26/2013 1042   BILITOT 0.3 03/03/2013 0720       RADIOGRAPHIC STUDIES: Ct Soft Tissue Neck W Contrast  03/24/2013   CLINICAL DATA:  79 year old male with lung cancer. Dysphagia, cough, shortness of breath, nausea. Radiation completed and chemotherapy in progress. Subsequent encounter.  EXAM: CT NECK WITH CONTRAST  TECHNIQUE:  Multidetector CT imaging of the neck was performed using the standard protocol following the bolus administration of intravenous contrast.  CONTRAST:  OMNIPAQUE IOHEXOL 300 MG/ML SOLN in conjunction with CT(s) of the chest, abdomen, and pelvis reported separately.  COMPARISON:  PET-CT 12/18/2012.  FINDINGS: Chest findings are reported separately.  Bilateral level 4 / thoracic inlet lymphadenopathy re- identified, mildly greater on the right. Individual nodes measure up to 11 mm short axis. No significant change allowing for differences in imaging technique since 12/18/2012.  Smaller, rounded right level 3 nodes measuring up to 6 mm also appear not significantly changed.  No level 2 or level 1  lymphadenopathy. No level 5 lymphadenopathy.  Negative nasopharynx and parapharyngeal spaces. Negative retropharyngeal space. Motion artifact at the oral pharynx and hypopharynx which appear within normal limits. Larynx remarkable for left vocal cord paralysis. No laryngeal mass. Thyroid within normal limits.  Major vascular structures in the neck and at the skullbase are patent. Visualized orbit soft tissues are within normal limits. Grossly negative visualized brain parenchyma. Calcified atherosclerosis at the skull base. Visualized paranasal sinuses and mastoids are clear.  TMJ degeneration on the right. Advanced degenerative changes in the cervical spine. No acute or suspicious osseous lesion identified in the neck.  IMPRESSION: 1. Malignant bilateral level 4/thoracic inlet lymphadenopathy not significantly changed since 12/18/2012. Smaller abnormal right level 3 nodes also stable. No new or progressed metastatic disease identified in the neck.  2. Chest abdomen and pelvis reported separately.   Electronically Signed   By: Augusto Gamble M.D.   On: 03/24/2013 14:11   Ct Chest W Contrast  03/24/2013   CLINICAL DATA:  Metastatic non-small cell adenocarcinoma of the left upper lobe with bilateral pulmonary nodules and extensive mediastinal in supraclavicular adenopathy, diagnosed in July 2014. Prior palliative radiotherapy and current systemic chemotherapy  EXAM: CT CHEST, ABDOMEN, AND PELVIS WITH CONTRAST  TECHNIQUE: Multidetector CT imaging of the chest, abdomen and pelvis was performed following the standard protocol during bolus administration of intravenous contrast.  CONTRAST:  OMNIPAQUE IOHEXOL 300 MG/ML  SOLN  COMPARISON:  12/18/2012.  FINDINGS: CT CHEST FINDINGS  Left supraclavicular node 1.2 cm in short axis, formerly 1.4 cm. Indistinct right paratracheal node 1.8 cm in short axis, image 17 of series 2, previously 2.4 cm. Indistinct left lower paratracheal node 2.0 cm in short axis, currently 1.4 cm.  AP window lymph node 1.4 cm in short axis on image 23 of series 2, formerly 1.8 cm.  Moderate to large pericardial effusion is nonetheless reduced in size compared to prior exam.  Dense coronary artery atherosclerosis.  Severe emphysema noted. Dominant left upper lobe mass Measures 3.9 x 3.0 cm (Formerly 3.7 x 2.6 cm by my measurements) but is clearly cavitary on today's exam with internal fluid and gas density and little in the way of internal enhancement. Previously this lesion had a more solid appearance.  0.9 cm nodule posteriorly in the left upper lobe appears much less solid than it did previously, and previously was 1.0 cm in diameter.  Scattered nodules in both lungs are again noted, some spiculated, some sharply defined, and some of ground-glass signal intensity. For the most part these nodules appear stable, although some appear slightly different, including the prior 1.3 cm a right middle lobe cavitary nodule which no has more of a ground-glass appearance on image 47 of series 7, but with similar overall size. I do not observe a definite new nodule.  CT ABDOMEN AND PELVIS FINDINGS  Breathing motion artifact noted. Mild elevation of the left hemidiaphragm.  The liver, spleen, pancreas, and adrenal glands appear unremarkable. No definite nodularity is identified to correlate with the hypermetabolic activity noted along the inferior margin of the left adrenal gland on prior PET-CT. Bilateral renal cysts observed, including a 4.8 x 4.3 cm left kidney upper pole cyst which probably has very faint internal septation and accordingly qualifies as a Bosniak category 2 cyst. There is mild heterogeneity of enhancement in the left kidney lower pole which could reflect a localized vascular or inflammatory abnormality.  Fusiform infrarenal abdominal aortic aneurysm with considerable mural thrombus, measuring 7.2 cm anterior-posterior by 7.9 cm transverse. By my measurements this was previously 7.1 x 7.4 cm. The mural  thrombus appears to have higher density peripherally, and this could conceivably represent a subacute component of the mural thrombus or possibly even subacute intramural hematoma. No obvious retroperitoneal leak. The aneurysm extends down to the bifurcation or both common iliac arteries are ectatic.  Bilateral inguinal hernias contain adipose tissue. Bilateral hip implants noted.  Lumbar spondylosis and degenerative disc disease with multilevel osseous foraminal narrowing noted.  IMPRESSION: CT CHEST IMPRESSION  1. The overall pattern is of mild improvement, although many of the pulmonary nodules have not significantly changed. The dominant pulmonary mass in the left upper lobe actually measures slightly larger, but is clearly necrotic internally, with internal gas and fluid density. Measured adenopathy has all improved mildly. 2. Moderate to large pericardial effusion, reduced in size compared to prior exam. 3. Atherosclerosis. 4. Emphysema.  CT ABDOMEN AND PELVIS IMPRESSION  1. Prior nodularity of the left adrenal gland is no longer appreciated. 2. Increase in size of infrarenal abdominal aortic aneurysm. Possible subacute component of the mural thrombus given the mixed density of the mural thrombus. Subacute intramural hematoma is a differential diagnostic consideration. No obvious extra vascular leak although the size of the aneurysm and mixed density of the mural thrombus does plate place the patient at risk for aneurysm rupture. 3. Lumbar spondylosis and degenerative disc disease. 4. Mild elevation of left hemidiaphragm. 5. Mildly heterogeneous enhancement in portions of the left kidney lower pole could reflect inflammation or avascular abnormality. Correlate with urine analysis to exclude the possibility of early pyelonephritis.   Electronically Signed   By: Herbie Baltimore M.D.   On: 03/24/2013 14:57   Ct Abdomen Pelvis W Contrast  03/24/2013   CLINICAL DATA:  Metastatic non-small cell adenocarcinoma of  the left upper lobe with bilateral pulmonary nodules and extensive mediastinal in supraclavicular adenopathy, diagnosed in July 2014. Prior palliative radiotherapy and current systemic chemotherapy  EXAM: CT CHEST, ABDOMEN, AND PELVIS WITH CONTRAST  TECHNIQUE: Multidetector CT imaging of the chest, abdomen and pelvis was performed following the standard protocol during bolus administration of intravenous contrast.  CONTRAST:  OMNIPAQUE IOHEXOL 300 MG/ML  SOLN  COMPARISON:  12/18/2012.  FINDINGS: CT CHEST FINDINGS  Left supraclavicular node 1.2 cm in short axis, formerly 1.4 cm. Indistinct right paratracheal node 1.8 cm in short axis, image 17 of series 2, previously 2.4 cm. Indistinct left lower paratracheal node 2.0 cm in short axis, currently 1.4 cm. AP window lymph node 1.4 cm in short axis on image 23 of series 2, formerly 1.8 cm.  Moderate to large pericardial effusion is nonetheless reduced in size compared to prior exam.  Dense coronary artery atherosclerosis.  Severe emphysema noted. Dominant left upper lobe mass Measures 3.9 x 3.0 cm (Formerly 3.7 x 2.6 cm by my measurements)  but is clearly cavitary on today's exam with internal fluid and gas density and little in the way of internal enhancement. Previously this lesion had a more solid appearance.  0.9 cm nodule posteriorly in the left upper lobe appears much less solid than it did previously, and previously was 1.0 cm in diameter.  Scattered nodules in both lungs are again noted, some spiculated, some sharply defined, and some of ground-glass signal intensity. For the most part these nodules appear stable, although some appear slightly different, including the prior 1.3 cm a right middle lobe cavitary nodule which no has more of a ground-glass appearance on image 47 of series 7, but with similar overall size. I do not observe a definite new nodule.  CT ABDOMEN AND PELVIS FINDINGS  Breathing motion artifact noted. Mild elevation of the left  hemidiaphragm.  The liver, spleen, pancreas, and adrenal glands appear unremarkable. No definite nodularity is identified to correlate with the hypermetabolic activity noted along the inferior margin of the left adrenal gland on prior PET-CT. Bilateral renal cysts observed, including a 4.8 x 4.3 cm left kidney upper pole cyst which probably has very faint internal septation and accordingly qualifies as a Bosniak category 2 cyst. There is mild heterogeneity of enhancement in the left kidney lower pole which could reflect a localized vascular or inflammatory abnormality.  Fusiform infrarenal abdominal aortic aneurysm with considerable mural thrombus, measuring 7.2 cm anterior-posterior by 7.9 cm transverse. By my measurements this was previously 7.1 x 7.4 cm. The mural thrombus appears to have higher density peripherally, and this could conceivably represent a subacute component of the mural thrombus or possibly even subacute intramural hematoma. No obvious retroperitoneal leak. The aneurysm extends down to the bifurcation or both common iliac arteries are ectatic.  Bilateral inguinal hernias contain adipose tissue. Bilateral hip implants noted.  Lumbar spondylosis and degenerative disc disease with multilevel osseous foraminal narrowing noted.  IMPRESSION: CT CHEST IMPRESSION  1. The overall pattern is of mild improvement, although many of the pulmonary nodules have not significantly changed. The dominant pulmonary mass in the left upper lobe actually measures slightly larger, but is clearly necrotic internally, with internal gas and fluid density. Measured adenopathy has all improved mildly. 2. Moderate to large pericardial effusion, reduced in size compared to prior exam. 3. Atherosclerosis. 4. Emphysema.  CT ABDOMEN AND PELVIS IMPRESSION  1. Prior nodularity of the left adrenal gland is no longer appreciated. 2. Increase in size of infrarenal abdominal aortic aneurysm. Possible subacute component of the mural  thrombus given the mixed density of the mural thrombus. Subacute intramural hematoma is a differential diagnostic consideration. No obvious extra vascular leak although the size of the aneurysm and mixed density of the mural thrombus does plate place the patient at risk for aneurysm rupture. 3. Lumbar spondylosis and degenerative disc disease. 4. Mild elevation of left hemidiaphragm. 5. Mildly heterogeneous enhancement in portions of the left kidney lower pole could reflect inflammation or avascular abnormality. Correlate with urine analysis to exclude the possibility of early pyelonephritis.   Electronically Signed   By: Herbie Baltimore M.D.   On: 03/24/2013 14:57   Dg Chest Portable 1 View  03/16/2013   CLINICAL DATA:  New onset atrial fibrillation, history hypertension, hyperlipidemia, lung cancer, former smoker, initial encounter  EXAM: PORTABLE CHEST - 1 VIEW  COMPARISON:  Portable exam 0853 hr compared to 03/03/2013  FINDINGS: Chronic elevation left diaphragm.  Borderline enlargement of cardiac silhouette.  Calcified tortuous aorta.  Pulmonary vascularity normal.  Chronic accentuation of interstitial markings in the mid to lower lungs similar to previous exam.  No definite acute infiltrate, pleural effusion or pneumothorax.  Question minimal chronic left basilar atelectasis.  Bones demineralized.  IMPRESSION: Suspect minimal chronic left basilar atelectasis with chronic elevation of left diaphragm.  No acute abnormalities.   Electronically Signed   By: Ulyses Southward M.D.   On: 03/16/2013 09:06   Dg Chest Port 1 View  03/03/2013   CLINICAL DATA:  79 year old male dizziness. History of lung cancer.  EXAM: PORTABLE CHEST - 1 VIEW  COMPARISON:  01/25/2013 and earlier.  FINDINGS: Portable AP semi upright view at 0807 hrs. Decreased left lung volume. No significant change in increased left suprahilar density and spiculations extending into the left apex. No pneumothorax or pulmonary edema. No pleural effusion.  Stable cardiac size and mediastinal contours. Visualized tracheal air column is within normal limits.  IMPRESSION: Decreased left lung volume. This might be treatment related if the patient has undergone XRT for the left lung mass. No superimposed pneumothorax, edema, or pleural effusion.   Electronically Signed   By: Augusto Gamble M.D.   On: 03/03/2013 08:29    ASSESSMENT AND PLAN: This is a very pleasant 79 years old white male with metastatic non-small cell lung cancer, adenocarcinoma with negative EGFR mutation and negative ALK gene translocation currently on systemic chemotherapy with carboplatin and Alimta status post 3 cycles. 1) metastatic non-small cell lung cancer: He has some improvement in his disease on the recent scan of the chest, abdomen and pelvis and a stable lymphadenopathy in the neck area. I have a lengthy discussion with the patient and his family about his current disease status and further treatment options. I gave him the option of proceeding with 3 more cycles of systemic chemotherapy with the same regimen followed by restaging scan and consideration of maintenance treatment with single agent Alimta if he has no evidence for disease progression after cycle #6. The patient was also given him the option of proceeding directly to maintenance therapy with single agent Alimta because of his persistent fatigue and weakness. He would have time to think about these options in the next 2 weeks as his treatment is currently on hold to deal with the enlarging abdominal aortic aneurysm. 2) abdominal aortic aneurysm: The recent CT scan of the abdomen showed enlarging size of infrarenal abdominal aortic aneurysm. Possible subacute component of the mural thrombus given the mixed density of the mural thrombus. This is incidental finding on the scan. I will refer the patient to vascular surgery for evaluation. He is scheduled to see Dr. Leonides Sake tomorrow for evaluation of this condition. I would hold  his chemotherapy for the next 2 weeks or more if needed to address his enlarging abdominal aortic aneurysm. 3) depression: I will start the patient on low-dose Remeron 15 mg by mouth each bedtime. 4) cough: I will start the patient on Hycodan 5 ML by mouth every 6 hours as needed. 5) chemotherapy-induced anemia: Stable. We'll monitor for now. The patient and his family had several questions and I spent almost 50 minutes with them in discussion of his current condition and treatment plan. The patient would come back for followup visit in 2 weeks.  The patient voices understanding of current disease status and treatment options and is in agreement with the current care plan.  All questions were answered. The patient knows to call the clinic with any problems, questions or concerns. We can certainly see the patient  much sooner if necessary.  I spent 40 minutes counseling the patient face to face. The total time spent in the appointment was 50 minutes.

## 2013-03-27 NOTE — Progress Notes (Addendum)
[facility] [place]  Referred by: Frederich Chick, MD [address] [place], [place] [address]  Reason for referral: AAA  History of Present Illness  The patient is a 79 y.o. ([birth year]) male with stage IV lung cancer who presents with chief complaint: difficulty breathing and fatigue. The patient has undergone 3 rounds of chemo/XRT for his lung cancer.  On his stage CT, an enlarged AAA was identified.  The patient does not have back or abdominal pain.  The patient does not history of embolic episodes from the AAA.  The patient's risk factors for AAA included: male sex, age, and prior smoking.  The patient not smoke cigarettes currently.  The pt was told his cancer has respond to his treatment.  Past Medical History  Diagnosis Date  . BENIGN PROSTATIC HYPERTROPHY 07/23/2008  . 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 HIP PAIN, LEFT 07/04/2007  . HYPERLIPIDEMIA 01/02/2007  . HYPERTENSION 01/02/2007  . INSOMNIA 11/16/2008  . PROSTATE SPECIFIC ANTIGEN, ELEVATED 09/10/2008  . Lung cancer     metastatic non small cell lung cancer, adenocarcinoma     Past Surgical History  Procedure Laterality Date  . Transurethral resection of prostate  2004  . Total hip arthroplasty  2010, 2011    2010- left; 2011- right  . Upper gastrointestinal endoscopy      History   Social History  . Marital Status: Widowed    Spouse Name: N/A    Number of Children: N/A  . Years of Education: N/A   Occupational History  . Retired Teacher, early years/pre    Social History Main Topics  . Smoking status: Former Smoker -- 1.00 packs/day for 40 years    Types: Cigarettes    Quit date: 06/18/1998  . Smokeless tobacco: Never Used  . Alcohol Use: No  . Drug Use: No  . Sexual Activity: No   Other Topics Concern  . Not on file   Social History Narrative  . No narrative on file    Family History  Problem Relation Age of Onset  . Colon polyps Neg Hx   . Stomach cancer Neg Hx   .  Colon cancer Neg Hx   . Cancer Neg Hx     Current Outpatient Prescriptions on File Prior to Visit  Medication Sig Dispense Refill  . cholecalciferol (VITAMIN D) 1000 UNITS tablet Take 1,000 Units by mouth every morning.       . diltiazem (CARDIZEM) 30 MG tablet Take 1 tablet (30 mg total) by mouth 4 (four) times daily as needed (for irregular heart rate  greater than 120.).  30 tablet  0  . docusate sodium (COLACE) 250 MG capsule Take 1 capsule (250 mg total) by mouth daily.  10 capsule  0  . folic acid (FOLVITE) 1 MG tablet Take 1 mg by mouth every morning.      Marland Kitchen HYDROcodone-homatropine (HYCODAN) 5-1.5 MG/5ML syrup Take 5 mLs by mouth every 6 (six) hours as needed for cough.  240 mL  0  . mirtazapine (REMERON) 15 MG tablet Take 1 tablet (15 mg total) by mouth at bedtime.  30 tablet  1  . multivitamin-iron-minerals-folic acid (CENTRUM) chewable tablet Chew 1 tablet by mouth every morning.       . senna (SENOKOT) 8.6 MG tablet Take 1 tablet by mouth daily as needed for constipation.      Marland Kitchen dexamethasone (DECADRON) 4 MG tablet Take 4 mg by mouth 2 (two) times daily with a meal.  Take while on Chemo      . feeding supplement (RESOURCE BREEZE) LIQD Take 1 Container by mouth daily.    0  . guaiFENesin-codeine (ROBITUSSIN AC) 100-10 MG/5ML syrup Take 1-2 tsp up to every 6 hours if needed for cough.  120 mL  0  . prochlorperazine (COMPAZINE) 10 MG tablet Take 10 mg by mouth every 6 (six) hours as needed (nausea).        No current facility-administered medications on file prior to visit.    Allergies  Allergen Reactions  . Vicodin [Hydrocodone-Acetaminophen] Other (See Comments)    hallucinations      REVIEW OF SYSTEMS:  (Positives checked otherwise negative)  CARDIOVASCULAR:  []  chest pain, []  chest pressure, []  palpitations, []  shortness of breath when laying flat, [x]  shortness of breath with exertion,  [x]  pain in feet when walking, []  pain in feet when laying flat, []  history of blood  clot in veins (DVT), []  history of phlebitis, []  swelling in legs, []  varicose veins  PULMONARY:  [x]  productive cough, []  asthma, []  wheezing  NEUROLOGIC:  [x]  weakness in arms or legs, []  numbness in arms or legs, [x]  difficulty speaking or slurred speech, []  temporary loss of vision in one eye, [x]  dizziness  HEMATOLOGIC:  []  bleeding problems, []  problems with blood clotting too easily  MUSCULOSKEL:  []  joint pain, []  joint swelling  GASTROINTEST:  []  vomiting blood, []  blood in stool     GENITOURINARY:  []  burning with urination, []  blood in urine  PSYCHIATRIC:  []  history of major depression  INTEGUMENTARY:  []  rashes, []  ulcers  CONSTITUTIONAL:  []  fever, []  chills  For VQI Use Only  PRE-ADM LIVING: Home  AMB STATUS: Ambulatory with Assistance  CAD Sx: None  PRIOR CHF: None  STRESS TEST: [x]  No, [ ]  Normal, [ ]  + ischemia, [ ]  + MI, [ ]  Both  Physical Examination  Filed Vitals:   03/27/13 1418  Height: 5\' 8"  (1.727 m)  Weight: 162 lb 12.8 oz (73.846 kg)   Body mass index is 24.76 kg/(m^2).  General: A&O x 3, WD, elderly, Cachectic , weak appearing  Head: [place]/AT, Temporalis wasting   Ear/Nose/Throat: Hearing grossly intact, nares w/o erythema or drainage, oropharynx w/o Erythema/Exudate  Eyes: PERRL, EOMI, cataract evident in both lenses  Neck: Supple, no nuchal rigidity, no palpable LAD  Pulmonary: Sym exp, good air movt, CTAB, no rales, rhonchi, & wheezing  Cardiac: RRR, faint HS, no Murmurs, rubs or gallops  Vascular: Vessel Right Left  Radial Palpable Palpable  Brachial Palpable Palpable  Carotid Palpable, without bruit Palpable, without bruit  Aorta Prominently palpable N/A  Femoral Palpable Palpable  Popliteal Not palpable Not palpable  PT Not Palpable Palpable  DP Not Palpable Palpable   Gastrointestinal: soft, NTND, -G/R, - HSM, - masses, - CVAT B, umbilical hernia, and large left inguinal hernia  Musculoskeletal: M/S 5/5 throughout ,  Extremities without ischemic changes   Neurologic: CN 2-12 intact , Pain and light touch intact in extremities , Motor exam as listed above  Psychiatric: Judgment intact, depressed mood & affect appropriate for pt's clinical situation  Dermatologic: See M/S exam for extremity exam, no rashes otherwise noted  Lymph : No inguinal lymphadenopathy, faintly palpable left subclavian LAD, no axillary LAD   CT Chest/abd/pelvis (03/24/13)  Chest: 1. The overall pattern is of mild improvement, although many of the pulmonary nodules have not significantly changed. The dominant pulmonary mass in the left upper lobe actually measures  slightly larger, but is clearly necrotic internally, with internal gas and fluid density. Measured adenopathy has all improved mildly.  2. Moderate to large pericardial effusion, reduced in size compared to prior exam.  3. Atherosclerosis.  4. Emphysema.   Abd/pelvis: 1. Prior nodularity of the left adrenal gland is no longer appreciated.  2. Increase in size of infrarenal abdominal aortic aneurysm. Possible subacute component of the mural thrombus given the mixed density of the mural thrombus. Subacute intramural hematoma is a differential diagnostic consideration. No obvious extra vascular leak although the size of the aneurysm and mixed density of the mural thrombus does plate place the patient at risk for aneurysm rupture.  3. Lumbar spondylosis and degenerative disc disease.  4. Mild elevation of left hemidiaphragm.  5. Mildly heterogeneous enhancement in portions of the left kidney lower pole could reflect inflammation or avascular abnormality. Correlate with urine analysis to exclude the possibility of early pyelonephritis.   Based on my review of the CT, the patient has a large AAA, which may not be amendable to traditional endograft as the neck as the aortic size at 15 mm distal to renal arteries exceeds the size of most endograft.  Anatomy might be compatible with  FEVAR or EVAR with renal snorkeling.  Outside Studies/Documentation 3 pages of outside documents were reviewed including: oncologist recent note.  Medical Decision Making  The patient is a 78 y.o. male who presents with: sx Stage IV lung cancer, large asx AAA.   I doubt this patient would survive an open aortic repair given his general debilitated state.  Albumin only 2.8 reflecting deplete protein stores.  His anatomy is not compatible with use of a Gore or Cook traditional endograft based on my review.  I will have my Gore rep review the CT also to see if he agrees.  I'm not certain how well this patient would do under MAC/local anes. for an endograft.  I have a suspicion if he requires general endotracheal intubation, he might not extubate.  Subsequently, I suspect the risk to benefit ratio for EVAR in this patient, especially if the indications for use have to be violated, do not benefit proceeding with such.  I will contact his oncologist on Monday to get any idea of his possible longevity to help determine if any intervention is justifiable.  Thank you for allowing [place] to participate in this patient's care.  Leonides Sake, MD [facility] Office: [phone] Pager: [phone]  03/27/2013, 5:54 PM  Addendum  On 3D reconstructions, the neck appears to be barely adequate at 15 mm.  My Emeline Darling rep thinks the C3 will be able to seal the aneurysm.  There is some concern with the tortuosity of the right iliac system.  I have also discussed this patient's case with Dr. Arbutus Ped who thinks the patient has >6-12 month lifespans.  At this point, we will arrange an outpatient aortogram to get better measurements of the anatomy and also determine if the right iliac system can be traversed.    Leonides Sake, MD [facility] Office: [phone] Pager: [phone]  04/01/2013, 1:50 PM  Addendum  I have discussed with the  patient's significant other the need for the angiogram to determine if he is an EVAR candidate.  The patient will let us know if he is interested in proceeding.    Leonides Sake, MD [facility] Office: [phone] Pager: [phone]  04/01/2013, 2:04 PM

## 2013-03-27 NOTE — Patient Instructions (Signed)
CURRENT THERAPY:  Systemic chemotherapy with carboplatin for AUC of 5 and Alimta 500 mg/M2 giving every 3 weeks. First dose given on 01/21/2013. Status post 3 cycles.  CHEMOTHERAPY INTENT: Palliative  CURRENT # OF CHEMOTHERAPY CYCLES: 3  CURRENT ANTIEMETICS: Zofran, dexamethasone and Compazine  CURRENT SMOKING STATUS: currently a nonsmoker  ORAL CHEMOTHERAPY AND CONSENT: None  CURRENT BISPHOSPHONATES USE: None  PAIN MANAGEMENT: No pain  NARCOTICS INDUCED CONSTIPATION: N/A  LIVING WILL AND CODE STATUS: Full code

## 2013-03-29 NOTE — Telephone Encounter (Signed)
noted 

## 2013-04-01 ENCOUNTER — Encounter: Payer: Self-pay | Admitting: *Deleted

## 2013-04-01 ENCOUNTER — Telehealth: Payer: Self-pay | Admitting: Medical Oncology

## 2013-04-01 ENCOUNTER — Telehealth: Payer: Self-pay | Admitting: *Deleted

## 2013-04-01 NOTE — Telephone Encounter (Addendum)
Natalie Mapou called re: Tyler [name]; He does not wish to have the angiogram tomorrow. Dr. Imogene Burn had discussed doing this with the patient and Dr. Shirline Frees. Patient states that he will keep his appt with Dr. Imogene Burn as planned on 04-03-13.

## 2013-04-01 NOTE — Telephone Encounter (Signed)
Santiago Bumpers is Mr. Brodrick' significant other and she relayed the above message to our office.

## 2013-04-01 NOTE — Telephone Encounter (Signed)
Tyler [name] asking if Dr Arbutus Ped has talked with Dr Imogene Burn about anurysm. Pt saw Dr Imogene Burn on 03/27/13.

## 2013-04-01 NOTE — Telephone Encounter (Signed)
Pt changed his mind and does not want  aortagram as scheduled for tomorrow and Gray will f/u with Dr Imogene Burn on friday

## 2013-04-01 NOTE — Progress Notes (Signed)
Dr. Rebecca Eaton nurse, Vincent Peyer RN, was made aware that this patient would not be having angiogram tomorrow and that we will see him on Friday.

## 2013-04-02 ENCOUNTER — Other Ambulatory Visit: Payer: Medicare Other

## 2013-04-02 ENCOUNTER — Encounter: Payer: Self-pay | Admitting: Vascular Surgery

## 2013-04-03 ENCOUNTER — Encounter: Payer: Self-pay | Admitting: Vascular Surgery

## 2013-04-03 ENCOUNTER — Ambulatory Visit (INDEPENDENT_AMBULATORY_CARE_PROVIDER_SITE_OTHER): Payer: Medicare Other | Admitting: Vascular Surgery

## 2013-04-03 VITALS — BP 127/87 | HR 83 | Ht 68.0 in | Wt 161.0 lb

## 2013-04-03 DIAGNOSIS — I714 Abdominal aortic aneurysm, without rupture: Secondary | ICD-10-CM

## 2013-04-03 NOTE — Progress Notes (Signed)
[facility] [place]  Established Abdominal Aortic Aneurysm  History of Present Illness  The patient is a 79 y.o. (06/29/1932) male who presents with chief complaint: follow up for AAA.  Based on review of CT abd/pelvis, pt probably candidate for EVAR.  Pt would like proceed.  He has stated he would request repair if he came to the ED with a ruptured AAA.  His oncologist thinks he will be alive over the next 6-12 months.  Past Medical History  Diagnosis Date  . BENIGN PROSTATIC HYPERTROPHY 07/23/2008  . 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 HIP PAIN, LEFT 07/04/2007  . HYPERLIPIDEMIA 01/02/2007  . HYPERTENSION 01/02/2007  . INSOMNIA 11/16/2008  . PROSTATE SPECIFIC ANTIGEN, ELEVATED 09/10/2008  . Lung cancer     metastatic non small cell lung cancer, adenocarcinoma   . AAA (abdominal aortic aneurysm)     Past Surgical History  Procedure Laterality Date  . Transurethral resection of prostate  2004  . Total hip arthroplasty  2010, 2011    2010- left; 2011- right  . Upper gastrointestinal endoscopy      History   Social History  . Marital Status: Widowed    Spouse Name: N/A    Number of Children: N/A  . Years of Education: N/A   Occupational History  . Retired Pharmacist    Social History Main Topics  . Smoking status: Former Smoker -- 1.00 packs/day for 40 years    Types: Cigarettes    Quit date: 06/18/1998  . Smokeless tobacco: Never Used  . Alcohol Use: No  . Drug Use: No  . Sexual Activity: No   Other Topics Concern  . Not on file   Social History Narrative  . No narrative on file   Family History  Problem Relation Age of Onset  . Colon polyps Neg Hx   . Stomach cancer Neg Hx   . Colon cancer Neg Hx   . Cancer Neg Hx    Current Outpatient Prescriptions on File Prior to Visit  Medication Sig Dispense Refill  . cholecalciferol (VITAMIN D) 1000 UNITS tablet Take 1,000 Units by mouth every morning.       . clotrimazole (MYCELEX) 10 MG  troche       . dexamethasone (DECADRON) 4 MG tablet Take 4 mg by mouth 2 (two) times daily with a meal. Take while on Chemo      . docusate sodium (COLACE) 250 MG capsule Take 1 capsule (250 mg total) by mouth daily.  10 capsule  0  . folic acid (FOLVITE) 1 MG tablet Take 1 mg by mouth every morning.      . HYDROcodone-homatropine (HYCODAN) 5-1.5 MG/5ML syrup Take 5 mLs by mouth every 6 (six) hours as needed for cough.  240 mL  0  . mirtazapine (REMERON) 15 MG tablet Take 1 tablet (15 mg total) by mouth at bedtime.  30 tablet  1  . multivitamin-iron-minerals-folic acid (CENTRUM) chewable tablet Chew 1 tablet by mouth every morning.       . prochlorperazine (COMPAZINE) 10 MG tablet Take 10 mg by mouth every 6 (six) hours as needed (nausea).       . senna (SENOKOT) 8.6 MG tablet Take 1 tablet by mouth daily as needed for constipation.      . tamsulosin (FLOMAX) 0.4 MG CAPS capsule       . amoxicillin (AMOXIL) 875 MG tablet       . citalopram (CELEXA) 10 MG tablet       .   diltiazem (CARDIZEM) 30 MG tablet Take 1 tablet (30 mg total) by mouth 4 (four) times daily as needed (for irregular heart rate  greater than 120.).  30 tablet  0  . diltiazem (DILACOR XR) 240 MG 24 hr capsule       . feeding supplement (RESOURCE BREEZE) LIQD Take 1 Container by mouth daily.    0  . fluconazole (DIFLUCAN) 200 MG tablet       . guaiFENesin-codeine (ROBITUSSIN AC) 100-10 MG/5ML syrup Take 1-2 tsp up to every 6 hours if needed for cough.  120 mL  0  . levofloxacin (LEVAQUIN) 750 MG tablet       . lidocaine (XYLOCAINE) 2 % solution       . LORazepam (ATIVAN) 1 MG tablet       . omeprazole (PRILOSEC) 20 MG capsule       . predniSONE (DELTASONE) 10 MG tablet       . sucralfate (CARAFATE) 1 G tablet       . traMADol (ULTRAM) 50 MG tablet        No current facility-administered medications on file prior to visit.    Allergies  Allergen Reactions  . Vicodin [Hydrocodone-Acetaminophen] Other (See Comments)     hallucinations   REVIEW OF SYSTEMS: (Positives checked otherwise negative)  CARDIOVASCULAR: [] chest pain, [] chest pressure, [] palpitations, [] shortness of breath when laying flat, [x] shortness of breath with exertion, [x] pain in feet when walking, [] pain in feet when laying flat, [] history of blood clot in veins (DVT), [] history of phlebitis, [] swelling in legs, [] varicose veins  PULMONARY: [x] productive cough, [] asthma, [] wheezing  NEUROLOGIC: [x] weakness in arms or legs, [] numbness in arms or legs, [x] difficulty speaking or slurred speech, [] temporary loss of vision in one eye, [x] dizziness  HEMATOLOGIC: [] bleeding problems, [] problems with blood clotting too easily  MUSCULOSKEL: [] joint pain, [] joint swelling  GASTROINTEST: [] vomiting blood, [] blood in stool  GENITOURINARY: [] burning with urination, [] blood in urine  PSYCHIATRIC: [] history of major depression  INTEGUMENTARY: [] rashes, [] ulcers  CONSTITUTIONAL: [] fever, [] chills   For VQI Use Only  PRE-ADM LIVING: Home  AMB STATUS: Ambulatory with Assistance  CAD Sx: None  PRIOR CHF: None  STRESS TEST: [x] No, [ ] Normal, [ ] + ischemia, [ ] + MI, [ ] Both   Physical Examination  Filed Vitals:   04/03/13 1358  BP: 127/87  Pulse: 83  Height: 5' 8" (1.727 m)  Weight: 161 lb (73.029 kg)   Body mass index is 24.49 kg/(m^2).  General: A&O x 3, WD, elderly, Cachectic , weak appearing   Pulmonary: Sym exp, good air movt, CTAB, no rales, rhonchi, & wheezing   Cardiac: RRR, faint HS, no Murmurs, rubs or gallops   Vascular:  Vessel  Right  Left   Radial  Palpable  Palpable   Brachial  Palpable  Palpable   Carotid  Palpable, without bruit  Palpable, without bruit   Aorta  Prominently palpable  N/A   Femoral  Palpable  Palpable   Popliteal  Not palpable  Not palpable   PT  Not Palpable  Palpable   DP  Not Palpable  Palpable    Gastrointestinal: soft, NTND, -G/R, - HSM, - masses, - CVAT B,  umbilical hernia, and large left inguinal hernia   Musculoskeletal: M/S 5/5 throughout , Extremities without ischemic changes     Neurologic: Pain and light touch intact in extremities , Motor exam as listed above   Medical Decision Making  The patient is a 79 y.o. male who presents with: large asymptomatic AAA , Stage IV lung cancer, multiple co-morbiities   At the patient is going to request repair in the setting of acute ruptured AAA, the risk-benefit issues are irrelevant as he will be offered repair in anyway in an emergent setting, so it make more sense to go ahead in an elective fashion.  At a his size of aneurysm, his annual rupture rate is 30-40% minimally, so if his life expectancy is > 6-12 months, there is a real chance of rupture occuring.  The problem may be that his right iliac system might prevent placement of a the right iliac extension limb, so first an aortogram via right femoral approach is recommended.  If an Amplatz wire can traverse the right iliac system, it is reasonable to attempt the EVAR, knowing the patient still likely to died but more likely due to his lung cancer and multiple co-morbidities.  I have scheduled his aortogram for this coming Thursday, 23 OCT 14.    I discussed with the patient the nature of angiographic procedures, especially the limited patencies of any endovascular intervention.  The patient is aware of that the risks of an angiographic procedure include but are not limited to: bleeding, infection, access site complications, renal failure, embolization, rupture of vessel, dissection, possible need for emergent surgical intervention, possible need for surgical procedures to treat the patient's pathology, anaphylactic reaction to contrast, and stroke and death.    The patient is aware of the risks and agrees to proceed.  Thank you for allowing us to participate in this patient's care.  Elmon Shader, MD [facility] Office: [phone] Pager: [phone]  04/03/2013, 5:41 PM      

## 2013-04-06 ENCOUNTER — Other Ambulatory Visit: Payer: Self-pay

## 2013-04-07 ENCOUNTER — Telehealth: Payer: Self-pay | Admitting: Internal Medicine

## 2013-04-07 NOTE — Telephone Encounter (Signed)
Pt's wife called, pt having procedure and wouls not make it to the lab and ML appt on 10/23, called nurse left a VM

## 2013-04-08 ENCOUNTER — Telehealth: Payer: Self-pay | Admitting: *Deleted

## 2013-04-08 NOTE — Telephone Encounter (Signed)
Pt's girlfriend called stating that pt has a hx of melanoma that his dermatologist considered "non life threatening" and was needed it removed.  However, this was back in July and it was put on the back burner because of his lung cancer.  [name] is wondering if the skin cancer should be dealt with now.  Per Dr Donnald Garre, pt needs to have his AAA fixed as a priority, then after that he needs to contact his dermatologist to discuss this further.  Dr Donnald Garre would not be involved unless the skin cancer had spread.  Pt is scheduled for angiogram tomorrow.  Per Dr Donnald Garre, pt can have a f/u appt 2 weeks.  SLJ

## 2013-04-09 ENCOUNTER — Other Ambulatory Visit: Payer: Self-pay | Admitting: *Deleted

## 2013-04-09 ENCOUNTER — Telehealth: Payer: Self-pay | Admitting: Internal Medicine

## 2013-04-09 ENCOUNTER — Ambulatory Visit: Payer: Medicare Other | Admitting: Physician Assistant

## 2013-04-09 ENCOUNTER — Encounter (HOSPITAL_COMMUNITY): Payer: Self-pay | Admitting: *Deleted

## 2013-04-09 ENCOUNTER — Encounter (HOSPITAL_COMMUNITY)
Admission: RE | Disposition: A | Discharge status: Home / Self Care | Payer: Self-pay | Source: Ambulatory Visit | Attending: Vascular Surgery

## 2013-04-09 ENCOUNTER — Other Ambulatory Visit: Payer: Medicare Other | Admitting: Lab

## 2013-04-09 ENCOUNTER — Ambulatory Visit (HOSPITAL_COMMUNITY)
Admission: RE | Admit: 2013-04-09 | Discharge: 2013-04-09 | Disposition: A | Discharge status: Home / Self Care | Payer: Medicare Other | Source: Ambulatory Visit | Attending: Vascular Surgery | Admitting: Vascular Surgery

## 2013-04-09 ENCOUNTER — Other Ambulatory Visit (HOSPITAL_COMMUNITY): Payer: Medicare Other

## 2013-04-09 ENCOUNTER — Encounter (HOSPITAL_COMMUNITY): Payer: Self-pay | Admitting: Pharmacy Technician

## 2013-04-09 ENCOUNTER — Telehealth: Payer: Self-pay | Admitting: *Deleted

## 2013-04-09 ENCOUNTER — Encounter (HOSPITAL_COMMUNITY): Payer: Medicare Other

## 2013-04-09 DIAGNOSIS — E785 Hyperlipidemia, unspecified: Secondary | ICD-10-CM | POA: Insufficient documentation

## 2013-04-09 DIAGNOSIS — N400 Enlarged prostate without lower urinary tract symptoms: Secondary | ICD-10-CM | POA: Insufficient documentation

## 2013-04-09 DIAGNOSIS — Z79899 Other long term (current) drug therapy: Secondary | ICD-10-CM | POA: Insufficient documentation

## 2013-04-09 DIAGNOSIS — C3490 Malignant neoplasm of unspecified part of unspecified bronchus or lung: Secondary | ICD-10-CM | POA: Insufficient documentation

## 2013-04-09 DIAGNOSIS — I10 Essential (primary) hypertension: Secondary | ICD-10-CM | POA: Insufficient documentation

## 2013-04-09 DIAGNOSIS — I714 Abdominal aortic aneurysm, without rupture, unspecified: Secondary | ICD-10-CM | POA: Insufficient documentation

## 2013-04-09 DIAGNOSIS — G4700 Insomnia, unspecified: Secondary | ICD-10-CM | POA: Insufficient documentation

## 2013-04-09 DIAGNOSIS — IMO0002 Reserved for concepts with insufficient information to code with codable children: Secondary | ICD-10-CM | POA: Insufficient documentation

## 2013-04-09 DIAGNOSIS — Z87891 Personal history of nicotine dependence: Secondary | ICD-10-CM | POA: Insufficient documentation

## 2013-04-09 HISTORY — PX: ABDOMINAL AORTAGRAM: SHX5454

## 2013-04-09 LAB — COMPREHENSIVE METABOLIC PANEL
ALT: 9 U/L (ref 0–53)
AST: 14 U/L (ref 0–37)
Albumin: 2.6 g/dL — ABNORMAL LOW (ref 3.5–5.2)
CO2: 24 mEq/L (ref 19–32)
Calcium: 8.8 mg/dL (ref 8.4–10.5)
Chloride: 96 mEq/L (ref 96–112)
GFR calc non Af Amer: 58 mL/min — ABNORMAL LOW (ref >90)
Potassium: 4.1 mEq/L (ref 3.5–5.1)
Sodium: 132 mEq/L — ABNORMAL LOW (ref 135–145)
Total Bilirubin: 0.4 mg/dL (ref 0.3–1.2)
Total Protein: 6.2 g/dL (ref 6.0–8.3)

## 2013-04-09 LAB — POCT I-STAT, CHEM 8
BUN: 22 mg/dL (ref 6–23)
Calcium, Ion: 1.2 mmol/L (ref 1.13–1.30)
Creatinine, Ser: 1.5 mg/dL — ABNORMAL HIGH (ref 0.50–1.35)
Glucose, Bld: 108 mg/dL — ABNORMAL HIGH (ref 70–99)
HCT: 29 % — ABNORMAL LOW (ref 39.0–52.0)
Hemoglobin: 9.9 g/dL — ABNORMAL LOW (ref 13.0–17.0)
Potassium: 4.3 mEq/L (ref 3.5–5.1)
Sodium: 133 mEq/L — ABNORMAL LOW (ref 135–145)
TCO2: 27 mmol/L (ref 0–100)

## 2013-04-09 LAB — BLOOD GAS, ARTERIAL
Acid-Base Excess: 2.7 mmol/L — ABNORMAL HIGH (ref 0.0–2.0)
Drawn by: 344381
FIO2: 0.21 %
TCO2: 27 mmol/L (ref 0–100)
pCO2 arterial: 34.5 mmHg — ABNORMAL LOW (ref 35.0–45.0)
pH, Arterial: 7.488 — ABNORMAL HIGH (ref 7.350–7.450)
pO2, Arterial: 73 mmHg — ABNORMAL LOW (ref 80.0–100.0)

## 2013-04-09 LAB — CBC
HCT: 26.1 % — ABNORMAL LOW (ref 39.0–52.0)
MCHC: 34.1 g/dL (ref 30.0–36.0)
Platelets: 176 10*3/uL (ref 150–400)
RBC: 2.99 MIL/uL — ABNORMAL LOW (ref 4.22–5.81)
RDW: 16.9 % — ABNORMAL HIGH (ref 11.5–15.5)
WBC: 8.2 10*3/uL (ref 4.0–10.5)

## 2013-04-09 LAB — APTT: aPTT: 51 seconds — ABNORMAL HIGH (ref 24–37)

## 2013-04-09 LAB — SURGICAL PCR SCREEN
MRSA, PCR: NEGATIVE
Staphylococcus aureus: NEGATIVE

## 2013-04-09 SURGERY — ABDOMINAL AORTAGRAM
Anesthesia: LOCAL

## 2013-04-09 MED ORDER — SODIUM CHLORIDE 0.9 % IV SOLN
INTRAVENOUS | Status: DC
  Administered 2013-04-09: 11:00:00 via INTRAVENOUS

## 2013-04-09 MED ORDER — MIDAZOLAM HCL 2 MG/2ML IJ SOLN
INTRAMUSCULAR | Status: AC
  Filled 2013-04-09: qty 2

## 2013-04-09 MED ORDER — LIDOCAINE HCL (PF) 1 % IJ SOLN
INTRAMUSCULAR | Status: AC
  Filled 2013-04-09: qty 30

## 2013-04-09 MED ORDER — FENTANYL CITRATE 0.05 MG/ML IJ SOLN
INTRAMUSCULAR | Status: AC
  Filled 2013-04-09: qty 2

## 2013-04-09 NOTE — Progress Notes (Signed)
Discharge instructions given per MD order.  Pressure band applied to right antecubital site after IV removed due to bleeding from site.  Hemostasis was successful at that site.  Right groin site dry and intact.  CG was able to verbalize understanding of instruction given.  Pt denies any pain at this time.  Pt to car via wheelchair.

## 2013-04-09 NOTE — Progress Notes (Signed)
Primary physician - dr. Shirlean Mylar [facility] Pulmonary - dr. Kendrick Fries Does not see a cardiologist regularly Had new onset afib in sept 2014 but naturally converted in emergency room - ekg's in chart for this. Echo 02/2013 in epic

## 2013-04-09 NOTE — Op Note (Signed)
OPERATIVE NOTE   PROCEDURE: 1.  Right common femoral artery cannulation under ultrasound guidance 2.  Placement of catheter in aorta 3.  Aortogram with bilateral pelvic angiogram  PRE-OPERATIVE DIAGNOSIS: large abdominal aortic aneurysm, tortuous iliac vessels  POST-OPERATIVE DIAGNOSIS: same as above   SURGEON: Leonides Sake, MD  ANESTHESIA: conscious sedation  ESTIMATED BLOOD LOSS: 30 cc  CONTRAST: 50 cc  FINDING(S):  Aorta: patent with 7 cm AAA on A-P projection, ~2 cm neck with 26 mm aortic diameter at that point  Superior mesenteric artery: proximally patent Celiac artery: proximally patent   Right Left  RA Patent Patent, both at same level  CIA Patent but calcified Patent but calcified  EIA Patent but calcified Patent but calcified  IIA Patent Patent  CFA Patent but calcified Patent but calcified  SFA Patent proximally Patent proximally  PFA Patent with ~50-75% stenosis proximally Patent proximally   SPECIMEN(S):  none  INDICATIONS:   Tyler [name] is a 79 y.o. male who presents with large AAA with extremely tortuous iliac vessels.  I had some concerns if an iliac limb could navigate the right iliac artery system.  The patient presents for: aortogram.  I discussed with the patient the nature of angiographic procedures, especially the limited patencies of any endovascular intervention.  The patient is aware of that the risks of an angiographic procedure include but are not limited to: bleeding, infection, access site complications, renal failure, embolization, rupture of vessel, dissection, possible need for emergent surgical intervention, possible need for surgical procedures to treat the patient's pathology, and stroke and death.  The patient is aware of the risks and agrees to proceed.  DESCRIPTION: After full informed consent was obtained from the patient, the patient was brought back to the angiography suite.  The patient was placed supine upon the angiography table and  connected to monitoring equipment.  The patient was then given conscious sedation, the amounts of which are documented in the patient's chart.  The patient was prepped and drape in the standard fashion for an angiographic procedure.  At this point, attention was turned to the right groin.  Under ultrasound guidance, the right common femoral artery was cannulated with a 18 gauge needle.  The [facility] wire was passed up into the aorta.  The needle was exchanged for a 5-Fr sheath, which was advanced over the wire into the common femoral artery.  The dilator was then removed.  Surprisingly the [facility] wire navigated into the aorta without any difficulty.  The marker pigtail catheter was then loaded over the wire up to the level of L2.  The catheter was connected to the power injector circuit.  After de-airring and de-clotting the circuit, a power injector aortogram was completed.  I then pulled the catheter down to distal aorta.  LAO and RAO pelvic injections were completed.  The wire was replaced in the catheter to straighten it out.  The catheter and wire were removed.  The sheath was aspirated.  No clots were present and the sheath was reloaded with heparinized saline.    Based on the images, this patient's AAA should be amendable to EVAR with a Gore C3.  COMPLICATIONS: none  CONDITION: stable  Leonides Sake, MD [facility] [place] Office: [phone] Pager: [phone]  04/09/2013, 12:59 PM

## 2013-04-09 NOTE — Interval H&P Note (Signed)
History and Physical Interval Note:  04/09/2013 9:45 AM  Tyler [name]  has presented today for surgery, with the diagnosis of Abdominal aneurysm  The various methods of treatment have been discussed with the patient and family. After consideration of risks, benefits and other options for treatment, the patient has consented to  Procedure(s): ABDOMINAL AORTAGRAM (N/A) as a surgical intervention .  The patient's history has been reviewed, patient examined, no change in status, stable for surgery.  I have reviewed the patient's chart and labs.  Questions were answered to the patient's satisfaction.     CHEN,BRIAN LIANG-YU

## 2013-04-09 NOTE — Telephone Encounter (Signed)
Office visit dated 04/08/13 with Dr Shirlean Mylar at [facility] given to Dr Donnald Garre to review.  SLJ

## 2013-04-09 NOTE — Pre-Procedure Instructions (Signed)
Tyler [name]  04/09/2013   Your procedure is scheduled on:  Tuesday, October 28th  Report to Main Entrance "A" and check in with admitting at 0530 AM.  Call this number if you have problems the morning of surgery: [phone]   Remember:   Do not eat food or drink liquids after midnight.   Take these medicines the morning of surgery with A SIP OF WATER: propranolol   Do not wear jewelry.  Do not wear lotions, powders, or perfumes. You may wear deodorant.  Do not shave 48 hours prior to surgery. Men may shave face and neck.  Do not bring valuables to the hospital.  [facility] is not responsible   for any belongings or valuables.               Contacts, dentures or bridgework may not be worn into surgery.  Leave suitcase in the car. After surgery it may be brought to your room.  For patients admitted to the hospital, discharge time is determined by your treatment team.   Special Instructions: Shower using CHG 2 nights before surgery and the night before surgery.  If you shower the day of surgery use CHG.  Use special wash - you have one bottle of CHG for all showers.  You should use approximately 1/3 of the bottle for each shower.   Please read over the following fact sheets that you were given: Pain Booklet, Coughing and Deep Breathing, Blood Transfusion Information, MRSA Information and Surgical Site Infection Prevention

## 2013-04-09 NOTE — H&P (View-Only) (Signed)
[facility] [place]  Established Abdominal Aortic Aneurysm  History of Present Illness  The patient is a 79 y.o. (1932/07/06) male who presents with chief complaint: follow up for AAA.  Based on review of CT abd/pelvis, pt probably candidate for EVAR.  Pt would like proceed.  He has stated he would request repair if he came to the ED with a ruptured AAA.  His oncologist thinks he will be alive over the next 6-12 months.  Past Medical History  Diagnosis Date  . BENIGN PROSTATIC HYPERTROPHY 07/23/2008  . 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 HIP PAIN, LEFT 07/04/2007  . HYPERLIPIDEMIA 01/02/2007  . HYPERTENSION 01/02/2007  . INSOMNIA 11/16/2008  . PROSTATE SPECIFIC ANTIGEN, ELEVATED 09/10/2008  . Lung cancer     metastatic non small cell lung cancer, adenocarcinoma   . AAA (abdominal aortic aneurysm)     Past Surgical History  Procedure Laterality Date  . Transurethral resection of prostate  2004  . Total hip arthroplasty  2010, 2011    2010- left; 2011- right  . Upper gastrointestinal endoscopy      History   Social History  . Marital Status: Widowed    Spouse Name: N/A    Number of Children: N/A  . Years of Education: N/A   Occupational History  . Retired Teacher, early years/pre    Social History Main Topics  . Smoking status: Former Smoker -- 1.00 packs/day for 40 years    Types: Cigarettes    Quit date: 06/18/1998  . Smokeless tobacco: Never Used  . Alcohol Use: No  . Drug Use: No  . Sexual Activity: No   Other Topics Concern  . Not on file   Social History Narrative  . No narrative on file   Family History  Problem Relation Age of Onset  . Colon polyps Neg Hx   . Stomach cancer Neg Hx   . Colon cancer Neg Hx   . Cancer Neg Hx    Current Outpatient Prescriptions on File Prior to Visit  Medication Sig Dispense Refill  . cholecalciferol (VITAMIN D) 1000 UNITS tablet Take 1,000 Units by mouth every morning.       . clotrimazole (MYCELEX) 10 MG  troche       . dexamethasone (DECADRON) 4 MG tablet Take 4 mg by mouth 2 (two) times daily with a meal. Take while on Chemo      . docusate sodium (COLACE) 250 MG capsule Take 1 capsule (250 mg total) by mouth daily.  10 capsule  0  . folic acid (FOLVITE) 1 MG tablet Take 1 mg by mouth every morning.      Marland Kitchen HYDROcodone-homatropine (HYCODAN) 5-1.5 MG/5ML syrup Take 5 mLs by mouth every 6 (six) hours as needed for cough.  240 mL  0  . mirtazapine (REMERON) 15 MG tablet Take 1 tablet (15 mg total) by mouth at bedtime.  30 tablet  1  . multivitamin-iron-minerals-folic acid (CENTRUM) chewable tablet Chew 1 tablet by mouth every morning.       . prochlorperazine (COMPAZINE) 10 MG tablet Take 10 mg by mouth every 6 (six) hours as needed (nausea).       . senna (SENOKOT) 8.6 MG tablet Take 1 tablet by mouth daily as needed for constipation.      . tamsulosin (FLOMAX) 0.4 MG CAPS capsule       . amoxicillin (AMOXIL) 875 MG tablet       . citalopram (CELEXA) 10 MG tablet       .  diltiazem (CARDIZEM) 30 MG tablet Take 1 tablet (30 mg total) by mouth 4 (four) times daily as needed (for irregular heart rate  greater than 120.).  30 tablet  0  . diltiazem (DILACOR XR) 240 MG 24 hr capsule       . feeding supplement (RESOURCE BREEZE) LIQD Take 1 Container by mouth daily.    0  . fluconazole (DIFLUCAN) 200 MG tablet       . guaiFENesin-codeine (ROBITUSSIN AC) 100-10 MG/5ML syrup Take 1-2 tsp up to every 6 hours if needed for cough.  120 mL  0  . levofloxacin (LEVAQUIN) 750 MG tablet       . lidocaine (XYLOCAINE) 2 % solution       . LORazepam (ATIVAN) 1 MG tablet       . omeprazole (PRILOSEC) 20 MG capsule       . predniSONE (DELTASONE) 10 MG tablet       . sucralfate (CARAFATE) 1 G tablet       . traMADol (ULTRAM) 50 MG tablet        No current facility-administered medications on file prior to visit.    Allergies  Allergen Reactions  . Vicodin [Hydrocodone-Acetaminophen] Other (See Comments)     hallucinations   REVIEW OF SYSTEMS: (Positives checked otherwise negative)  CARDIOVASCULAR: []  chest pain, []  chest pressure, []  palpitations, []  shortness of breath when laying flat, [x]  shortness of breath with exertion, [x]  pain in feet when walking, []  pain in feet when laying flat, []  history of blood clot in veins (DVT), []  history of phlebitis, []  swelling in legs, []  varicose veins  PULMONARY: [x]  productive cough, []  asthma, []  wheezing  NEUROLOGIC: [x]  weakness in arms or legs, []  numbness in arms or legs, [x]  difficulty speaking or slurred speech, []  temporary loss of vision in one eye, [x]  dizziness  HEMATOLOGIC: []  bleeding problems, []  problems with blood clotting too easily  MUSCULOSKEL: []  joint pain, []  joint swelling  GASTROINTEST: []  vomiting blood, []  blood in stool  GENITOURINARY: []  burning with urination, []  blood in urine  PSYCHIATRIC: []  history of major depression  INTEGUMENTARY: []  rashes, []  ulcers  CONSTITUTIONAL: []  fever, []  chills   For VQI Use Only  PRE-ADM LIVING: Home  AMB STATUS: Ambulatory with Assistance  CAD Sx: None  PRIOR CHF: None  STRESS TEST: [x]  No, [ ]  Normal, [ ]  + ischemia, [ ]  + MI, [ ]  Both   Physical Examination  Filed Vitals:   04/03/13 1358  BP: 127/87  Pulse: 83  Height: 5\' 8"  (1.727 m)  Weight: 161 lb (73.029 kg)   Body mass index is 24.49 kg/(m^2).  General: A&O x 3, WD, elderly, Cachectic , weak appearing   Pulmonary: Sym exp, good air movt, CTAB, no rales, rhonchi, & wheezing   Cardiac: RRR, faint HS, no Murmurs, rubs or gallops   Vascular:  Vessel  Right  Left   Radial  Palpable  Palpable   Brachial  Palpable  Palpable   Carotid  Palpable, without bruit  Palpable, without bruit   Aorta  Prominently palpable  N/A   Femoral  Palpable  Palpable   Popliteal  Not palpable  Not palpable   PT  Not Palpable  Palpable   DP  Not Palpable  Palpable    Gastrointestinal: soft, NTND, -G/R, - HSM, - masses, - CVAT B,  umbilical hernia, and large left inguinal hernia   Musculoskeletal: M/S 5/5 throughout , Extremities without ischemic changes  Neurologic: Pain and light touch intact in extremities , Motor exam as listed above   Medical Decision Making  The patient is a 79 y.o. male who presents with: large asymptomatic AAA , Stage IV lung cancer, multiple co-morbiities   At the patient is going to request repair in the setting of acute ruptured AAA, the risk-benefit issues are irrelevant as he will be offered repair in anyway in an emergent setting, so it make more sense to go ahead in an elective fashion.  At a his size of aneurysm, his annual rupture rate is 30-40% minimally, so if his life expectancy is > 6-12 months, there is a real chance of rupture occuring.  The problem may be that his right iliac system might prevent placement of a the right iliac extension limb, so first an aortogram via right femoral approach is recommended.  If an Amplatz wire can traverse the right iliac system, it is reasonable to attempt the EVAR, knowing the patient still likely to died but more likely due to his lung cancer and multiple co-morbidities.  I have scheduled his aortogram for this coming Thursday, 23 OCT 14.    I discussed with the patient the nature of angiographic procedures, especially the limited patencies of any endovascular intervention.  The patient is aware of that the risks of an angiographic procedure include but are not limited to: bleeding, infection, access site complications, renal failure, embolization, rupture of vessel, dissection, possible need for emergent surgical intervention, possible need for surgical procedures to treat the patient's pathology, anaphylactic reaction to contrast, and stroke and death.    The patient is aware of the risks and agrees to proceed.  Thank you for allowing [place] to participate in this patient's care.  Leonides Sake, MD [facility]  [place] Office: [phone] Pager: [phone]  04/03/2013, 5:41 PM

## 2013-04-10 ENCOUNTER — Telehealth: Payer: Self-pay | Admitting: Pulmonary Disease

## 2013-04-10 NOTE — Telephone Encounter (Signed)
Will forward to BQ to make him aware.   

## 2013-04-10 NOTE — Telephone Encounter (Signed)
Pt states not need to call back-just to let BQ know.  Tyler [name]

## 2013-04-10 NOTE — Progress Notes (Signed)
Anesthesia Chart Review:  Patient is a 79 year old male scheduled for EVAR of AAA on 04/14/13 by Dr. Imogene Burn.  History includes former smoker, stage IV lung cancer with chemoradiation, HTN, 7.9cm AAA, paroxsymal afib 02/2013 (seen in ED by Dr. Patty Sermons), COPD, HLD, BPH s/p TURP, ED, anemia, GERD, bilateral THA. PCP is listed as Dr. Shirlean Mylar.  HEM-ONC is Dr. Arbutus Ped. He is scheduled to be seen for pulmonary clearance by Dr. Jannette Fogo on 04/13/13.  He is a Teacher, early years/pre.  He is a friend of anesthesiologist Dr. Michelle Piper who is aware of plans for surgery and history including recent episode of PAF treated with Cardizem. (Dr. Patty Sermons did not prescribe daily Cardizem due to hypotension, rather PRN for irregular HR > 120 bpm.)  It is anticipated that his assigned anesthesiologist will be Dr. Krista Blue. Procedure may be done with regional anesthesia.  EKG on 03/16/13 showed SR, old posterior infarct.  Echo on 03/16/13 showed: - Left ventricle: The cavity size was normal. There was focal basal, mild concentric, and moderate asymmetric hypertrophy. Systolic function was normal. The estimated ejection fraction was in the range of 50% to 55%. - Aortic valve: Moderate thickening and calcification. Valve area: 1.55cm^2(VTI). Valve area: 1.32cm^2 (Vmax). - Mitral valve: Calcified annulus. - Pulmonary arteries: PA peak pressure: 32mm Hg (S). - Pericardium, extracardiac: A small, free-flowing pericardial effusion was identified circumferential to the heart. The fluid had no internal echoes.There was no evidence of hemodynamic compromise. There was mildright atrial chamber collapse.  PFTs on 12/08/12 showed an FVC 1.70 (41%), FEV1 0.94 (30%), FEF25-75% 0.29 (11%).  Preoperative labs (post a-gram) noted.  H/H 8.9/26.1.  Cr 1.18.  PT/INR WNL.  PTT 51.  ABG: pH 7.488, pCO2 34.5, pO2 73.  He is for a chest xray and T&S on the day of surgery. Will also plan of repeating PTT and get an ISTAT8.   Velna [facility] Short  Stay Center/Anesthesiology Phone [phone] 04/10/2013 12:34 PM

## 2013-04-13 ENCOUNTER — Encounter (HOSPITAL_COMMUNITY): Payer: Medicare Other

## 2013-04-13 ENCOUNTER — Ambulatory Visit (INDEPENDENT_AMBULATORY_CARE_PROVIDER_SITE_OTHER): Payer: Medicare Other | Admitting: Pulmonary Disease

## 2013-04-13 ENCOUNTER — Ambulatory Visit: Payer: Medicare Other | Admitting: Pulmonary Disease

## 2013-04-13 ENCOUNTER — Encounter: Payer: Self-pay | Admitting: Pulmonary Disease

## 2013-04-13 VITALS — BP 92/60 | HR 85 | Temp 98.2°F | Ht 68.0 in | Wt 164.4 lb

## 2013-04-13 DIAGNOSIS — J449 Chronic obstructive pulmonary disease, unspecified: Secondary | ICD-10-CM

## 2013-04-13 MED ORDER — DEXTROSE 5 % IV SOLN
1.5000 g | INTRAVENOUS | Status: AC
  Administered 2013-04-14: 1.5 g via INTRAVENOUS
  Filled 2013-04-13: qty 1.5

## 2013-04-13 MED ORDER — IPRATROPIUM-ALBUTEROL 20-100 MCG/ACT IN AERS: 1.0000 | INHALATION_SPRAY | Freq: Four times a day (QID) | RESPIRATORY_TRACT | Status: DC | PRN

## 2013-04-13 NOTE — Assessment & Plan Note (Signed)
This is as good as I have seen Tyler [name] since his diagnosis of COPD and lung cancer.  He has severe airflow obstruction based on the 11/2012 simple spirometry test, but clinically he is well today.  His risk for a perioperative pulmonary complication during tomorrow's planned endovascular procedure is low.  However, if he is to have an open procedure his risk would be moderate.  That said, his COPD should not prevent him from having the procedure.  I have given him a printed copy of his spirometry test showing his diagnosis of COPD.  Plan: -start Combivent respimat tid to qid -for procedure tomorrow> q6h and q2h prn albuterol, out of bed as soon as possible, incentive spirometry -f/u with me in 2-3 months

## 2013-04-13 NOTE — Patient Instructions (Signed)
Use the combivent one puff every 6 hours as needed for shortness of breath Be sure to use the incentive spirometer and get out of bed as soon as the doctors and nurses say that it is OK after surgery tomorrow  We will see you back in 2-3 months or sooner if needed

## 2013-04-13 NOTE — Progress Notes (Signed)
Subjective:    Patient ID: Tyler [name], male    DOB: [birth year], 79 y.o.   MRN: [id number]  Synopsis: Mr. Woodfield first saw the [place] pulmonary office in June of 2014. On that visit he was diagnosed with moderate COPD as well as stage IV non-small cell lung cancer. He started radiation therapy for his lung cancer in July 2014 and is due to start chemotherapy early August 2014.  HPI   01/19/2013 routine office visit>>  Tyrrell has been struggling with cough since the last visit. He feels that radiation therapy has been making this worse. He has frequent secretions throughout the day and the constant sensation that he needs to clear his throat. This is worse when he lies down at night. It is worse after radiation therapy. He is coughing spells frequently which are quite bothersome. Shortness of breath is about the same since the last visit. In general, he feels much weaker and much more fatigue since the last visit. He sleeps frequently during the day. He continues to take the Spiriva daily.  04/13/2013 ROV > Samul is having a lot of fatigue and states that he sleep s a lot.  He still gets out and walks with [name] occasionally.  It has increased some.  He feels that the cough syrup with codeine is helping with his cough quite a bit.   He is still having the phlegm in his throat ayet better than before.  His appetite is improved.  He was hospitalized for afib recently.  His reflux is better.  His last radiation treaetment was two months ago.  Past Medical History  Diagnosis Date  . BENIGN PROSTATIC HYPERTROPHY 07/23/2008  . 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 HIP PAIN, LEFT 07/04/2007  . HYPERLIPIDEMIA 01/02/2007  . HYPERTENSION 01/02/2007  . INSOMNIA 11/16/2008  . PROSTATE SPECIFIC ANTIGEN, ELEVATED 09/10/2008  . Lung cancer     metastatic non small cell lung cancer, adenocarcinoma   . AAA (abdominal aortic aneurysm)   . Dysrhythmia     afib  . Shortness of breath   . COPD (chronic obstructive  pulmonary disease)   . GERD (gastroesophageal reflux disease)   . Anemia   . History of blood transfusion      Review of Systems  Constitutional: Positive for fatigue. Negative for fever and chills.  HENT: Negative for congestion, nosebleeds, postnasal drip and rhinorrhea.   Respiratory: Positive for shortness of breath. Negative for cough and wheezing.   Cardiovascular: Negative for chest pain and leg swelling.       Objective:   Physical Exam   Filed Vitals:   04/13/13 0935  BP: 92/60  Pulse: 85  Temp: 98.2 F (36.8 C)  TempSrc: Oral  Height: 5\' 8"  (1.727 m)  Weight: 164 lb 6.4 oz (74.571 kg)  SpO2: 99%   RA  Gen: chronically ill appearing, no acute distress HEENT: NCAT, EOMi, OP clear,  PULM: diminished bases, no wheezing, moderately decreased air flow bilaterally CV: RRR, no mgr, no JVD AB: BS+, soft, nontender, no hsm Ext: warm, no edema, no clubbing, no cyanosis  12/08/2012 simple spirometry ratio 55%, FEV1 0.94 L (30% predicted)     Assessment & Plan:   COPD, moderate This is as good as I have seen Alvey since his diagnosis of COPD and lung cancer.  He has severe airflow obstruction based on the 11/2012 simple spirometry test, but clinically he is well today.  His risk for a perioperative pulmonary complication during tomorrow's planned endovascular  procedure is low.  However, if he is to have an open procedure his risk would be moderate.  That said, his COPD should not prevent him from having the procedure.  I have given him a printed copy of his spirometry test showing his diagnosis of COPD.  Plan: -start Combivent respimat tid to qid -for procedure tomorrow> q6h and q2h prn albuterol, out of bed as soon as possible, incentive spirometry -f/u with me in 2-3 months    Updated Medication List Outpatient Encounter Prescriptions as of 04/13/2013  Medication Sig Dispense Refill  . cholecalciferol (VITAMIN D) 1000 UNITS tablet Take 1,000 Units by mouth  every morning.       Marland Kitchen dexamethasone (DECADRON) 4 MG tablet Take 4 mg by mouth 2 (two) times daily with a meal. Take while on Chemo      . folic acid (FOLVITE) 1 MG tablet Take 1 mg by mouth every morning.      Marland Kitchen HYDROcodone-homatropine (HYCODAN) 5-1.5 MG/5ML syrup Take 5 mLs by mouth every 6 (six) hours as needed for cough.  240 mL  0  . mirtazapine (REMERON) 15 MG tablet Take 1 tablet (15 mg total) by mouth at bedtime.  30 tablet  1  . multivitamin-iron-minerals-folic acid (CENTRUM) chewable tablet Chew 1 tablet by mouth every morning.       . polyethylene glycol powder (GLYCOLAX/MIRALAX) powder Take 17 g by mouth daily as needed (for constipation).      . propranolol (INDERAL) 10 MG tablet Take 1 tablet by mouth 2 (two) times daily.       No facility-administered encounter medications on file as of 04/13/2013.

## 2013-04-14 ENCOUNTER — Inpatient Hospital Stay (HOSPITAL_COMMUNITY): Payer: Medicare Other

## 2013-04-14 ENCOUNTER — Encounter (HOSPITAL_COMMUNITY)
Admission: RE | Disposition: A | Discharge status: Home / Self Care | Payer: Self-pay | Source: Ambulatory Visit | Attending: Vascular Surgery

## 2013-04-14 ENCOUNTER — Other Ambulatory Visit: Payer: Self-pay | Admitting: *Deleted

## 2013-04-14 ENCOUNTER — Encounter (HOSPITAL_COMMUNITY): Payer: Self-pay | Admitting: Anesthesiology

## 2013-04-14 ENCOUNTER — Encounter (HOSPITAL_COMMUNITY): Payer: Medicare Other | Admitting: Anesthesiology

## 2013-04-14 ENCOUNTER — Inpatient Hospital Stay (HOSPITAL_COMMUNITY): Payer: Medicare Other | Admitting: Anesthesiology

## 2013-04-14 ENCOUNTER — Inpatient Hospital Stay (HOSPITAL_COMMUNITY)
Admission: RE | Admit: 2013-04-14 | Discharge: 2013-04-15 | DRG: 237 | Disposition: A | Discharge status: Home / Self Care | Payer: Medicare Other | Source: Ambulatory Visit | Attending: Vascular Surgery | Admitting: Vascular Surgery

## 2013-04-14 DIAGNOSIS — N400 Enlarged prostate without lower urinary tract symptoms: Secondary | ICD-10-CM | POA: Diagnosis present

## 2013-04-14 DIAGNOSIS — Z923 Personal history of irradiation: Secondary | ICD-10-CM

## 2013-04-14 DIAGNOSIS — C439 Malignant melanoma of skin, unspecified: Secondary | ICD-10-CM | POA: Diagnosis present

## 2013-04-14 DIAGNOSIS — K219 Gastro-esophageal reflux disease without esophagitis: Secondary | ICD-10-CM | POA: Diagnosis present

## 2013-04-14 DIAGNOSIS — Z79899 Other long term (current) drug therapy: Secondary | ICD-10-CM

## 2013-04-14 DIAGNOSIS — I714 Abdominal aortic aneurysm, without rupture, unspecified: Secondary | ICD-10-CM

## 2013-04-14 DIAGNOSIS — Z87891 Personal history of nicotine dependence: Secondary | ICD-10-CM

## 2013-04-14 DIAGNOSIS — I129 Hypertensive chronic kidney disease with stage 1 through stage 4 chronic kidney disease, or unspecified chronic kidney disease: Secondary | ICD-10-CM | POA: Diagnosis present

## 2013-04-14 DIAGNOSIS — IMO0002 Reserved for concepts with insufficient information to code with codable children: Secondary | ICD-10-CM

## 2013-04-14 DIAGNOSIS — Z48812 Encounter for surgical aftercare following surgery on the circulatory system: Secondary | ICD-10-CM

## 2013-04-14 DIAGNOSIS — D649 Anemia, unspecified: Secondary | ICD-10-CM | POA: Diagnosis present

## 2013-04-14 DIAGNOSIS — G4700 Insomnia, unspecified: Secondary | ICD-10-CM | POA: Diagnosis present

## 2013-04-14 DIAGNOSIS — C3490 Malignant neoplasm of unspecified part of unspecified bronchus or lung: Secondary | ICD-10-CM | POA: Diagnosis present

## 2013-04-14 DIAGNOSIS — E43 Unspecified severe protein-calorie malnutrition: Secondary | ICD-10-CM | POA: Diagnosis present

## 2013-04-14 DIAGNOSIS — E785 Hyperlipidemia, unspecified: Secondary | ICD-10-CM | POA: Diagnosis present

## 2013-04-14 DIAGNOSIS — J449 Chronic obstructive pulmonary disease, unspecified: Secondary | ICD-10-CM | POA: Diagnosis present

## 2013-04-14 DIAGNOSIS — Z9221 Personal history of antineoplastic chemotherapy: Secondary | ICD-10-CM

## 2013-04-14 DIAGNOSIS — I4891 Unspecified atrial fibrillation: Secondary | ICD-10-CM | POA: Diagnosis present

## 2013-04-14 DIAGNOSIS — Z6827 Body mass index (BMI) 27.0-27.9, adult: Secondary | ICD-10-CM

## 2013-04-14 DIAGNOSIS — J4489 Other specified chronic obstructive pulmonary disease: Secondary | ICD-10-CM | POA: Diagnosis present

## 2013-04-14 DIAGNOSIS — N182 Chronic kidney disease, stage 2 (mild): Secondary | ICD-10-CM | POA: Diagnosis present

## 2013-04-14 DIAGNOSIS — Z96649 Presence of unspecified artificial hip joint: Secondary | ICD-10-CM

## 2013-04-14 HISTORY — DX: Gastro-esophageal reflux disease without esophagitis: K21.9

## 2013-04-14 HISTORY — DX: Cardiac arrhythmia, unspecified: I49.9

## 2013-04-14 HISTORY — DX: Shortness of breath: R06.02

## 2013-04-14 HISTORY — DX: Personal history of other medical treatment: Z92.89

## 2013-04-14 HISTORY — PX: ABDOMINAL AORTIC ENDOVASCULAR STENT GRAFT: SHX5707

## 2013-04-14 HISTORY — DX: Chronic obstructive pulmonary disease, unspecified: J44.9

## 2013-04-14 HISTORY — DX: Anemia, unspecified: D64.9

## 2013-04-14 LAB — PROTIME-INR: INR: 1.14 (ref 0.00–1.49)

## 2013-04-14 LAB — PREPARE RBC (CROSSMATCH)

## 2013-04-14 LAB — MAGNESIUM: Magnesium: 1.6 mg/dL (ref 1.5–2.5)

## 2013-04-14 LAB — CBC
HCT: 27.5 % — ABNORMAL LOW (ref 39.0–52.0)
Hemoglobin: 9.2 g/dL — ABNORMAL LOW (ref 13.0–17.0)
MCH: 29.3 pg (ref 26.0–34.0)
MCV: 87.6 fL (ref 78.0–100.0)
RBC: 3.14 MIL/uL — ABNORMAL LOW (ref 4.22–5.81)
RDW: 16.8 % — ABNORMAL HIGH (ref 11.5–15.5)

## 2013-04-14 LAB — POCT I-STAT, CHEM 8
BUN: 24 mg/dL — ABNORMAL HIGH (ref 6–23)
Creatinine, Ser: 1.6 mg/dL — ABNORMAL HIGH (ref 0.50–1.35)
HCT: 27 % — ABNORMAL LOW (ref 39.0–52.0)
Sodium: 132 mEq/L — ABNORMAL LOW (ref 135–145)
TCO2: 25 mmol/L (ref 0–100)

## 2013-04-14 LAB — POCT I-STAT 4, (NA,K, GLUC, HGB,HCT)
HCT: 18 % — ABNORMAL LOW (ref 39.0–52.0)
Hemoglobin: 6.1 g/dL — CL (ref 13.0–17.0)
Potassium: 4.3 mEq/L (ref 3.5–5.1)
Sodium: 131 mEq/L — ABNORMAL LOW (ref 135–145)

## 2013-04-14 LAB — BASIC METABOLIC PANEL
BUN: 22 mg/dL (ref 6–23)
CO2: 23 mEq/L (ref 19–32)
Calcium: 8.4 mg/dL (ref 8.4–10.5)
Creatinine, Ser: 1.3 mg/dL (ref 0.50–1.35)
GFR calc non Af Amer: 51 mL/min — ABNORMAL LOW (ref >90)
Glucose, Bld: 117 mg/dL — ABNORMAL HIGH (ref 70–99)

## 2013-04-14 LAB — ABO/RH: ABO/RH(D): O NEG

## 2013-04-14 SURGERY — INSERTION, ENDOVASCULAR STENT GRAFT, AORTA, ABDOMINAL
Anesthesia: General | Site: Abdomen | Wound class: Clean

## 2013-04-14 MED ORDER — MIRTAZAPINE 15 MG PO TABS
15.0000 mg | ORAL_TABLET | Freq: Every day | ORAL | Status: DC
  Filled 2013-04-14: qty 1

## 2013-04-14 MED ORDER — DEXTROSE 5 % IV SOLN
1.5000 g | Freq: Two times a day (BID) | INTRAVENOUS | Status: AC
  Administered 2013-04-14 – 2013-04-15 (×2): 1.5 g via INTRAVENOUS
  Filled 2013-04-14 (×2): qty 1.5

## 2013-04-14 MED ORDER — METOPROLOL TARTRATE 1 MG/ML IV SOLN: 2.0000 mg | INTRAVENOUS | Status: DC | PRN

## 2013-04-14 MED ORDER — HYDRALAZINE HCL 20 MG/ML IJ SOLN
INTRAMUSCULAR | Status: AC
  Filled 2013-04-14: qty 1

## 2013-04-14 MED ORDER — GLYCOPYRROLATE 0.2 MG/ML IJ SOLN
INTRAMUSCULAR | Status: DC | PRN
  Administered 2013-04-14: 0.2 mg via INTRAVENOUS

## 2013-04-14 MED ORDER — ALBUMIN HUMAN 5 % IV SOLN
INTRAVENOUS | Status: DC | PRN
  Administered 2013-04-14: 08:00:00 via INTRAVENOUS

## 2013-04-14 MED ORDER — PANTOPRAZOLE SODIUM 40 MG PO TBEC
40.0000 mg | DELAYED_RELEASE_TABLET | Freq: Every day | ORAL | Status: DC
  Administered 2013-04-15: 40 mg via ORAL
  Filled 2013-04-14: qty 1

## 2013-04-14 MED ORDER — FENTANYL CITRATE 0.05 MG/ML IJ SOLN
INTRAMUSCULAR | Status: DC | PRN
  Administered 2013-04-14 (×3): 50 ug via INTRAVENOUS

## 2013-04-14 MED ORDER — SODIUM CHLORIDE 0.9 % IR SOLN
Status: DC | PRN
  Administered 2013-04-14: 09:00:00

## 2013-04-14 MED ORDER — ROCURONIUM BROMIDE 100 MG/10ML IV SOLN
INTRAVENOUS | Status: DC | PRN
  Administered 2013-04-14: 40 mg via INTRAVENOUS
  Administered 2013-04-14: 10 mg via INTRAVENOUS

## 2013-04-14 MED ORDER — ARTIFICIAL TEARS OP OINT
TOPICAL_OINTMENT | OPHTHALMIC | Status: DC | PRN
  Administered 2013-04-14: 1 via OPHTHALMIC

## 2013-04-14 MED ORDER — GUAIFENESIN-DM 100-10 MG/5ML PO SYRP: 15.0000 mL | ORAL_SOLUTION | ORAL | Status: DC | PRN

## 2013-04-14 MED ORDER — DEXTROSE-NACL 5-0.9 % IV SOLN
INTRAVENOUS | Status: DC
  Administered 2013-04-14: 100 mL/h via INTRAVENOUS
  Administered 2013-04-15: via INTRAVENOUS

## 2013-04-14 MED ORDER — 0.9 % SODIUM CHLORIDE (POUR BTL) OPTIME
TOPICAL | Status: DC | PRN
  Administered 2013-04-14: 1000 mL

## 2013-04-14 MED ORDER — PHENYLEPHRINE HCL 10 MG/ML IJ SOLN
10.0000 mg | INTRAVENOUS | Status: DC | PRN
  Administered 2013-04-14: 15 ug/min via INTRAVENOUS

## 2013-04-14 MED ORDER — OXYCODONE-ACETAMINOPHEN 5-325 MG PO TABS: 1.0000 | ORAL_TABLET | ORAL | Status: DC | PRN

## 2013-04-14 MED ORDER — POLYETHYLENE GLYCOL 3350 17 GM/SCOOP PO POWD
17.0000 g | Freq: Every day | ORAL | Status: DC | PRN
  Filled 2013-04-14: qty 255

## 2013-04-14 MED ORDER — PROPRANOLOL HCL 10 MG PO TABS
10.0000 mg | ORAL_TABLET | Freq: Once | ORAL | Status: AC
  Administered 2013-04-14: 10 mg via ORAL
  Filled 2013-04-14: qty 1

## 2013-04-14 MED ORDER — LIDOCAINE HCL (CARDIAC) 20 MG/ML IV SOLN
INTRAVENOUS | Status: DC | PRN
  Administered 2013-04-14: 60 mg via INTRAVENOUS

## 2013-04-14 MED ORDER — IPRATROPIUM-ALBUTEROL 20-100 MCG/ACT IN AERS
1.0000 | INHALATION_SPRAY | Freq: Four times a day (QID) | RESPIRATORY_TRACT | Status: DC | PRN
  Filled 2013-04-14: qty 4

## 2013-04-14 MED ORDER — SODIUM CHLORIDE 0.9 % IV SOLN: INTRAVENOUS | Status: DC

## 2013-04-14 MED ORDER — HYDRALAZINE HCL 20 MG/ML IJ SOLN: 10.0000 mg | INTRAMUSCULAR | Status: DC | PRN

## 2013-04-14 MED ORDER — POTASSIUM CHLORIDE CRYS ER 20 MEQ PO TBCR: 20.0000 meq | EXTENDED_RELEASE_TABLET | Freq: Every day | ORAL | Status: DC | PRN

## 2013-04-14 MED ORDER — PROTAMINE SULFATE 10 MG/ML IV SOLN
INTRAVENOUS | Status: DC | PRN
  Administered 2013-04-14: 30 mg via INTRAVENOUS

## 2013-04-14 MED ORDER — PROPOFOL 10 MG/ML IV BOLUS
INTRAVENOUS | Status: DC | PRN
  Administered 2013-04-14: 100 mg via INTRAVENOUS

## 2013-04-14 MED ORDER — SODIUM CHLORIDE 0.9 % IV SOLN: 500.0000 mL | Freq: Once | INTRAVENOUS | Status: AC | PRN

## 2013-04-14 MED ORDER — THROMBIN 20000 UNITS EX SOLR
CUTANEOUS | Status: AC
  Filled 2013-04-14: qty 20000

## 2013-04-14 MED ORDER — PROMETHAZINE HCL 25 MG/ML IJ SOLN: 6.2500 mg | INTRAMUSCULAR | Status: DC | PRN

## 2013-04-14 MED ORDER — ONDANSETRON HCL 4 MG/2ML IJ SOLN
INTRAMUSCULAR | Status: DC | PRN
  Administered 2013-04-14: 4 mg via INTRAVENOUS

## 2013-04-14 MED ORDER — DOCUSATE SODIUM 100 MG PO CAPS
100.0000 mg | ORAL_CAPSULE | Freq: Every day | ORAL | Status: DC
  Administered 2013-04-15: 100 mg via ORAL

## 2013-04-14 MED ORDER — HEPARIN SODIUM (PORCINE) 1000 UNIT/ML IJ SOLN
INTRAMUSCULAR | Status: DC | PRN
  Administered 2013-04-14: 6500 [IU] via INTRAVENOUS

## 2013-04-14 MED ORDER — THROMBIN 20000 UNITS EX SOLR
CUTANEOUS | Status: DC | PRN
  Administered 2013-04-14: 10:00:00 via TOPICAL

## 2013-04-14 MED ORDER — ONDANSETRON HCL 4 MG/2ML IJ SOLN: 4.0000 mg | Freq: Four times a day (QID) | INTRAMUSCULAR | Status: DC | PRN

## 2013-04-14 MED ORDER — PROPRANOLOL HCL 10 MG PO TABS
10.0000 mg | ORAL_TABLET | Freq: Two times a day (BID) | ORAL | Status: DC
  Administered 2013-04-14 – 2013-04-15 (×2): 10 mg via ORAL
  Filled 2013-04-14 (×3): qty 1

## 2013-04-14 MED ORDER — MORPHINE SULFATE 2 MG/ML IJ SOLN
2.0000 mg | INTRAMUSCULAR | Status: DC | PRN
  Filled 2013-04-14: qty 1

## 2013-04-14 MED ORDER — FOLIC ACID 1 MG PO TABS
1.0000 mg | ORAL_TABLET | Freq: Every morning | ORAL | Status: DC
  Administered 2013-04-15: 1 mg via ORAL
  Filled 2013-04-14: qty 1

## 2013-04-14 MED ORDER — IODIXANOL 320 MG/ML IV SOLN
INTRAVENOUS | Status: DC | PRN
  Administered 2013-04-14: 150 mL via INTRA_ARTERIAL
  Administered 2013-04-14: 50 mL via INTRA_ARTERIAL

## 2013-04-14 MED ORDER — MAGNESIUM SULFATE 40 MG/ML IJ SOLN
2.0000 g | Freq: Once | INTRAMUSCULAR | Status: AC | PRN
  Filled 2013-04-14: qty 50

## 2013-04-14 MED ORDER — NEOSTIGMINE METHYLSULFATE 1 MG/ML IJ SOLN
INTRAMUSCULAR | Status: DC | PRN
  Administered 2013-04-14: 1 mg via INTRAVENOUS

## 2013-04-14 MED ORDER — ACETAMINOPHEN 325 MG PO TABS
650.0000 mg | ORAL_TABLET | ORAL | Status: DC | PRN
  Administered 2013-04-14 – 2013-04-15 (×3): 650 mg via ORAL
  Filled 2013-04-14 (×2): qty 2

## 2013-04-14 MED ORDER — LACTATED RINGERS IV SOLN
INTRAVENOUS | Status: DC | PRN
  Administered 2013-04-14 (×2): via INTRAVENOUS

## 2013-04-14 MED ORDER — BISACODYL 10 MG RE SUPP: 10.0000 mg | Freq: Every day | RECTAL | Status: DC | PRN

## 2013-04-14 MED ORDER — PHENOL 1.4 % MT LIQD: 1.0000 | OROMUCOSAL | Status: DC | PRN

## 2013-04-14 MED ORDER — LABETALOL HCL 5 MG/ML IV SOLN: 10.0000 mg | INTRAVENOUS | Status: DC | PRN

## 2013-04-14 MED ORDER — HYDROMORPHONE HCL PF 1 MG/ML IJ SOLN: 0.2500 mg | INTRAMUSCULAR | Status: DC | PRN

## 2013-04-14 SURGICAL SUPPLY — 84 items
ADH SKN CLS APL DERMABOND .7 (GAUZE/BANDAGES/DRESSINGS) ×2
BAG BANDED W/RUBBER/TAPE 36X54 (MISCELLANEOUS) ×2 IMPLANT
BAG EQP BAND 135X91 W/RBR TAPE (MISCELLANEOUS) ×1
BAG SNAP BAND KOVER 36X36 (MISCELLANEOUS) ×2 IMPLANT
BLADE SURG ROTATE 9660 (MISCELLANEOUS) ×1 IMPLANT
CANISTER SUCTION 2500CC (MISCELLANEOUS) ×2 IMPLANT
CATH BEACON 5.038 65CM KMP-01 (CATHETERS) ×1 IMPLANT
CATH OMNI FLUSH .035X70CM (CATHETERS) ×1 IMPLANT
CLIP TI MEDIUM 24 (CLIP) IMPLANT
CLIP TI WIDE RED SMALL 24 (CLIP) IMPLANT
COVER DOME SNAP 22 D (MISCELLANEOUS) ×2 IMPLANT
COVER MAYO STAND STRL (DRAPES) ×2 IMPLANT
COVER PROBE W GEL 5X96 (DRAPES) ×2 IMPLANT
COVER SURGICAL LIGHT HANDLE (MISCELLANEOUS) ×2 IMPLANT
DERMABOND ADVANCED (GAUZE/BANDAGES/DRESSINGS) ×2
DERMABOND ADVANCED .7 DNX12 (GAUZE/BANDAGES/DRESSINGS) ×1 IMPLANT
DEVICE CLOSURE PERCLS PRGLD 6F (VASCULAR PRODUCTS) IMPLANT
DEVICE TORQUE KENDALL .025-038 (MISCELLANEOUS) ×1 IMPLANT
DRAPE TABLE COVER HEAVY DUTY (DRAPES) ×2 IMPLANT
DRESSING OPSITE X SMALL 2X3 (GAUZE/BANDAGES/DRESSINGS) ×4 IMPLANT
DRYSEAL FLEXSHEATH 12FR 33CM (SHEATH) ×1
DRYSEAL FLEXSHEATH 18FR 33CM (SHEATH) ×1
ELECT CAUTERY BLADE 6.4 (BLADE) ×3 IMPLANT
ELECT REM PT RETURN 9FT ADLT (ELECTROSURGICAL) ×4
ELECTRODE REM PT RTRN 9FT ADLT (ELECTROSURGICAL) ×2 IMPLANT
EXCLUDER AORTIC EXTEND 32X4.5 (Vascular Products) ×1 IMPLANT
EXCLUDER TNK LEG 31MX14X17 (Endovascular Graft) IMPLANT
EXCLUDER TRUNK LEG 31MX14X17 (Endovascular Graft) ×2 IMPLANT
GAUZE SPONGE 2X2 8PLY STRL LF (GAUZE/BANDAGES/DRESSINGS) ×1 IMPLANT
GAUZE SPONGE 4X4 16PLY XRAY LF (GAUZE/BANDAGES/DRESSINGS) ×1 IMPLANT
GLOVE BIO SURGEON STRL SZ 6.5 (GLOVE) ×1 IMPLANT
GLOVE BIO SURGEON STRL SZ7 (GLOVE) ×2 IMPLANT
GLOVE BIO SURGEON STRL SZ7.5 (GLOVE) ×2 IMPLANT
GLOVE BIOGEL PI IND STRL 6.5 (GLOVE) IMPLANT
GLOVE BIOGEL PI IND STRL 7.5 (GLOVE) ×1 IMPLANT
GLOVE BIOGEL PI IND STRL 8 (GLOVE) IMPLANT
GLOVE BIOGEL PI INDICATOR 6.5 (GLOVE) ×1
GLOVE BIOGEL PI INDICATOR 7.5 (GLOVE) ×1
GLOVE BIOGEL PI INDICATOR 8 (GLOVE) ×1
GLOVE SURG SS PI 7.0 STRL IVOR (GLOVE) ×2 IMPLANT
GOWN STRL NON-REIN LRG LVL3 (GOWN DISPOSABLE) ×6 IMPLANT
GOWN STRL REIN XL XLG (GOWN DISPOSABLE) ×2 IMPLANT
GRAFT BALLN CATH 65CM (STENTS) IMPLANT
GUIDEWIRE ANGLED .035X150CM (WIRE) ×1 IMPLANT
KIT BASIN OR (CUSTOM PROCEDURE TRAY) ×2 IMPLANT
KIT ROOM TURNOVER OR (KITS) ×2 IMPLANT
LEG CONTRALATERAL 18X11.5 (Endovascular Graft) ×1 IMPLANT
NDL PERC 18GX7CM (NEEDLE) ×1 IMPLANT
NEEDLE PERC 18GX7CM (NEEDLE) ×2 IMPLANT
NS IRRIG 1000ML POUR BTL (IV SOLUTION) ×2 IMPLANT
PACK AORTA (CUSTOM PROCEDURE TRAY) ×2 IMPLANT
PAD ARMBOARD 7.5X6 YLW CONV (MISCELLANEOUS) ×4 IMPLANT
PENCIL BUTTON HOLSTER BLD 10FT (ELECTRODE) ×1 IMPLANT
PERCLOSE PROGLIDE 6F (VASCULAR PRODUCTS)
PROTECTION STATION PRESSURIZED (MISCELLANEOUS) ×2
SHEATH AVANTI 11CM 8FR (MISCELLANEOUS) ×1 IMPLANT
SHEATH BRITE TIP 8FR 23CM (MISCELLANEOUS) ×1 IMPLANT
SHEATH DRYSEAL FLEX 12FR 33CM (SHEATH) IMPLANT
SHEATH DRYSEAL FLEX 18FR 33CM (SHEATH) IMPLANT
SPONGE GAUZE 2X2 STER 10/PKG (GAUZE/BANDAGES/DRESSINGS) ×2
STAPLER VISISTAT 35W (STAPLE) IMPLANT
STATION PROTECTION PRESSURIZED (MISCELLANEOUS) IMPLANT
STENT GRAFT BALLN CATH 65CM (STENTS) ×1
STOPCOCK MORSE 400PSI 3WAY (MISCELLANEOUS) ×2 IMPLANT
SUT ETHILON 3 0 PS 1 (SUTURE) IMPLANT
SUT MNCRL AB 4-0 PS2 18 (SUTURE) ×4 IMPLANT
SUT PROLENE 5 0 C 1 24 (SUTURE) ×4 IMPLANT
SUT VIC AB 2-0 CT1 27 (SUTURE) ×4
SUT VIC AB 2-0 CT1 TAPERPNT 27 (SUTURE) IMPLANT
SUT VIC AB 2-0 CTX 36 (SUTURE) IMPLANT
SUT VIC AB 3-0 SH 27 (SUTURE) ×4
SUT VIC AB 3-0 SH 27X BRD (SUTURE) IMPLANT
SYR 20CC LL (SYRINGE) ×4 IMPLANT
SYR 30ML LL (SYRINGE) IMPLANT
SYR MEDRAD MARK V 150ML (SYRINGE) ×2 IMPLANT
SYRINGE 10CC LL (SYRINGE) ×6 IMPLANT
TAPE CLOTH SURG 4X10 WHT LF (GAUZE/BANDAGES/DRESSINGS) ×1 IMPLANT
TOWEL OR 17X24 6PK STRL BLUE (TOWEL DISPOSABLE) ×4 IMPLANT
TOWEL OR 17X26 10 PK STRL BLUE (TOWEL DISPOSABLE) ×4 IMPLANT
TRAY FOLEY CATH 16FRSI W/METER (SET/KITS/TRAYS/PACK) ×2 IMPLANT
TUBING HIGH PRESSURE 120CM (CONNECTOR) ×2 IMPLANT
WATER STERILE IRR 1000ML POUR (IV SOLUTION) ×1 IMPLANT
WIRE AMPLATZ SS-J .035X180CM (WIRE) ×3 IMPLANT
WIRE BENTSON .035X145CM (WIRE) ×5 IMPLANT

## 2013-04-14 NOTE — Interval H&P Note (Signed)
[facility] [place]  History and Physical Update  The patient was interviewed and re-examined.  The patient's previous History and Physical has been reviewed and is unchanged except for: interval aortogram which confirms iliac access is possible on the right.  There is no change in the plan of care: EVAR.  The patient is aware the risks of endovascular aortic surgery include but are not limited to: bleeding, need for transfusion, infection, death, stroke, paralysis, wound complications, bowel ischemia, extended ventilation, anaphylactic reaction to contrast, contrast induced nephropathy, embolism, and need for additional procedure to address endoleaks.  Overall, I cited a minimal mortality rate of 1-2% and morbidity rate of 15%.  We have discussed that his mortality rate is likely >>2% due to his underlying multiple comorbidities.  Leonides Sake, MD [facility] [place] Office: [phone] Pager: [phone]  04/14/2013, 7:24 AM

## 2013-04-14 NOTE — Op Note (Signed)
OPERATIVE NOTE   PROCEDURE:  1. Bilateral common femoral artery exposure and repair 2. Placement of catheter in aorta x 2 3. Aortogram 4. Repair of aorta with modular bifurcated prosthesis with one limb (31mm x 14 mm x 17 cm) 5. Placement of left iliac limb (18 mm x 11.5 cm) 6. Placement of aortic cuff (32 mm x 4 cm) 7. Radiology S&I  PRE-OPERATIVE DIAGNOSIS: large abdominal aortic aneurysm   POST-OPERATIVE DIAGNOSIS: same as above   CO-SURGEONS: Leonides Sake, MD; Cari Caraway, MD   ANESTHESIA: general   ESTIMATED BLOOD LOSS: 50 cc   CONTRAST: 100 cc   FINDING(S):  1. No type I endoleak 2. Small left side type II endoleak 3. Bilateral renal arteries widely patent at end of case 4. Successful exclusion of abdominal aortic aneurysm  5. B dopplerable left dorsalis pedis at end of case  INDICATIONS:  Tyler [name] is a 79 y.o. male who present with large abdominal aortic aneurysm. The patient has significant co-morbidities including metastatic lung cancer and severe COPD.  After extensive pre-operative optimization, including discussion with Oncology and Pulmonology, the patient elected to proceed with an endovascular aortic repair. The patient family is aware the risks of endovascular aortic surgery include but are not limited to: bleeding, need for transfusion, infection, death, stroke, paralysis, wound complications, impotence, bowel ischemia, extended ventilation and need for secondary procedures. Overall, I cited an estimate of >5% mortality rate due to his multiple co-morbidities.   DESCRIPTION:  After full informed written consent was obtained from the patient's family, the patient was brought back to the operating room, and placed supine upon the operating table. He already had a A-line place. After obtaining adequate anesthesia, a Foley catheter was placed to monitor urine output in the operating room. He was prepped and draped in the standard fashion for an endovascular aneurysm  repair.  I made an oblique incision in the right groin and dissected out the right common femoral artery.  This artery was extremely calcified.  I had to dissect out the distal external iliac artery to find a segment amendable to clamping.  Distally, I dissected out the right superficial femoral artery and profunda femoral artery, both which were soft enough to clamp.  Dr. Edilia Bo then made an incision in the left groin and dissected out the left common femoral artery.    Then I found a soft spot in the right common femoral artery and cannulated it with a 18 gauge needle.  I passed a Benson wire into the right common iliac artery and exchanged the needle for the 8-Fr sheath.  The left common femoral artery had a soft spot.  Dr. Edilia Bo cannulated this but the wire would not advance.  I was able to get the [facility] wire to loop in the distal external iliac artery.  The needle was exchanged for a KMP catheter.  Using this combination of wire and catheter, I able to get into the left common iliac artery.  The catheter was removed and I was able to place a long 8-Fr sheath into the common femoral artery over the wire and advance it up to distal common iliac artery.   Using the KMP catheter, Dr. Edilia Bo then steered into the suprarenal aorta.  The wire was exchanged for an Amplatz wire.  I then placed the KMP catheter over the right wire and steered into the suprarenal aorta.  The wire was exchanged for an Amplatz wire.  The patient was given 6500 units of Heparin  intravenously, which was a therapeutic bolus.    At this point, Dr. Edilia Bo exchanged the left sheath for a 12-Fr Dryseal sheath.  The sheath on the right side was exchanged for a 18-French Dryseal sheath which was advanced into the distal aorta. The main body (31 mm x 14 mm x 17 cm) was loaded over the left wire and advanced to L1.   A pigtail catheter was loaded over the left wire and advanced into the suprarenal position. The catheter was connected to the  power injector circuit after a de-airring and de-clotting maneuver. A power injector aortogram was completed, demonstrating the position of the left renal artery. The main body was deployed below the level of the left renal artery.   A repeat aortogram was completed, which demonstrated apposition against the right side with 2 mm drop on left side due to likely calcific ledge.  I did not think this could be easily resolved by repositioning the main body and coverage the right renal artery was possible with repositioning.   At this point, Dr. Edilia Bo placed an Amplatz wire through the pigtail catheter to help cannulation with a buddy wire technique.  A KMP catheter with Glidewire was placed through the left Dryseal sheath, and using this combination, Dr. Edilia Bo was able to cannulate the contralateral gate, which was in a crossed limb configuration. He was then able to advance both of these into the suprarenal aorta. The wire was exchanged for an Amplatz wire.  The catheter was exchanged for a pigtail catheter which was advanced into the main body.  The wire was pulled back and the catheter was spun to prove successful cannulation of the main body.  The wire and pigtail catheter were advanced through the main body.  A marker on pigtail catheter was aligned with the flow divider. The left sheath was pulled back on the left side and Dr. Edilia Bo did a retrograde injection from the sheath demonstrating the level of the internal iliac artery. Then subsequently based on the measurements, a 18-mm x 11.5 cm contralateral limb was selected. The contralateral limb was then placed through this sheath and then the sheath was pulled back to appropriate location. The iliac device was then deployed with adequate overlap. He then removed the sheath deployment system. At this point, I pulled the right sheath down to distal to the internal iliac artery.  I did a hand injection which demonstrated the internal iliac artery position on  this right side.  I pulled the right Amplatz wire down into the sheath to allow the iliac artery to resume prior anatomy.  I slowly deployed the right limb of the main body while slowly pushing the limb forward, allowing the tortuosity of the right common iliac artery to take up the length on the right iliac limb.  I  removed the stent delivery device.  The Q45 aortic molding balloon was then loaded onto the left wire. I inflated at the proximal aspect of the graft, overlap and distal iliac limb. I removed the balloon and replaced it on the right wire. I inflated the balloon at the distal extent of the right limb.  A pigtail catheter was loaded over the left wire and placed in suprarenal position. A completion aortogram was completed: a possible Type I with small type II endoleak was present and renal arteries remain patent.  I pulled the catheter back into the body of the graft.  I replaced the balloon on the right wire and reinflated it at the  proximal extent of the stent graft.  I then readvanced the catheter.  Completion aortogram was completed: a possible Type I with small type II endoleak was present and renal arteries remain patent.  Based on the images, I felt placement of an aortic cuff was necessary despite the risk of occluding the renal arteries.  I suspected a possible ledge in the left side of the aorta as the cause of inadequate seal.  I placed a 32 mm x 4 cm aortic cuff over the right wire and advanced it to proximity of the renal artery take off.  I did a magnified injection of the renal take off to identify exactly the position of the renal arteries.  I deployed the cuff at the level of the left renal artery, above the level of the aortic shelf.  The stent delivery system was removed.  Completion aortogram demonstrated complete coverage of the aortic neck with the cuff extending 1 mm above the level of the right renal artery take off without any evidence of compromise of the renal arteries.  I  replaced the aortic molding balloon and inflated the balloon centered on the cuff.  The completion aortogram demonstrates resolution of the left side type I endoleak and persistence of the small Type II endoleak.  I expect this endoleak to resolve with time and reversal of anticoagulation.    At this point, I placed the right superficial femoral artery and profunda femoral artery under tension distally.  Circumflex iliac branches were also placed under tension.  I pulled out the right femoral sheath without any difficult.  I pulled out the right wire and then clamped the right distal external iliac artery.  The common femoral artery was repaired with a running stitch of 5-0 Prolene.  In a similar fashion, the left common femoral artery sheath was removed and the common femoral artery was clamped proximally and distally by Dr. Edilia Bo.  The common femoral artery was repaired with interrupted stitches of 5-0 Prolene.  Thrombin and gelfoam was applied to both groins.  30 mg of Protamine was given to reverse anticoagulation.  The left common femoral artery required an additional 5-0 Prolene stitch to stop active bleeding.  Both groins were repaired with a layer of 2-0 Vicryl in the deep subcutaneous tissue and a double layer of 3-0 Vicryl in the superficial layer.  The skin was reapproximated in both groin with a running subcuticular stitch of 4-0 Monocryl.  The skin was cleaned, dried, and reinforced with Dermabond in both groins.  There were dopplerable dorsalis pedis signals in both feet at the end of the case.  SPECIMEN(S): none   COMPLICATIONS: none   CONDITION: stable   Leonides Sake, MD [facility] [place] Office: [phone] Pager: [phone]  04/14/2013, 11:12 AM

## 2013-04-14 NOTE — Op Note (Signed)
NAME: MONT JAGODA    MRN: [id number] DOB: 10/27/1932    DATE OF OPERATION: 04/14/2013  PREOP DIAGNOSIS: abdominal aortic aneurysm  POSTOP DIAGNOSIS: same  PROCEDURE: endovascular aneurysm repair  CO-SURGEONS: Di Kindle. Edilia Bo, MD, Leonides Sake, MD  ANESTHESIA: Gen.   EBL: per anesthesia record  INDICATIONS: CHRISTINE SCHIEFELBEIN is a 79 y.o. male who presented with a large asymptomatic abdominal aortic aneurysm. He presents for EVAR  TECHNIQUE: patient was taken to the operating room and received a general anesthetic. The abdomen and both groins were prepped and draped in usual sterile fashion. On the left side, an oblique incision was made below the inguinal crease. Dissection was carried down to the common femoral artery which was markedly calcified. There was a soft spot anteriorly. I was able to get control the common femoral artery above and below the area that was soft where I could clamp it. On the left side the artery was cannulated and with some difficulty where we'll get a guidewire up the left side. The patient likely had a calcific posterior plaque. The [facility] wire was exchanged for an Amplatz wire after the 8 [place] sheath had been introduced. A 12 French sheath was advanced up the Amplatz wire on the left side.  After the main trunk ipsilateral component had been deployed the contralateral gate was cannulated. It was done the Kumpe wire and an angled Glidewire. I confirmed that the wire was intraluminal by exchanging the Kumpe catheter for the pigtail catheter which was able to turn within the main body of the graft. The contralateral limb was then advanced through the 12 French sheath on the left side and positioned in the contralateral gate. This was then deployed without difficulty. Of note we had a preoperative measurements to be sure that that we preserved flow in the hypogastric artery. This was done through a retrograde arteriogram through the left femoral sheath.  At the  completion of the procedure the sheath and wire were removed on the left and the artery was clamped above and below. The arteriotomy was closed with interrupted 5-0 Prolene sutures. Hemostasis was obtained in the wound. The wound was closed with a deep layer of 2-0 Vicryl. The subcutaneous layer was closed with 2 deep layers of 30R courts conclusively 4-0 subcuticular stitch. Dermabond was applied. The patient tolerated the procedure well and was transferred to the recovery room in stable condition. All needle and sponge counts were correct.  Waverly Ferrari, MD, FACS [facility]  DATE OF DICTATION:   04/14/2013

## 2013-04-14 NOTE — Anesthesia Postprocedure Evaluation (Signed)
Anesthesia Post Note  Patient: Tyler [name]  Procedure(s) Performed: Procedure(s) (LRB): ABDOMINAL AORTIC ENDOVASCULAR STENT GRAFT (N/A)  Anesthesia type: general  Patient location: PACU  Post pain: Pain level controlled  Post assessment: Patient's Cardiovascular Status Stable  Last Vitals:  Filed Vitals:   04/14/13 1330  BP:   Pulse: 74  Temp:   Resp: 14    Post vital signs: Reviewed and stable  Level of consciousness: sedated  Complications: No apparent anesthesia complications

## 2013-04-14 NOTE — Anesthesia Preprocedure Evaluation (Addendum)
Anesthesia Evaluation  Patient identified by MRN, date of birth, ID band Patient awake    Reviewed: Allergy & Precautions, H&P , NPO status , Patient's Chart, lab work & pertinent test results  History of Anesthesia Complications Negative for: history of anesthetic complications  Airway Mallampati: I TM Distance: >3 FB Neck ROM: Full    Dental  (+) Edentulous Upper, Edentulous Lower and Dental Advisory Given   Pulmonary COPD COPD inhaler and oxygen dependent,    Pulmonary exam normal       Cardiovascular hypertension, Pt. on medications + Peripheral Vascular Disease + dysrhythmias Atrial Fibrillation     Neuro/Psych negative neurological ROS  negative psych ROS   GI/Hepatic Neg liver ROS, GERD-  Medicated,  Endo/Other  negative endocrine ROS  Renal/GU Renal InsufficiencyRenal disease     Musculoskeletal   Abdominal   Peds  Hematology   Anesthesia Other Findings   Reproductive/Obstetrics                          Anesthesia Physical Anesthesia Plan  ASA: III  Anesthesia Plan: General   Post-op Pain Management:    Induction: Intravenous  Airway Management Planned: Oral ETT  Additional Equipment:   Intra-op Plan:   Post-operative Plan: Extubation in OR  Informed Consent: I have reviewed the patients History and Physical, chart, labs and discussed the procedure including the risks, benefits and alternatives for the proposed anesthesia with the patient or authorized representative who has indicated his/her understanding and acceptance.   Dental advisory given  Plan Discussed with: CRNA, Anesthesiologist and Surgeon  Anesthesia Plan Comments:        Anesthesia Quick Evaluation

## 2013-04-14 NOTE — Progress Notes (Signed)
Utilization review completed.  

## 2013-04-14 NOTE — H&P (View-Only) (Signed)
[facility] [place]  Established Abdominal Aortic Aneurysm  History of Present Illness  The patient is a 79 y.o. (01/07/1933) male who presents with chief complaint: follow up for AAA.  Based on review of CT abd/pelvis, pt probably candidate for EVAR.  Pt would like proceed.  He has stated he would request repair if he came to the ED with a ruptured AAA.  His oncologist thinks he will be alive over the next 6-12 months.  Past Medical History  Diagnosis Date  . BENIGN PROSTATIC HYPERTROPHY 07/23/2008  . 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 HIP PAIN, LEFT 07/04/2007  . HYPERLIPIDEMIA 01/02/2007  . HYPERTENSION 01/02/2007  . INSOMNIA 11/16/2008  . PROSTATE SPECIFIC ANTIGEN, ELEVATED 09/10/2008  . Lung cancer     metastatic non small cell lung cancer, adenocarcinoma   . AAA (abdominal aortic aneurysm)     Past Surgical History  Procedure Laterality Date  . Transurethral resection of prostate  2004  . Total hip arthroplasty  2010, 2011    2010- left; 2011- right  . Upper gastrointestinal endoscopy      History   Social History  . Marital Status: Widowed    Spouse Name: N/A    Number of Children: N/A  . Years of Education: N/A   Occupational History  . Retired Pharmacist    Social History Main Topics  . Smoking status: Former Smoker -- 1.00 packs/day for 40 years    Types: Cigarettes    Quit date: 06/18/1998  . Smokeless tobacco: Never Used  . Alcohol Use: No  . Drug Use: No  . Sexual Activity: No   Other Topics Concern  . Not on file   Social History Narrative  . No narrative on file   Family History  Problem Relation Age of Onset  . Colon polyps Neg Hx   . Stomach cancer Neg Hx   . Colon cancer Neg Hx   . Cancer Neg Hx    Current Outpatient Prescriptions on File Prior to Visit  Medication Sig Dispense Refill  . cholecalciferol (VITAMIN D) 1000 UNITS tablet Take 1,000 Units by mouth every morning.       . clotrimazole (MYCELEX) 10 MG  troche       . dexamethasone (DECADRON) 4 MG tablet Take 4 mg by mouth 2 (two) times daily with a meal. Take while on Chemo      . docusate sodium (COLACE) 250 MG capsule Take 1 capsule (250 mg total) by mouth daily.  10 capsule  0  . folic acid (FOLVITE) 1 MG tablet Take 1 mg by mouth every morning.      . HYDROcodone-homatropine (HYCODAN) 5-1.5 MG/5ML syrup Take 5 mLs by mouth every 6 (six) hours as needed for cough.  240 mL  0  . mirtazapine (REMERON) 15 MG tablet Take 1 tablet (15 mg total) by mouth at bedtime.  30 tablet  1  . multivitamin-iron-minerals-folic acid (CENTRUM) chewable tablet Chew 1 tablet by mouth every morning.       . prochlorperazine (COMPAZINE) 10 MG tablet Take 10 mg by mouth every 6 (six) hours as needed (nausea).       . senna (SENOKOT) 8.6 MG tablet Take 1 tablet by mouth daily as needed for constipation.      . tamsulosin (FLOMAX) 0.4 MG CAPS capsule       . amoxicillin (AMOXIL) 875 MG tablet       . citalopram (CELEXA) 10 MG tablet       .   diltiazem (CARDIZEM) 30 MG tablet Take 1 tablet (30 mg total) by mouth 4 (four) times daily as needed (for irregular heart rate  greater than 120.).  30 tablet  0  . diltiazem (DILACOR XR) 240 MG 24 hr capsule       . feeding supplement (RESOURCE BREEZE) LIQD Take 1 Container by mouth daily.    0  . fluconazole (DIFLUCAN) 200 MG tablet       . guaiFENesin-codeine (ROBITUSSIN AC) 100-10 MG/5ML syrup Take 1-2 tsp up to every 6 hours if needed for cough.  120 mL  0  . levofloxacin (LEVAQUIN) 750 MG tablet       . lidocaine (XYLOCAINE) 2 % solution       . LORazepam (ATIVAN) 1 MG tablet       . omeprazole (PRILOSEC) 20 MG capsule       . predniSONE (DELTASONE) 10 MG tablet       . sucralfate (CARAFATE) 1 G tablet       . traMADol (ULTRAM) 50 MG tablet        No current facility-administered medications on file prior to visit.    Allergies  Allergen Reactions  . Vicodin [Hydrocodone-Acetaminophen] Other (See Comments)     hallucinations   REVIEW OF SYSTEMS: (Positives checked otherwise negative)  CARDIOVASCULAR: [] chest pain, [] chest pressure, [] palpitations, [] shortness of breath when laying flat, [x] shortness of breath with exertion, [x] pain in feet when walking, [] pain in feet when laying flat, [] history of blood clot in veins (DVT), [] history of phlebitis, [] swelling in legs, [] varicose veins  PULMONARY: [x] productive cough, [] asthma, [] wheezing  NEUROLOGIC: [x] weakness in arms or legs, [] numbness in arms or legs, [x] difficulty speaking or slurred speech, [] temporary loss of vision in one eye, [x] dizziness  HEMATOLOGIC: [] bleeding problems, [] problems with blood clotting too easily  MUSCULOSKEL: [] joint pain, [] joint swelling  GASTROINTEST: [] vomiting blood, [] blood in stool  GENITOURINARY: [] burning with urination, [] blood in urine  PSYCHIATRIC: [] history of major depression  INTEGUMENTARY: [] rashes, [] ulcers  CONSTITUTIONAL: [] fever, [] chills   For VQI Use Only  PRE-ADM LIVING: Home  AMB STATUS: Ambulatory with Assistance  CAD Sx: None  PRIOR CHF: None  STRESS TEST: [x] No, [ ] Normal, [ ] + ischemia, [ ] + MI, [ ] Both   Physical Examination  Filed Vitals:   04/03/13 1358  BP: 127/87  Pulse: 83  Height: 5' 8" (1.727 m)  Weight: 161 lb (73.029 kg)   Body mass index is 24.49 kg/(m^2).  General: A&O x 3, WD, elderly, Cachectic , weak appearing   Pulmonary: Sym exp, good air movt, CTAB, no rales, rhonchi, & wheezing   Cardiac: RRR, faint HS, no Murmurs, rubs or gallops   Vascular:  Vessel  Right  Left   Radial  Palpable  Palpable   Brachial  Palpable  Palpable   Carotid  Palpable, without bruit  Palpable, without bruit   Aorta  Prominently palpable  N/A   Femoral  Palpable  Palpable   Popliteal  Not palpable  Not palpable   PT  Not Palpable  Palpable   DP  Not Palpable  Palpable    Gastrointestinal: soft, NTND, -G/R, - HSM, - masses, - CVAT B,  umbilical hernia, and large left inguinal hernia   Musculoskeletal: M/S 5/5 throughout , Extremities without ischemic changes     Neurologic: Pain and light touch intact in extremities , Motor exam as listed above   Medical Decision Making  The patient is a 79 y.o. male who presents with: large asymptomatic AAA , Stage IV lung cancer, multiple co-morbiities   At the patient is going to request repair in the setting of acute ruptured AAA, the risk-benefit issues are irrelevant as he will be offered repair in anyway in an emergent setting, so it make more sense to go ahead in an elective fashion.  At a his size of aneurysm, his annual rupture rate is 30-40% minimally, so if his life expectancy is > 6-12 months, there is a real chance of rupture occuring.  The problem may be that his right iliac system might prevent placement of a the right iliac extension limb, so first an aortogram via right femoral approach is recommended.  If an Amplatz wire can traverse the right iliac system, it is reasonable to attempt the EVAR, knowing the patient still likely to died but more likely due to his lung cancer and multiple co-morbidities.  I have scheduled his aortogram for this coming Thursday, 23 OCT 14.    I discussed with the patient the nature of angiographic procedures, especially the limited patencies of any endovascular intervention.  The patient is aware of that the risks of an angiographic procedure include but are not limited to: bleeding, infection, access site complications, renal failure, embolization, rupture of vessel, dissection, possible need for emergent surgical intervention, possible need for surgical procedures to treat the patient's pathology, anaphylactic reaction to contrast, and stroke and death.    The patient is aware of the risks and agrees to proceed.  Thank you for allowing us to participate in this patient's care.  Demarko Zeimet, MD [facility]  [place] Office: [phone] Pager: [phone]  04/03/2013, 5:41 PM      

## 2013-04-14 NOTE — Preoperative (Signed)
Beta Blockers   Reason not to administer Beta Blockers:Not Applicable 

## 2013-04-14 NOTE — Transfer of Care (Signed)
Immediate Anesthesia Transfer of Care Note  Patient: Tyler [name]  Procedure(s) Performed: Procedure(s): ABDOMINAL AORTIC ENDOVASCULAR STENT GRAFT (N/A)  Patient Location: PACU  Anesthesia Type:General  Level of Consciousness: awake, alert  and oriented  Airway & Oxygen Therapy: Patient Spontanous Breathing and Patient connected to nasal cannula oxygen  Post-op Assessment: Report given to PACU RN  Post vital signs: Reviewed and stable  Complications: No apparent anesthesia complications

## 2013-04-15 ENCOUNTER — Telehealth: Payer: Self-pay | Admitting: Vascular Surgery

## 2013-04-15 LAB — CBC
HCT: 28.3 % — ABNORMAL LOW (ref 39.0–52.0)
Hemoglobin: 10 g/dL — ABNORMAL LOW (ref 13.0–17.0)
MCH: 30.3 pg (ref 26.0–34.0)
MCHC: 35.3 g/dL (ref 30.0–36.0)
RBC: 3.3 MIL/uL — ABNORMAL LOW (ref 4.22–5.81)
WBC: 11.2 10*3/uL — ABNORMAL HIGH (ref 4.0–10.5)

## 2013-04-15 LAB — COMPREHENSIVE METABOLIC PANEL
ALT: 9 U/L (ref 0–53)
Alkaline Phosphatase: 42 U/L (ref 39–117)
BUN: 17 mg/dL (ref 6–23)
Calcium: 8.4 mg/dL (ref 8.4–10.5)
Chloride: 96 mEq/L (ref 96–112)
GFR calc Af Amer: 61 mL/min — ABNORMAL LOW (ref >90)
GFR calc non Af Amer: 53 mL/min — ABNORMAL LOW (ref >90)
Glucose, Bld: 144 mg/dL — ABNORMAL HIGH (ref 70–99)
Potassium: 4.1 mEq/L (ref 3.5–5.1)
Sodium: 131 mEq/L — ABNORMAL LOW (ref 135–145)
Total Bilirubin: 0.5 mg/dL (ref 0.3–1.2)
Total Protein: 6.1 g/dL (ref 6.0–8.3)

## 2013-04-15 MED ORDER — TRAMADOL HCL 50 MG PO TABS: 50.0000 mg | ORAL_TABLET | Freq: Four times a day (QID) | ORAL | Status: DC | PRN

## 2013-04-15 NOTE — Progress Notes (Signed)
Patient being discharged per MD order. All discharge instructions have been given and patient and family voiced a understanding. VSS

## 2013-04-15 NOTE — Discharge Summary (Addendum)
[facility] Discharge Summary  Tyler [name] 09/15/1932 79 y.o. male  [id number]  Admission Date: 04/14/2013  Discharge Date: 04/15/13  Physician: Fransisco Hertz, MD  Admission Diagnosis: AAA severe COPD  Secondary Diagnoses: Past Medical History  Diagnosis Date  . BENIGN PROSTATIC HYPERTROPHY 07/23/2008  . 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 HIP PAIN, LEFT 07/04/2007  . HYPERLIPIDEMIA 01/02/2007  . HYPERTENSION 01/02/2007  . INSOMNIA 11/16/2008  . PROSTATE SPECIFIC ANTIGEN, ELEVATED 09/10/2008  . Lung cancer     metastatic non small cell lung cancer, adenocarcinoma   . AAA (abdominal aortic aneurysm)   . Dysrhythmia     afib  . Shortness of breath   . COPD (chronic obstructive pulmonary disease)   . GERD (gastroesophageal reflux disease)   . Anemia   . History of blood transfusion    Melanoma  Severe protein calorie malnutrition   HPI:   This is a 79 y.o. male who presents with chief complaint: follow up for AAA. Based on review of CT abd/pelvis, pt probably candidate for EVAR. Pt would like proceed. He has stated he would request repair if he came to the ED with a ruptured AAA. His oncologist thinks he will be alive over the next 6-12 months.  Hospital Course:  The patient was admitted to the hospital and taken to the operating room on 04/14/2013 and underwent: 1. Bilateral common femoral artery exposure and repair 2. Placement of catheter in aorta x 2 3. Aortogram 4. Repair of aorta with modular bifurcated prosthesis with one limb (31mm x 14 mm x 17 cm) 5. Placement of left iliac limb (18 mm x 11.5 cm) 6. Placement of aortic cuff (32 mm x 4 cm) 7. Radiology S&I    The pt tolerated the procedure well and was transported to the PACU in good condition. By POD 1, his foley catheter was removed and he was able to void.  He ambulated and voided well. The remainder of the hospital course consisted of increasing mobilization and increasing intake  of solids without difficulty.  CBC    Component Value Date/Time   WBC 11.2* 04/15/2013 0430   WBC 4.2 03/26/2013 1042   RBC 3.30* 04/15/2013 0430   RBC 3.40* 03/26/2013 1042   HGB 10.0* 04/15/2013 0430   HGB 10.0* 03/26/2013 1042   HCT 28.3* 04/15/2013 0430   HCT 29.2* 03/26/2013 1042   PLT 186 04/15/2013 0430   PLT 276 03/26/2013 1042   MCV 85.8 04/15/2013 0430   MCV 86.1 03/26/2013 1042   MCH 30.3 04/15/2013 0430   MCH 29.6 03/26/2013 1042   MCHC 35.3 04/15/2013 0430   MCHC 34.3 03/26/2013 1042   RDW 17.9* 04/15/2013 0430   RDW 15.4* 03/26/2013 1042   LYMPHSABS 0.5* 03/26/2013 1042   LYMPHSABS 0.7 03/17/2013 2000   MONOABS 1.0* 03/26/2013 1042   MONOABS 0.4 03/17/2013 2000   EOSABS 0.1 03/26/2013 1042   EOSABS 0.0 03/17/2013 2000   BASOSABS 0.0 03/26/2013 1042   BASOSABS 0.0 03/17/2013 2000    BMET    Component Value Date/Time   NA 131* 04/15/2013 0500   NA 130* 03/26/2013 1042   K 4.1 04/15/2013 0500   K 4.4 03/26/2013 1042   CL 96 04/15/2013 0500   CO2 22 04/15/2013 0500   CO2 30* 03/26/2013 1042   GLUCOSE 144* 04/15/2013 0500   GLUCOSE 163* 03/26/2013 1042   BUN 17 04/15/2013 0500   BUN 13.2 03/26/2013 1042   CREATININE 1.27 04/15/2013  0500   CREATININE 1.3 03/26/2013 1042   CALCIUM 8.4 04/15/2013 0500   CALCIUM 9.3 03/26/2013 1042   GFRNONAA 53* 04/15/2013 0500   GFRAA 61* 04/15/2013 0500     Discharge Instructions:   The patient is discharged to home with extensive instructions on wound care and progressive ambulation.  They are instructed not to drive or perform any heavy lifting until returning to see the physician in his office.  Discharge Orders   Future Appointments Provider Department Dept Phone   04/22/2013 11:15 AM Mauri [facility] [phone]   04/22/2013 11:45 AM Conni Slipper, PA-C [place] [facility] [phone]   Future Orders Complete By Expires   ABDOMINAL PROCEDURE/ANEURYSM  REPAIR/AORTO-BIFEMORAL BYPASS:  Call MD for increased abdominal pain; cramping diarrhea; nausea/vomiting  As directed    Call MD for:  redness, tenderness, or signs of infection (pain, swelling, bleeding, redness, odor or green/yellow discharge around incision site)  As directed    Call MD for:  severe or increased pain, loss or decreased feeling  in affected limb(s)  As directed    Call MD for:  temperature >100.5  As directed    Driving Restrictions  As directed    Comments:     No driving for 2 weeks   Increase activity slowly  As directed    Comments:     Walk with assistance use walker or cane as needed   Lifting restrictions  As directed    Comments:     No lifting for 4 weeks   May shower   As directed    Scheduling Instructions:     Thursday   may wash over wound with mild soap and water  As directed    No dressing needed  As directed    Resume previous diet  As directed       Discharge Diagnosis:  AAA severe COPD  Secondary Diagnosis: Patient Active Problem List   Diagnosis Date Noted  . Aneurysm of infrarenal abdominal aorta 03/27/2013  . New onset a-fib 03/16/2013  . Hypotension 03/16/2013  . Dehydration 03/16/2013  . Hyponatremia 03/16/2013  . Pancytopenia 03/16/2013  . Cough 01/19/2013  . Non-small cell carcinoma of lung 01/02/2013  . Pericardial effusion 12/17/2012  . Dyspnea 12/08/2012  . COPD, moderate 12/08/2012  . 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 INSOMNIA 11/16/2008  . PROSTATE SPECIFIC ANTIGEN, ELEVATED 09/10/2008  . BENIGN PROSTATIC HYPERTROPHY 07/23/2008  . HIP PAIN, LEFT 07/04/2007  . HYPERLIPIDEMIA 01/02/2007  . HYPERTENSION 01/02/2007   Past Medical History  Diagnosis Date  . BENIGN PROSTATIC HYPERTROPHY 07/23/2008  . 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 HIP PAIN, LEFT 07/04/2007  . HYPERLIPIDEMIA 01/02/2007  . HYPERTENSION 01/02/2007  . INSOMNIA 11/16/2008  . PROSTATE SPECIFIC ANTIGEN, ELEVATED 09/10/2008  . Lung cancer      metastatic non small cell lung cancer, adenocarcinoma   . AAA (abdominal aortic aneurysm)   . Dysrhythmia     afib  . Shortness of breath   . COPD (chronic obstructive pulmonary disease)   . GERD (gastroesophageal reflux disease)   . Anemia   . History of blood transfusion        Medication List         cholecalciferol 1000 UNITS tablet  Commonly known as:  VITAMIN D  Take 1,000 Units by mouth every morning.     dexamethasone 4 MG tablet  Commonly known as:  DECADRON  Take 4 mg  by mouth 2 (two) times daily with a meal. Take while on Chemo     folic acid 1 MG tablet  Commonly known as:  FOLVITE  Take 1 mg by mouth every morning.     HYDROcodone-homatropine 5-1.5 MG/5ML syrup  Commonly known as:  HYCODAN  Take 5 mLs by mouth every 6 (six) hours as needed for cough.     Ipratropium-Albuterol 20-100 MCG/ACT Aers respimat  Commonly known as:  COMBIVENT RESPIMAT  Inhale 1 puff into the lungs every 6 (six) hours as needed for wheezing.     mirtazapine 15 MG tablet  Commonly known as:  REMERON  Take 1 tablet (15 mg total) by mouth at bedtime.     multivitamin-iron-minerals-folic acid chewable tablet  Chew 1 tablet by mouth every morning.     polyethylene glycol powder powder  Commonly known as:  GLYCOLAX/MIRALAX  Take 17 g by mouth daily as needed (for constipation).     propranolol 10 MG tablet  Commonly known as:  INDERAL  Take 1 tablet by mouth 2 (two) times daily.     traMADol 50 MG tablet  Commonly known as:  ULTRAM  Take 1 tablet (50 mg total) by mouth every 6 (six) hours as needed for pain.       Ultram #30 No Refill  Disposition: home  Patient's condition: is Good  Follow up: 1. Dr. Imogene Burn in 4 weeks with CTA.  Hopefully, the CTA can coincide with CT scan needed by Dr. Franco Collet, PA-C [facility] [phone] 04/15/2013  7:49 AM  Addendum  I have independently interviewed and examined the patient, and I agree  with the physician assistant's discharge summary.  This patient with stage IV cancer underwent EVAR for repair of a large AAA.  He has done well post-operatively and met criteria for discharge.  He has significant co-morbidities including two cancers: lung and melanoma, along with significant functional impairment from multiple co-morbidities.  He was repaired electively as his Oncologist felt his projected lifespan would put him at significant risk of AAA rupture given its size.   The patient will follow up in the clinic in 2-4 weeks for post-operative wound check and CTA.   Leonides Sake, MD [facility] [place] Office: [phone] Pager: [phone]  04/15/2013, 2:38 PM    - For VQI Registry use --- Instructions: Press F2 to tab through selections.  Delete question if not applicable.   Post-op:  Time to Extubation: [x ] In OR, [ ]  < 12 hrs, [ ]  12-24 hrs, [ ]  >=24 hrs Vasopressors Req. Post-op: No MI: no, [ ]  Troponin only, [ ]  EKG or Clinical New Arrhythmia: No CHF: No ICU Stay: 1 day in stepdown Transfusion: Yes  If yes, 2 units given  Complications: Resp failure: no, [ ]  Pneumonia, [ ]  Ventilator Chg in renal function: no, [ ]  Inc. Cr > 0.5, [ ]  Temp. Dialysis, [ ]  Permanent dialysis Leg ischemia: no, no Surgery needed, [ ]  Yes, Surgery needed, [ ]  Amputation Bowel ischemia: no, [ ]  Medical Rx, [ ]  Surgical Rx Wound complication: no, [ ]  Superficial separation/infection, [ ]  Return to OR Return to OR: No  Return to OR for bleeding: No Stroke: no, [ ]  Minor, [ ]  Major  Discharge medications: Statin use:  No If No: [ ]  For Medical reasons, [ ]  Non-compliant, [ ]  Not-indicated ASA use:  No  If No: [ ]  For Medical reasons, [ ]   Non-compliant, [ ]  Not-indicated Plavix use:  No If No: [ ]  For Medical reasons, [ ]  Non-compliant, [ ]  Not-indicated Beta blocker use:  Yes If No: [ ]  For Medical reasons, [ ]  Non-compliant, [ ]  Not-indicated

## 2013-04-15 NOTE — Progress Notes (Addendum)
[facility] AAA Progress Note  04/15/2013 7:28 AM POD 1  Subjective:  No complaints with exception of soreness in the groins.  Has only taken Tylenol for pain.  Tm 99.7 now afebrile VSS 95% RA  Filed Vitals:   04/15/13 0345  BP: 124/81  Pulse: 83  Temp: 97.8 F (36.6 C)  Resp: 20    Physical Exam: Cardiac:  RRR Lungs:  CTAB Abdomen:  Soft, NT/ND Incisions:  Bilateral groins are soft without hematoma; incisions are c/d/i Extremities:  + palpable DP pulses bilaterally  CBC    Component Value Date/Time   WBC 11.2* 04/15/2013 0430   WBC 4.2 03/26/2013 1042   RBC 3.30* 04/15/2013 0430   RBC 3.40* 03/26/2013 1042   HGB 10.0* 04/15/2013 0430   HGB 10.0* 03/26/2013 1042   HCT 28.3* 04/15/2013 0430   HCT 29.2* 03/26/2013 1042   PLT 186 04/15/2013 0430   PLT 276 03/26/2013 1042   MCV 85.8 04/15/2013 0430   MCV 86.1 03/26/2013 1042   MCH 30.3 04/15/2013 0430   MCH 29.6 03/26/2013 1042   MCHC 35.3 04/15/2013 0430   MCHC 34.3 03/26/2013 1042   RDW 17.9* 04/15/2013 0430   RDW 15.4* 03/26/2013 1042   LYMPHSABS 0.5* 03/26/2013 1042   LYMPHSABS 0.7 03/17/2013 2000   MONOABS 1.0* 03/26/2013 1042   MONOABS 0.4 03/17/2013 2000   EOSABS 0.1 03/26/2013 1042   EOSABS 0.0 03/17/2013 2000   BASOSABS 0.0 03/26/2013 1042   BASOSABS 0.0 03/17/2013 2000    BMET    Component Value Date/Time   NA 131* 04/15/2013 0500   NA 130* 03/26/2013 1042   K 4.1 04/15/2013 0500   K 4.4 03/26/2013 1042   CL 96 04/15/2013 0500   CO2 22 04/15/2013 0500   CO2 30* 03/26/2013 1042   GLUCOSE 144* 04/15/2013 0500   GLUCOSE 163* 03/26/2013 1042   BUN 17 04/15/2013 0500   BUN 13.2 03/26/2013 1042   CREATININE 1.27 04/15/2013 0500   CREATININE 1.3 03/26/2013 1042   CALCIUM 8.4 04/15/2013 0500   CALCIUM 9.3 03/26/2013 1042   GFRNONAA 53* 04/15/2013 0500   GFRAA 61* 04/15/2013 0500    INR    Component Value Date/Time   INR 1.14 04/14/2013 1732     Intake/Output Summary (Last 24 hours) at  04/15/13 0728 Last data filed at 04/15/13 0538  Gross per 24 hour  Intake   3550 ml  Output   2650 ml  Net    900 ml     Assessment/Plan:  79 y.o. male is s/p  1. Bilateral common femoral artery exposure and repair 2. Placement of catheter in aorta x 2 3. Aortogram 4. Repair of aorta with modular bifurcated prosthesis with one limb (31mm x 14 mm x 17 cm) 5. Placement of left iliac limb (18 mm x 11.5 cm) 6. Placement of aortic cuff (32 mm x 4 cm) 7. Radiology S&I 8.    POD 1  -pt doing well this am BUN/Cr stable -he needs to eat and ambulate. -discharge later this morning -f/u with Dr. Imogene Burn in 4 weeks with a CTA -pt son states he has trouble with vicodin and oxycodone.  Pt says he has taken Ultram in the past and has done well.   Doreatha Massed, PA-C [facility] [phone] 04/15/2013 7:28 AM  Addendum  I have independently interviewed and examined the patient, and I agree with the physician assistant's findings.  No pulsatile AAA on exam.  Both  groins without hematoma.  Both feet warm with palpable DP.  Pt already voided.  Eating breakfast.  Ok to D/C if pt ambulates ok.  Other medical issues:  1.  Metastatic Lung Cancer: chemotherapy per Dr. Arbutus Ped  2.  Severe COPD: off oxygen, pt tolerated GETA without any complications, no titration of pulm rx needed  3.  HTN: BP acceptable, continue rx  4.  Hyperlipidemia: lipids at target with dietary control  5.  Melanoma:  Awaiting recovery from EVAR before proceeding with resection with Dermatology  6.  Anemia: pre-existing as evident by preop H/H, some acute intraoperative hemorrhage but only 200 cc at most, so I suspect some hemodilution.  H/H ok after 2 u pRBC.  I expect H/H to increase further after fluid autodiuresis in 2-3 days.  7.  Malnutrition, protein: albumin low, recommended supplementation, eat whatever he wants  8.  CKD Stage 2: GFR 61 this AM after contrast exposure from EVAR, stable  renal status   Leonides Sake, MD [facility] [place] Office: [phone] Pager: [phone]  04/15/2013, 8:33 AM

## 2013-04-15 NOTE — Telephone Encounter (Addendum)
Message copied by Fredrich Birks on Wed Apr 15, 2013  3:18 PM ------      Message from: Marlowe Shores      Created: Tue Apr 14, 2013  9:23 AM       4 week F/U EVAR with CTA Roe Coombs Darrold Junker Imogene Burn ------  04/15/13: spoke with [name] to give appt information, also mailed CTA instruction letter, dpm

## 2013-04-16 ENCOUNTER — Telehealth: Payer: Self-pay | Admitting: Medical Oncology

## 2013-04-16 LAB — TYPE AND SCREEN
ABO/RH(D): O NEG
Antibody Screen: NEGATIVE
Unit division: 0
Unit division: 0
Unit division: 0

## 2013-04-16 NOTE — Telephone Encounter (Signed)
Per Dr Arbutus Ped I left message for Bradin that I can schedule him for 0915 tomorrow  for brief appointment with Dr Arbutus Ped.

## 2013-04-16 NOTE — Telephone Encounter (Signed)
Pt had surgery and asking if he can move his appt up to Friday so his son can attend appointment.

## 2013-04-22 ENCOUNTER — Ambulatory Visit (HOSPITAL_BASED_OUTPATIENT_CLINIC_OR_DEPARTMENT_OTHER): Payer: Medicare Other | Admitting: Physician Assistant

## 2013-04-22 ENCOUNTER — Other Ambulatory Visit (HOSPITAL_BASED_OUTPATIENT_CLINIC_OR_DEPARTMENT_OTHER): Payer: Medicare Other | Admitting: Lab

## 2013-04-22 ENCOUNTER — Encounter: Payer: Self-pay | Admitting: Physician Assistant

## 2013-04-22 VITALS — BP 108/74 | HR 80 | Temp 96.8°F | Resp 17 | Ht 68.0 in | Wt 163.5 lb

## 2013-04-22 DIAGNOSIS — C3410 Malignant neoplasm of upper lobe, unspecified bronchus or lung: Secondary | ICD-10-CM

## 2013-04-22 DIAGNOSIS — R05 Cough: Secondary | ICD-10-CM

## 2013-04-22 DIAGNOSIS — F329 Major depressive disorder, single episode, unspecified: Secondary | ICD-10-CM

## 2013-04-22 DIAGNOSIS — C3490 Malignant neoplasm of unspecified part of unspecified bronchus or lung: Secondary | ICD-10-CM

## 2013-04-22 DIAGNOSIS — C3492 Malignant neoplasm of unspecified part of left bronchus or lung: Secondary | ICD-10-CM

## 2013-04-22 LAB — CBC WITH DIFFERENTIAL/PLATELET
BASO%: 1 % (ref 0.0–2.0)
Basophils Absolute: 0.1 10*3/uL (ref 0.0–0.1)
EOS%: 8.8 % — ABNORMAL HIGH (ref 0.0–7.0)
HCT: 30.1 % — ABNORMAL LOW (ref 38.4–49.9)
HGB: 10 g/dL — ABNORMAL LOW (ref 13.0–17.1)
MCH: 29.3 pg (ref 27.2–33.4)
MCHC: 33.2 g/dL (ref 32.0–36.0)
MCV: 88.3 fL (ref 79.3–98.0)
MONO%: 9.6 % (ref 0.0–14.0)
NEUT%: 73.5 % (ref 39.0–75.0)
RDW: 17.5 % — ABNORMAL HIGH (ref 11.0–14.6)

## 2013-04-22 LAB — COMPREHENSIVE METABOLIC PANEL (CC13)
AST: 18 U/L (ref 5–34)
Alkaline Phosphatase: 52 U/L (ref 40–150)
BUN: 19.3 mg/dL (ref 7.0–26.0)
Creatinine: 1.3 mg/dL (ref 0.7–1.3)
Glucose: 147 mg/dl — ABNORMAL HIGH (ref 70–140)

## 2013-04-22 NOTE — Progress Notes (Addendum)
Tyler [name] [phone]   [phone]  SHARED VISIT PROGRESS NOTE  Tyler Chick, MD [address] [place] [address]  DIAGNOSIS AND STAGE: Metastatic non-small cell lung cancer, adenocarcinoma presented with left upper lobe lung mass in addition to extensive supraclavicular, mediastinal adenopathy and bilateral pulmonary nodules diagnosed in July of 2014. EGFR mutation as well as ALK gene translocation are negative.   PRIOR THERAPY: Palliative radiotherapy to the enlarged mediastinal and supraclavicular lymph nodes under the care of Dr. Kathrynn Running.   CURRENT THERAPY:  Systemic chemotherapy with carboplatin for AUC of 5 and Alimta 500 mg/M2 giving every 3 weeks. First dose given on 01/21/2013. Status post 3 cycles.  CHEMOTHERAPY INTENT: Palliative  CURRENT # OF CHEMOTHERAPY CYCLES: 3  CURRENT ANTIEMETICS: Zofran, dexamethasone and Compazine  CURRENT SMOKING STATUS: currently a nonsmoker  ORAL CHEMOTHERAPY AND CONSENT: None  CURRENT BISPHOSPHONATES USE: None  PAIN MANAGEMENT: No pain  NARCOTICS INDUCED CONSTIPATION: N/A  LIVING WILL AND CODE STATUS: Full code   INTERVAL HISTORY: Tyler [name] 79 y.o. male returns to the clinic today for followup visit accompanied by his girlfriend and daughter. He complains of still having a cough although it is some what better with the Hycodan cough syrup. He complains of his voice continuing to get worse although there is some hoarseness prior to his receiving radiation therapy however it seems to get a bit worse after completing radiation therapy. He tends to get short of breath when talking and gets very tired when speaking. The longer he speaks the more hoarse and quiet his voice gets. He feels that his brain is not as sharp as it once was. He has occasional issues with constipation. His medical history has been complicated by paroxysmal atrial fibrillation, now on diltiazem. As needed for  heart rate greater than 120 bpm. He is also status post abdominal aortic aneurysm repair and a scheduled followup with Dr. Leonides Sake of vascular surgery on 05/22/2013 for a post surgery follow up. He still has some bilateral groin soreness from the surgery. He is eating well but smaller amounts. He is still sleeping a lot during the day as well as at night. The patient is still complaining of increasing fatigue and weakness. He also has shortness of breath with exertion and persistent cough. Overall he tolerated his chemotherapy with carboplatin and Alimta relatively well the exception of increasing fatigue and chemotherapy-induced anemia requiring packed red blood cell transfusion. He also reports some occasional issues with constipation. He denied having any fever or chills. He denied having any nausea or vomiting. He has not had chemotherapy and approximately 6 or 7 weeks having undergone the repair of the abdominal aortic aneurysm and postoperative convalescence. Presents today to discuss his treatment options. The patient and his family do report that his depression and crying episodes have improved significantly on the Remeron although the patient is not taking it as regularly as he should.  MEDICAL HISTORY: Past Medical History  Diagnosis Date  . BENIGN PROSTATIC HYPERTROPHY 07/23/2008  . 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 HIP PAIN, LEFT 07/04/2007  . HYPERLIPIDEMIA 01/02/2007  . HYPERTENSION 01/02/2007  . INSOMNIA 11/16/2008  . PROSTATE SPECIFIC ANTIGEN, ELEVATED 09/10/2008  . Lung cancer     metastatic non small cell lung cancer, adenocarcinoma   . AAA (abdominal aortic aneurysm)   . Dysrhythmia     afib  . Shortness of breath   . COPD (chronic obstructive pulmonary  disease)   . GERD (gastroesophageal reflux disease)   . Anemia   . History of blood transfusion     ALLERGIES:  is allergic to vicodin.  MEDICATIONS:  Current Outpatient Prescriptions  Medication Sig Dispense Refill   . cholecalciferol (VITAMIN D) 1000 UNITS tablet Take 1,000 Units by mouth every morning.       Marland Kitchen dexamethasone (DECADRON) 4 MG tablet Take 4 mg by mouth 2 (two) times daily with a meal. Take while on Chemo      . folic acid (FOLVITE) 1 MG tablet Take 1 mg by mouth every morning.      Marland Kitchen HYDROcodone-homatropine (HYCODAN) 5-1.5 MG/5ML syrup Take 5 mLs by mouth every 6 (six) hours as needed for cough.  240 mL  0  . mirtazapine (REMERON) 15 MG tablet Take 1 tablet (15 mg total) by mouth at bedtime.  30 tablet  1  . multivitamin-iron-minerals-folic acid (CENTRUM) chewable tablet Chew 1 tablet by mouth every morning.       . polyethylene glycol powder (GLYCOLAX/MIRALAX) powder Take 17 g by mouth daily as needed (for constipation).      . propranolol (INDERAL) 10 MG tablet Take 1 tablet by mouth 2 (two) times daily.      . Ipratropium-Albuterol (COMBIVENT RESPIMAT) 20-100 MCG/ACT AERS respimat Inhale 1 puff into the lungs every 6 (six) hours as needed for wheezing.  1 Inhaler  2  . traMADol (ULTRAM) 50 MG tablet Take 1 tablet (50 mg total) by mouth every 6 (six) hours as needed for pain.  30 tablet  0   No current facility-administered medications for this visit.    SURGICAL HISTORY:  Past Surgical History  Procedure Laterality Date  . Transurethral resection of prostate  2004  . Total hip arthroplasty  2010, 2011    2010- left; 2011- right  . Upper gastrointestinal endoscopy      REVIEW OF SYSTEMS:  Constitutional: positive for anorexia and fatigue Eyes: negative Ears, nose, mouth, throat, and face: positive for hoarseness, Shortness of breath and fatigue when speaking Respiratory: positive for cough and dyspnea on exertion Cardiovascular: negative Gastrointestinal: negative Genitourinary:negative Integument/breast: negative Hematologic/lymphatic: negative Musculoskeletal:positive for Postoperative incisional and bilateral groin pain Neurological: negative Behavioral/Psych: positive for  anxiety, depression and fatigue Endocrine: negative Allergic/Immunologic: negative   PHYSICAL EXAMINATION: General appearance: alert, cooperative, fatigued and no distress Head: Normocephalic, without obvious abnormality, atraumatic Neck: no adenopathy, no JVD, supple, symmetrical, trachea midline and thyroid not enlarged, symmetric, no tenderness/mass/nodules Lymph nodes: Cervical, supraclavicular, and axillary nodes normal. Resp: clear to auscultation bilaterally Back: symmetric, no curvature. ROM normal. No CVA tenderness. Cardio: regular rate and rhythm, S1, S2 normal, no murmur, click, rub or gallop GI: soft, non-tender; bowel sounds normal; no masses,  no organomegaly Extremities: extremities normal, atraumatic, no cyanosis or edema Neurologic: Alert and oriented X 3, normal strength and tone. Normal symmetric reflexes. Normal coordination and gait  ECOG PERFORMANCE STATUS: 1 - Symptomatic but completely ambulatory  Blood pressure 108/74, pulse 80, temperature 96.8 F (36 C), temperature source Oral, resp. rate 17, height 5\' 8"  (1.727 m), weight 163 lb 8 oz (74.163 kg), SpO2 96.00%.  LABORATORY DATA: Lab Results  Component Value Date   WBC 9.2 04/22/2013   HGB 10.0* 04/22/2013   HCT 30.1* 04/22/2013   MCV 88.3 04/22/2013   PLT 294 04/22/2013      Chemistry      Component Value Date/Time   NA 133* 04/22/2013 1130   NA 131* 04/15/2013 0500  K 4.4 04/22/2013 1130   K 4.1 04/15/2013 0500   CL 96 04/15/2013 0500   CO2 26 04/22/2013 1130   CO2 22 04/15/2013 0500   BUN 19.3 04/22/2013 1130   BUN 17 04/15/2013 0500   CREATININE 1.3 04/22/2013 1130   CREATININE 1.27 04/15/2013 0500      Component Value Date/Time   CALCIUM 9.9 04/22/2013 1130   CALCIUM 8.4 04/15/2013 0500   ALKPHOS 52 04/22/2013 1130   ALKPHOS 42 04/15/2013 0500   AST 18 04/22/2013 1130   AST 15 04/15/2013 0500   ALT 13 04/22/2013 1130   ALT 9 04/15/2013 0500   BILITOT 0.43 04/22/2013 1130   BILITOT 0.5  04/15/2013 0500       RADIOGRAPHIC STUDIES: Ct Soft Tissue Neck W Contrast  03/24/2013   CLINICAL DATA:  79 year old male with lung cancer. Dysphagia, cough, shortness of breath, nausea. Radiation completed and chemotherapy in progress. Subsequent encounter.  EXAM: CT NECK WITH CONTRAST  TECHNIQUE: Multidetector CT imaging of the neck was performed using the standard protocol following the bolus administration of intravenous contrast.  CONTRAST:  OMNIPAQUE IOHEXOL 300 MG/ML SOLN in conjunction with CT(s) of the chest, abdomen, and pelvis reported separately.  COMPARISON:  PET-CT 12/18/2012.  FINDINGS: Chest findings are reported separately.  Bilateral level 4 / thoracic inlet lymphadenopathy re- identified, mildly greater on the right. Individual nodes measure up to 11 mm short axis. No significant change allowing for differences in imaging technique since 12/18/2012.  Smaller, rounded right level 3 nodes measuring up to 6 mm also appear not significantly changed.  No level 2 or level 1 lymphadenopathy. No level 5 lymphadenopathy.  Negative nasopharynx and parapharyngeal spaces. Negative retropharyngeal space. Motion artifact at the oral pharynx and hypopharynx which appear within normal limits. Larynx remarkable for left vocal cord paralysis. No laryngeal mass. Thyroid within normal limits.  Major vascular structures in the neck and at the skullbase are patent. Visualized orbit soft tissues are within normal limits. Grossly negative visualized brain parenchyma. Calcified atherosclerosis at the skull base. Visualized paranasal sinuses and mastoids are clear.  TMJ degeneration on the right. Advanced degenerative changes in the cervical spine. No acute or suspicious osseous lesion identified in the neck.  IMPRESSION: 1. Malignant bilateral level 4/thoracic inlet lymphadenopathy not significantly changed since 12/18/2012. Smaller abnormal right level 3 nodes also stable. No new or progressed metastatic  disease identified in the neck.  2. Chest abdomen and pelvis reported separately.   Electronically Signed   By: Augusto Gamble M.D.   On: 03/24/2013 14:11   Ct Chest W Contrast  03/24/2013   CLINICAL DATA:  Metastatic non-small cell adenocarcinoma of the left upper lobe with bilateral pulmonary nodules and extensive mediastinal in supraclavicular adenopathy, diagnosed in July 2014. Prior palliative radiotherapy and current systemic chemotherapy  EXAM: CT CHEST, ABDOMEN, AND PELVIS WITH CONTRAST  TECHNIQUE: Multidetector CT imaging of the chest, abdomen and pelvis was performed following the standard protocol during bolus administration of intravenous contrast.  CONTRAST:  OMNIPAQUE IOHEXOL 300 MG/ML  SOLN  COMPARISON:  12/18/2012.  FINDINGS: CT CHEST FINDINGS  Left supraclavicular node 1.2 cm in short axis, formerly 1.4 cm. Indistinct right paratracheal node 1.8 cm in short axis, image 17 of series 2, previously 2.4 cm. Indistinct left lower paratracheal node 2.0 cm in short axis, currently 1.4 cm. AP window lymph node 1.4 cm in short axis on image 23 of series 2, formerly 1.8 cm.  Moderate to large pericardial effusion  is nonetheless reduced in size compared to prior exam.  Dense coronary artery atherosclerosis.  Severe emphysema noted. Dominant left upper lobe mass Measures 3.9 x 3.0 cm (Formerly 3.7 x 2.6 cm by my measurements) but is clearly cavitary on today's exam with internal fluid and gas density and little in the way of internal enhancement. Previously this lesion had a more solid appearance.  0.9 cm nodule posteriorly in the left upper lobe appears much less solid than it did previously, and previously was 1.0 cm in diameter.  Scattered nodules in both lungs are again noted, some spiculated, some sharply defined, and some of ground-glass signal intensity. For the most part these nodules appear stable, although some appear slightly different, including the prior 1.3 cm a right middle lobe cavitary  nodule which no has more of a ground-glass appearance on image 47 of series 7, but with similar overall size. I do not observe a definite new nodule.  CT ABDOMEN AND PELVIS FINDINGS  Breathing motion artifact noted. Mild elevation of the left hemidiaphragm.  The liver, spleen, pancreas, and adrenal glands appear unremarkable. No definite nodularity is identified to correlate with the hypermetabolic activity noted along the inferior margin of the left adrenal gland on prior PET-CT. Bilateral renal cysts observed, including a 4.8 x 4.3 cm left kidney upper pole cyst which probably has very faint internal septation and accordingly qualifies as a Bosniak category 2 cyst. There is mild heterogeneity of enhancement in the left kidney lower pole which could reflect a localized vascular or inflammatory abnormality.  Fusiform infrarenal abdominal aortic aneurysm with considerable mural thrombus, measuring 7.2 cm anterior-posterior by 7.9 cm transverse. By my measurements this was previously 7.1 x 7.4 cm. The mural thrombus appears to have higher density peripherally, and this could conceivably represent a subacute component of the mural thrombus or possibly even subacute intramural hematoma. No obvious retroperitoneal leak. The aneurysm extends down to the bifurcation or both common iliac arteries are ectatic.  Bilateral inguinal hernias contain adipose tissue. Bilateral hip implants noted.  Lumbar spondylosis and degenerative disc disease with multilevel osseous foraminal narrowing noted.  IMPRESSION: CT CHEST IMPRESSION  1. The overall pattern is of mild improvement, although many of the pulmonary nodules have not significantly changed. The dominant pulmonary mass in the left upper lobe actually measures slightly larger, but is clearly necrotic internally, with internal gas and fluid density. Measured adenopathy has all improved mildly. 2. Moderate to large pericardial effusion, reduced in size compared to prior exam. 3.  Atherosclerosis. 4. Emphysema.  CT ABDOMEN AND PELVIS IMPRESSION  1. Prior nodularity of the left adrenal gland is no longer appreciated. 2. Increase in size of infrarenal abdominal aortic aneurysm. Possible subacute component of the mural thrombus given the mixed density of the mural thrombus. Subacute intramural hematoma is a differential diagnostic consideration. No obvious extra vascular leak although the size of the aneurysm and mixed density of the mural thrombus does plate place the patient at risk for aneurysm rupture. 3. Lumbar spondylosis and degenerative disc disease. 4. Mild elevation of left hemidiaphragm. 5. Mildly heterogeneous enhancement in portions of the left kidney lower pole could reflect inflammation or avascular abnormality. Correlate with urine analysis to exclude the possibility of early pyelonephritis.   Electronically Signed   By: Herbie Baltimore M.D.   On: 03/24/2013 14:57   Ct Abdomen Pelvis W Contrast  03/24/2013   CLINICAL DATA:  Metastatic non-small cell adenocarcinoma of the left upper lobe with bilateral pulmonary nodules and extensive  mediastinal in supraclavicular adenopathy, diagnosed in July 2014. Prior palliative radiotherapy and current systemic chemotherapy  EXAM: CT CHEST, ABDOMEN, AND PELVIS WITH CONTRAST  TECHNIQUE: Multidetector CT imaging of the chest, abdomen and pelvis was performed following the standard protocol during bolus administration of intravenous contrast.  CONTRAST:  OMNIPAQUE IOHEXOL 300 MG/ML  SOLN  COMPARISON:  12/18/2012.  FINDINGS: CT CHEST FINDINGS  Left supraclavicular node 1.2 cm in short axis, formerly 1.4 cm. Indistinct right paratracheal node 1.8 cm in short axis, image 17 of series 2, previously 2.4 cm. Indistinct left lower paratracheal node 2.0 cm in short axis, currently 1.4 cm. AP window lymph node 1.4 cm in short axis on image 23 of series 2, formerly 1.8 cm.  Moderate to large pericardial effusion is nonetheless reduced in size  compared to prior exam.  Dense coronary artery atherosclerosis.  Severe emphysema noted. Dominant left upper lobe mass Measures 3.9 x 3.0 cm (Formerly 3.7 x 2.6 cm by my measurements) but is clearly cavitary on today's exam with internal fluid and gas density and little in the way of internal enhancement. Previously this lesion had a more solid appearance.  0.9 cm nodule posteriorly in the left upper lobe appears much less solid than it did previously, and previously was 1.0 cm in diameter.  Scattered nodules in both lungs are again noted, some spiculated, some sharply defined, and some of ground-glass signal intensity. For the most part these nodules appear stable, although some appear slightly different, including the prior 1.3 cm a right middle lobe cavitary nodule which no has more of a ground-glass appearance on image 47 of series 7, but with similar overall size. I do not observe a definite new nodule.  CT ABDOMEN AND PELVIS FINDINGS  Breathing motion artifact noted. Mild elevation of the left hemidiaphragm.  The liver, spleen, pancreas, and adrenal glands appear unremarkable. No definite nodularity is identified to correlate with the hypermetabolic activity noted along the inferior margin of the left adrenal gland on prior PET-CT. Bilateral renal cysts observed, including a 4.8 x 4.3 cm left kidney upper pole cyst which probably has very faint internal septation and accordingly qualifies as a Bosniak category 2 cyst. There is mild heterogeneity of enhancement in the left kidney lower pole which could reflect a localized vascular or inflammatory abnormality.  Fusiform infrarenal abdominal aortic aneurysm with considerable mural thrombus, measuring 7.2 cm anterior-posterior by 7.9 cm transverse. By my measurements this was previously 7.1 x 7.4 cm. The mural thrombus appears to have higher density peripherally, and this could conceivably represent a subacute component of the mural thrombus or possibly even  subacute intramural hematoma. No obvious retroperitoneal leak. The aneurysm extends down to the bifurcation or both common iliac arteries are ectatic.  Bilateral inguinal hernias contain adipose tissue. Bilateral hip implants noted.  Lumbar spondylosis and degenerative disc disease with multilevel osseous foraminal narrowing noted.  IMPRESSION: CT CHEST IMPRESSION  1. The overall pattern is of mild improvement, although many of the pulmonary nodules have not significantly changed. The dominant pulmonary mass in the left upper lobe actually measures slightly larger, but is clearly necrotic internally, with internal gas and fluid density. Measured adenopathy has all improved mildly. 2. Moderate to large pericardial effusion, reduced in size compared to prior exam. 3. Atherosclerosis. 4. Emphysema.  CT ABDOMEN AND PELVIS IMPRESSION  1. Prior nodularity of the left adrenal gland is no longer appreciated. 2. Increase in size of infrarenal abdominal aortic aneurysm. Possible subacute component of the mural  thrombus given the mixed density of the mural thrombus. Subacute intramural hematoma is a differential diagnostic consideration. No obvious extra vascular leak although the size of the aneurysm and mixed density of the mural thrombus does plate place the patient at risk for aneurysm rupture. 3. Lumbar spondylosis and degenerative disc disease. 4. Mild elevation of left hemidiaphragm. 5. Mildly heterogeneous enhancement in portions of the left kidney lower pole could reflect inflammation or avascular abnormality. Correlate with urine analysis to exclude the possibility of early pyelonephritis.   Electronically Signed   By: Herbie Baltimore M.D.   On: 03/24/2013 14:57   Dg Chest Portable 1 View  03/16/2013   CLINICAL DATA:  New onset atrial fibrillation, history hypertension, hyperlipidemia, lung cancer, former smoker, initial encounter  EXAM: PORTABLE CHEST - 1 VIEW  COMPARISON:  Portable exam 0853 hr compared to  03/03/2013  FINDINGS: Chronic elevation left diaphragm.  Borderline enlargement of cardiac silhouette.  Calcified tortuous aorta.  Pulmonary vascularity normal.  Chronic accentuation of interstitial markings in the mid to lower lungs similar to previous exam.  No definite acute infiltrate, pleural effusion or pneumothorax.  Question minimal chronic left basilar atelectasis.  Bones demineralized.  IMPRESSION: Suspect minimal chronic left basilar atelectasis with chronic elevation of left diaphragm.  No acute abnormalities.   Electronically Signed   By: Ulyses Southward M.D.   On: 03/16/2013 09:06   Dg Chest Port 1 View  03/03/2013   CLINICAL DATA:  79 year old male dizziness. History of lung cancer.  EXAM: PORTABLE CHEST - 1 VIEW  COMPARISON:  01/25/2013 and earlier.  FINDINGS: Portable AP semi upright view at 0807 hrs. Decreased left lung volume. No significant change in increased left suprahilar density and spiculations extending into the left apex. No pneumothorax or pulmonary edema. No pleural effusion. Stable cardiac size and mediastinal contours. Visualized tracheal air column is within normal limits.  IMPRESSION: Decreased left lung volume. This might be treatment related if the patient has undergone XRT for the left lung mass. No superimposed pneumothorax, edema, or pleural effusion.   Electronically Signed   By: Augusto Gamble M.D.   On: 03/03/2013 08:29    ASSESSMENT AND PLAN: This is a very pleasant 79 years old white male with metastatic non-small cell lung cancer, adenocarcinoma with negative EGFR mutation and negative ALK gene translocation currently on systemic chemotherapy with carboplatin and Alimta status post 3 cycles. 1) metastatic non-small cell lung cancer: He has some improvement in his disease on the recent scan of the chest, abdomen and pelvis and a stable lymphadenopathy in the neck area. He is now status post abdominal aortic aneurysm repair. He is scheduled to followup with Dr. Leonides Sake on  05/22/2013 for postop visit. The patient was discussed with an also seen by Dr. Arbutus Ped. We both had a very lengthy discussion with Mr. Shular and his family members regarding his presenting complaints as well as his treatment options. One option would be for him to proceed with another 3 cycles of systemic chemotherapy with carboplatin and Alimta versus proceeding with maintenance chemotherapy with single agent Alimta going forward. The patient and his family had several questions and all were answered to their satisfaction. Mr. Sandiford will give his treatment options some thought and will contact us with his decision within the next week to 10 days.  2) depression: the patient is to continue on low-dose Remeron 15 mg by mouth each bedtime. He is encouraged to be compliant with his medication 4) cough:  the patient  will continue on Hycodan 5 ML by mouth every 6 hours as needed.  The patient and his family had several questions and I spent almost 60 minutes with them in discussion of his current condition and treatment plan.  Conni Slipper, PA-C   The patient voices understanding of current disease status and treatment options and is in agreement with the current care plan.  All questions were answered. The patient knows to call the clinic with any problems, questions or concerns. We can certainly see the patient much sooner if necessary.  I spent 60 minutes counseling the patient face to face. The total time spent in the appointment was 70 minutes.  ADDENDUM: Hematology/Oncology Attending: I had the face to face encounter with the patient. I recommended his care plan. The patient is a very pleasant 79 years old white male with history of metastatic non-small cell lung cancer, adenocarcinoma status post 3 cycles of systemic chemotherapy with carboplatin and Alimta with some improvement in his disease and no evidence for disease progression. The patient was recently found to have an enlarging  abdominal aortic aneurysm and he recently underwent aneurysmal repair under the care of Dr. Imogene Burn. He recovered very well from his recent surgery. The patient is here today for evaluation and discussion of his treatment options the patient and his family had several questions about his current condition and the best approach for treatment of his condition. I spent a long time with the patient and his family discussing these options. I gave him the option of resuming treatment with carboplatin and Alimta for 3 more cycles, but the patient has several issues with this approach especially with increasing fatigue and weakness. He was also given him the option of switching to maintenance chemotherapy with single agent Alimta 500 mg/M2 every 3 weeks. The patient was also given him the option of observation and palliative care. He requested some time to think about these option with his family and he will call with his decision in the next few days. For the patient and advice the patient to resume his treatment with Remeron. The patient and his family were given the time to ask questions and I answered them completely to their satisfaction. He was advised to call immediately if he has any concerning symptoms in the interval. Lajuana Matte., MD 04/26/2013

## 2013-04-23 ENCOUNTER — Encounter (HOSPITAL_COMMUNITY): Payer: Self-pay | Admitting: Vascular Surgery

## 2013-04-23 ENCOUNTER — Other Ambulatory Visit: Payer: Self-pay

## 2013-04-23 NOTE — Patient Instructions (Signed)
You have been given to options regarding your of systemic chemotherapy. You will give this more thought and let us know of your answer in the next week to 10 days

## 2013-04-27 ENCOUNTER — Telehealth: Payer: Self-pay | Admitting: *Deleted

## 2013-04-27 ENCOUNTER — Other Ambulatory Visit: Payer: Self-pay | Admitting: *Deleted

## 2013-04-27 DIAGNOSIS — C3490 Malignant neoplasm of unspecified part of unspecified bronchus or lung: Secondary | ICD-10-CM

## 2013-04-27 NOTE — Telephone Encounter (Signed)
Pt stated that he has changed his mind and does not want the bone scan.  He is scheduled to see the orthopedic MD and will be assessed at that time for his back pain.  SLJ

## 2013-04-27 NOTE — Telephone Encounter (Signed)
[name] called stating pt has been having back pain for the past 3 days.  He has been taking Tylenol as needed.  He has schedule an orthopedic MD appt on 11/20 but wants to be looked at before that.  Per Dr Donnald Garre, okay to schedule a bone scan to be done to r/o any bone mets.  Natalie verbalized understanding.  SLJ

## 2013-04-27 NOTE — Telephone Encounter (Signed)
Pt is requesting an appt to discuss further with Dr Donnald Garre regarding what chemo he would like to take.  Per Dr Donnald Garre, appt made for Friday 11/14 at 9am.  Pt is aware.

## 2013-05-01 ENCOUNTER — Encounter: Payer: Self-pay | Admitting: Internal Medicine

## 2013-05-01 ENCOUNTER — Ambulatory Visit (HOSPITAL_BASED_OUTPATIENT_CLINIC_OR_DEPARTMENT_OTHER): Payer: Medicare Other | Admitting: Internal Medicine

## 2013-05-01 ENCOUNTER — Telehealth: Payer: Self-pay | Admitting: Internal Medicine

## 2013-05-01 VITALS — BP 118/74 | HR 83 | Temp 97.1°F | Resp 20 | Ht 68.0 in | Wt 161.9 lb

## 2013-05-01 DIAGNOSIS — F329 Major depressive disorder, single episode, unspecified: Secondary | ICD-10-CM

## 2013-05-01 DIAGNOSIS — R5381 Other malaise: Secondary | ICD-10-CM

## 2013-05-01 DIAGNOSIS — R0609 Other forms of dyspnea: Secondary | ICD-10-CM

## 2013-05-01 DIAGNOSIS — G8929 Other chronic pain: Secondary | ICD-10-CM

## 2013-05-01 DIAGNOSIS — C3492 Malignant neoplasm of unspecified part of left bronchus or lung: Secondary | ICD-10-CM

## 2013-05-01 DIAGNOSIS — C3410 Malignant neoplasm of upper lobe, unspecified bronchus or lung: Secondary | ICD-10-CM

## 2013-05-01 DIAGNOSIS — R05 Cough: Secondary | ICD-10-CM

## 2013-05-01 DIAGNOSIS — M549 Dorsalgia, unspecified: Secondary | ICD-10-CM

## 2013-05-01 DIAGNOSIS — M25519 Pain in unspecified shoulder: Secondary | ICD-10-CM

## 2013-05-01 MED ORDER — OXYCODONE-ACETAMINOPHEN 5-325 MG PO TABS: 1.0000 | ORAL_TABLET | Freq: Four times a day (QID) | ORAL | Status: DC | PRN

## 2013-05-01 NOTE — Progress Notes (Signed)
[facility] [phone]   [phone]  OFFICE PROGRESS NOTE  Frederich Chick, MD [address] [place] [address]  DIAGNOSIS AND STAGE: Metastatic non-small cell lung cancer, adenocarcinoma presented with left upper lobe lung mass in addition to extensive supraclavicular, mediastinal adenopathy and bilateral pulmonary nodules diagnosed in July of 2014. EGFR mutation as well as ALK gene translocation are negative.   PRIOR THERAPY: Palliative radiotherapy to the enlarged mediastinal and supraclavicular lymph nodes under the care of Dr. Kathrynn Running.   CURRENT THERAPY:  Systemic chemotherapy with carboplatin for AUC of 5 and Alimta 500 mg/M2 giving every 3 weeks. First dose given on 01/21/2013. Status post 3 cycles. The patient will resume cycle #4 on 05/07/2013 with carboplatin for AUC of 4 and Alimta 400 mg/M2 every 3 weeks.  CHEMOTHERAPY INTENT: Palliative  CURRENT # OF CHEMOTHERAPY CYCLES: 4  CURRENT ANTIEMETICS: Zofran, dexamethasone and Compazine  CURRENT SMOKING STATUS: currently a nonsmoker  ORAL CHEMOTHERAPY AND CONSENT: None  CURRENT BISPHOSPHONATES USE: None  PAIN MANAGEMENT: No pain  NARCOTICS INDUCED CONSTIPATION: N/A  LIVING WILL AND CODE STATUS: Full code   INTERVAL HISTORY: Tyler [name] 79 y.o. male returns to the clinic today for followup visit accompanied by his girlfriend and daughter. The patient is feeling fine today with no specific complaints except for the persistent fatigue as well as hoarseness of his voice and recent pain on the left shoulder. He also has chronic back pain. He takes only Tylenol for pain on as-needed basis. The patient denied having any significant chest pain but continues to have shortness breath with exertion. He has no fever or chills. He has no nausea or vomiting. He requested this appointment to discuss his treatment options again.   MEDICAL HISTORY: Past Medical History  Diagnosis  Date  . BENIGN PROSTATIC HYPERTROPHY 07/23/2008  . 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 HIP PAIN, LEFT 07/04/2007  . HYPERLIPIDEMIA 01/02/2007  . HYPERTENSION 01/02/2007  . INSOMNIA 11/16/2008  . PROSTATE SPECIFIC ANTIGEN, ELEVATED 09/10/2008  . Lung cancer     metastatic non small cell lung cancer, adenocarcinoma   . AAA (abdominal aortic aneurysm)   . Dysrhythmia     afib  . Shortness of breath   . COPD (chronic obstructive pulmonary disease)   . GERD (gastroesophageal reflux disease)   . Anemia   . History of blood transfusion     ALLERGIES:  is allergic to vicodin.  MEDICATIONS:  Current Outpatient Prescriptions  Medication Sig Dispense Refill  . cholecalciferol (VITAMIN D) 1000 UNITS tablet Take 1,000 Units by mouth every morning.       Marland Kitchen dexamethasone (DECADRON) 4 MG tablet Take 4 mg by mouth 2 (two) times daily with a meal. Take while on Chemo      . folic acid (FOLVITE) 1 MG tablet Take 1 mg by mouth every morning.      Marland Kitchen HYDROcodone-homatropine (HYCODAN) 5-1.5 MG/5ML syrup Take 5 mLs by mouth every 6 (six) hours as needed for cough.  240 mL  0  . mirtazapine (REMERON) 15 MG tablet Take 1 tablet (15 mg total) by mouth at bedtime.  30 tablet  1  . multivitamin-iron-minerals-folic acid (CENTRUM) chewable tablet Chew 1 tablet by mouth every morning.       . polyethylene glycol powder (GLYCOLAX/MIRALAX) powder Take 17 g by mouth daily as needed (for constipation).      . propranolol (INDERAL) 10 MG tablet Take 1  tablet by mouth 2 (two) times daily.      Marland Kitchen oxyCODONE-acetaminophen (ROXICET) 5-325 MG per tablet Take 1 tablet by mouth every 6 (six) hours as needed for severe pain.  30 tablet  0   No current facility-administered medications for this visit.    SURGICAL HISTORY:  Past Surgical History  Procedure Laterality Date  . Transurethral resection of prostate  2004  . Total hip arthroplasty  2010, 2011    2010- left; 2011- right  . Upper gastrointestinal endoscopy    .  Abdominal aortic endovascular stent graft N/A 04/14/2013    Procedure: ABDOMINAL AORTIC ENDOVASCULAR STENT GRAFT;  Surgeon: Fransisco Hertz, MD;  Location: [facility] OR;  Service: Vascular;  Laterality: N/A;    REVIEW OF SYSTEMS:  Constitutional: positive for anorexia and fatigue Eyes: negative Ears, nose, mouth, throat, and face: positive for hoarseness, Shortness of breath and fatigue when speaking Respiratory: positive for cough and dyspnea on exertion Cardiovascular: negative Gastrointestinal: negative Genitourinary:negative Integument/breast: negative Hematologic/lymphatic: negative Musculoskeletal:positive for left shoulder and lower back pain. Neurological: negative Behavioral/Psych: positive for anxiety, depression and fatigue Endocrine: negative Allergic/Immunologic: negative   PHYSICAL EXAMINATION: General appearance: alert, cooperative, fatigued and no distress Head: Normocephalic, without obvious abnormality, atraumatic Neck: no adenopathy, no JVD, supple, symmetrical, trachea midline and thyroid not enlarged, symmetric, no tenderness/mass/nodules Lymph nodes: Cervical, supraclavicular, and axillary nodes normal. Resp: clear to auscultation bilaterally Back: symmetric, no curvature. ROM normal. No CVA tenderness. Cardio: regular rate and rhythm, S1, S2 normal, no murmur, click, rub or gallop GI: soft, non-tender; bowel sounds normal; no masses,  no organomegaly Extremities: extremities normal, atraumatic, no cyanosis or edema Neurologic: Alert and oriented X 3, normal strength and tone. Normal symmetric reflexes. Normal coordination and gait  ECOG PERFORMANCE STATUS: 1 - Symptomatic but completely ambulatory  Blood pressure 118/74, pulse 83, temperature 97.1 F (36.2 C), temperature source Oral, resp. rate 20, height 5\' 8"  (1.727 m), weight 161 lb 14.4 oz (73.437 kg).  LABORATORY DATA: Lab Results  Component Value Date   WBC 9.2 04/22/2013   HGB 10.0* 04/22/2013   HCT 30.1*  04/22/2013   MCV 88.3 04/22/2013   PLT 294 04/22/2013      Chemistry      Component Value Date/Time   NA 133* 04/22/2013 1130   NA 131* 04/15/2013 0500   K 4.4 04/22/2013 1130   K 4.1 04/15/2013 0500   CL 96 04/15/2013 0500   CO2 26 04/22/2013 1130   CO2 22 04/15/2013 0500   BUN 19.3 04/22/2013 1130   BUN 17 04/15/2013 0500   CREATININE 1.3 04/22/2013 1130   CREATININE 1.27 04/15/2013 0500      Component Value Date/Time   CALCIUM 9.9 04/22/2013 1130   CALCIUM 8.4 04/15/2013 0500   ALKPHOS 52 04/22/2013 1130   ALKPHOS 42 04/15/2013 0500   AST 18 04/22/2013 1130   AST 15 04/15/2013 0500   ALT 13 04/22/2013 1130   ALT 9 04/15/2013 0500   BILITOT 0.43 04/22/2013 1130   BILITOT 0.5 04/15/2013 0500       RADIOGRAPHIC STUDIES:  ASSESSMENT AND PLAN: This is a very pleasant 79 years old white male with metastatic non-small cell lung cancer, adenocarcinoma with negative EGFR mutation and negative ALK gene translocation currently on systemic chemotherapy with carboplatin and Alimta status post 3 cycles. 1) metastatic non-small cell lung cancer: He has some improvement in his disease on the recent scan of the chest, abdomen and pelvis and a stable lymphadenopathy in  the neck area. He is now status post abdominal aortic aneurysm repair. I had a lengthy discussion again with Tyler [name] and his family members regarding his presenting complaints as well as his treatment options. One option would be for him to proceed with another 3 cycles of systemic chemotherapy with carboplatin and Alimta versus proceeding with maintenance chemotherapy with single agent Alimta going forward. I discussed the adverse effect of this treatment with the patient and his family including but not limited to mild alopecia, myelosuppression, nausea and vomiting, peripheral neuropathy, liver or renal dysfunction. At the end of the visit Tyler [name] would like to proceed with 3 more cycles of the chemotherapy with carboplatin and Alimta,  but I would reduce the dose of carboplatin to AUC of 4 and Alimta to 400 mg/M2 every 3 weeks because of his poor performance status and increasing fatigue. The patient is expected to start the first cycle of this treatment next week. 2) depression: he will continue on Remeron 15 mg by mouth each bedtime. 3) left shoulder and low back pain: I have a lengthy discussion with the patient and his family about this problem and I recommended for him to consider a bone scan for evaluation of these areas but the patient declined proceeding with the bone scan at this point. I will start him on Percocet 5/325 mg by mouth every 6 hours as needed for pain. 4) cough:  the patient will continue on Hycodan 5 ML by mouth every 6 hours as needed. The patient would come back for follow up visit in 4 weeks with the start of cycle #5 of his chemotherapy. The patient was advised to call immediately if he has any concerning symptoms in the interval. The patient and his family agreed to the current plan and would like to proceed with the treatment as scheduled.  I spent 30 minutes counseling the patient face to face. The total time spent in the appointment was 35 minutes.

## 2013-05-01 NOTE — Patient Instructions (Signed)
CURRENT THERAPY:  Systemic chemotherapy with carboplatin for AUC of 5 and Alimta 500 mg/M2 giving every 3 weeks. First dose given on 01/21/2013. Status post 3 cycles. The patient will resume cycle #4 on 05/07/2013 with carboplatin for AUC of 4 and Alimta 400 mg/M2 every 3 weeks.  CHEMOTHERAPY INTENT: Palliative  CURRENT # OF CHEMOTHERAPY CYCLES: 4  CURRENT ANTIEMETICS: Zofran, dexamethasone and Compazine  CURRENT SMOKING STATUS: currently a nonsmoker  ORAL CHEMOTHERAPY AND CONSENT: None  CURRENT BISPHOSPHONATES USE: None  PAIN MANAGEMENT: No pain  NARCOTICS INDUCED CONSTIPATION: N/A  LIVING WILL AND CODE STATUS: Full code  

## 2013-05-01 NOTE — Telephone Encounter (Signed)
gv and printed appt sched and avs for pt for NOV and DEc...sed added tx. °

## 2013-05-04 ENCOUNTER — Telehealth: Payer: Self-pay | Admitting: *Deleted

## 2013-05-04 NOTE — Telephone Encounter (Signed)
[name] called wanting to know if it was okay for pt to have a CT on 12/5 to eval his aneurysm and a Bone scan on 12/2.  Would this be too much exposure to the dye?  Per Dr Donnald Garre, okay for him to get these two scans at such close dates to one another.  Called and left a msg on pt home #.  SLJ

## 2013-05-05 ENCOUNTER — Ambulatory Visit (HOSPITAL_COMMUNITY): Payer: Medicare Other

## 2013-05-05 ENCOUNTER — Encounter (HOSPITAL_COMMUNITY): Payer: Medicare Other

## 2013-05-07 ENCOUNTER — Other Ambulatory Visit (HOSPITAL_BASED_OUTPATIENT_CLINIC_OR_DEPARTMENT_OTHER): Payer: Medicare Other | Admitting: Lab

## 2013-05-07 ENCOUNTER — Ambulatory Visit (HOSPITAL_BASED_OUTPATIENT_CLINIC_OR_DEPARTMENT_OTHER): Payer: Medicare Other

## 2013-05-07 ENCOUNTER — Telehealth: Payer: Self-pay | Admitting: *Deleted

## 2013-05-07 ENCOUNTER — Other Ambulatory Visit: Payer: Medicare Other | Admitting: Lab

## 2013-05-07 VITALS — BP 133/77 | HR 78 | Temp 97.0°F | Resp 18

## 2013-05-07 DIAGNOSIS — Z5111 Encounter for antineoplastic chemotherapy: Secondary | ICD-10-CM

## 2013-05-07 DIAGNOSIS — C3491 Malignant neoplasm of unspecified part of right bronchus or lung: Secondary | ICD-10-CM

## 2013-05-07 DIAGNOSIS — C3410 Malignant neoplasm of upper lobe, unspecified bronchus or lung: Secondary | ICD-10-CM

## 2013-05-07 DIAGNOSIS — C3490 Malignant neoplasm of unspecified part of unspecified bronchus or lung: Secondary | ICD-10-CM

## 2013-05-07 LAB — CBC WITH DIFFERENTIAL/PLATELET
Basophils Absolute: 0 10*3/uL (ref 0.0–0.1)
Eosinophils Absolute: 0 10*3/uL (ref 0.0–0.5)
HCT: 28.9 % — ABNORMAL LOW (ref 38.4–49.9)
HGB: 9.2 g/dL — ABNORMAL LOW (ref 13.0–17.1)
LYMPH%: 4.9 % — ABNORMAL LOW (ref 14.0–49.0)
MCV: 88.7 fL (ref 79.3–98.0)
MONO#: 0.5 10*3/uL (ref 0.1–0.9)
MONO%: 4.1 % (ref 0.0–14.0)
NEUT#: 11.4 10*3/uL — ABNORMAL HIGH (ref 1.5–6.5)
NEUT%: 90.9 % — ABNORMAL HIGH (ref 39.0–75.0)
Platelets: 235 10*3/uL (ref 140–400)
RDW: 17.1 % — ABNORMAL HIGH (ref 11.0–14.6)

## 2013-05-07 LAB — COMPREHENSIVE METABOLIC PANEL (CC13)
Albumin: 3.2 g/dL — ABNORMAL LOW (ref 3.5–5.0)
Alkaline Phosphatase: 70 U/L (ref 40–150)
BUN: 27.7 mg/dL — ABNORMAL HIGH (ref 7.0–26.0)
Calcium: 9.6 mg/dL (ref 8.4–10.4)
Chloride: 97 mEq/L — ABNORMAL LOW (ref 98–109)
Creatinine: 1.3 mg/dL (ref 0.7–1.3)
Glucose: 201 mg/dl — ABNORMAL HIGH (ref 70–140)
Potassium: 4.6 mEq/L (ref 3.5–5.1)

## 2013-05-07 MED ORDER — ONDANSETRON 16 MG/50ML IVPB (CHCC)
INTRAVENOUS | Status: AC
  Filled 2013-05-07: qty 16

## 2013-05-07 MED ORDER — SODIUM CHLORIDE 0.9 % IV SOLN
Freq: Once | INTRAVENOUS | Status: AC
  Administered 2013-05-07: 16:00:00 via INTRAVENOUS

## 2013-05-07 MED ORDER — SODIUM CHLORIDE 0.9 % IV SOLN
300.0000 mg | Freq: Once | INTRAVENOUS | Status: AC
  Administered 2013-05-07: 300 mg via INTRAVENOUS
  Filled 2013-05-07: qty 30

## 2013-05-07 MED ORDER — ONDANSETRON 16 MG/50ML IVPB (CHCC)
16.0000 mg | Freq: Once | INTRAVENOUS | Status: AC
  Administered 2013-05-07: 16 mg via INTRAVENOUS

## 2013-05-07 MED ORDER — PEMETREXED DISODIUM CHEMO INJECTION 500 MG
400.0000 mg/m2 | Freq: Once | INTRAVENOUS | Status: AC
  Administered 2013-05-07: 750 mg via INTRAVENOUS
  Filled 2013-05-07: qty 30

## 2013-05-07 MED ORDER — DEXAMETHASONE SODIUM PHOSPHATE 20 MG/5ML IJ SOLN
INTRAMUSCULAR | Status: AC
  Filled 2013-05-07: qty 5

## 2013-05-07 MED ORDER — DEXAMETHASONE SODIUM PHOSPHATE 20 MG/5ML IJ SOLN
20.0000 mg | Freq: Once | INTRAMUSCULAR | Status: AC
  Administered 2013-05-07: 20 mg via INTRAVENOUS

## 2013-05-07 NOTE — Telephone Encounter (Signed)
Received call from Dr Hyman Hopes, pt's PCP wanting to inform Dr Donnald Garre that pt has woken up with confusion and was unable to recognize Natatlie when he woke.  The confusion did not last long but Natalie and pt were concerned about the new development and wanted Dr Donnald Garre to be aware.  Pt has also not been sleeping well and Dr Hyman Hopes thought it could be delirium r/t not being able to sleep well.  Pt does not want any kind of scan performed to r/o brain mets.  Informed Dr Donnald Garre.  Either pt can have a scan of the head or he an stop the remeron and see how he does.   Spoke with [name] and patient on speaker phone.  Pt states he will stop the remeron and does not want a scan of the head right now.  Asked them to keep [place] posted on how he is doing and to call if anything changes.  SLJ

## 2013-05-09 ENCOUNTER — Other Ambulatory Visit: Payer: Self-pay | Admitting: Internal Medicine

## 2013-05-09 DIAGNOSIS — C3492 Malignant neoplasm of unspecified part of left bronchus or lung: Secondary | ICD-10-CM

## 2013-05-11 MED ORDER — HYDROCODONE-HOMATROPINE 5-1.5 MG/5ML PO SYRP: 5.0000 mL | ORAL_SOLUTION | Freq: Four times a day (QID) | ORAL | Status: DC | PRN

## 2013-05-13 ENCOUNTER — Other Ambulatory Visit (HOSPITAL_BASED_OUTPATIENT_CLINIC_OR_DEPARTMENT_OTHER): Payer: Medicare Other | Admitting: Lab

## 2013-05-13 DIAGNOSIS — C3410 Malignant neoplasm of upper lobe, unspecified bronchus or lung: Secondary | ICD-10-CM

## 2013-05-13 DIAGNOSIS — C3490 Malignant neoplasm of unspecified part of unspecified bronchus or lung: Secondary | ICD-10-CM

## 2013-05-13 LAB — COMPREHENSIVE METABOLIC PANEL (CC13)
ALT: 19 U/L (ref 0–55)
Albumin: 3.3 g/dL — ABNORMAL LOW (ref 3.5–5.0)
Anion Gap: 13 mEq/L — ABNORMAL HIGH (ref 3–11)
BUN: 33.8 mg/dL — ABNORMAL HIGH (ref 7.0–26.0)
CO2: 25 mEq/L (ref 22–29)
Calcium: 9.3 mg/dL (ref 8.4–10.4)
Glucose: 125 mg/dl (ref 70–140)
Potassium: 4.4 mEq/L (ref 3.5–5.1)
Sodium: 131 mEq/L — ABNORMAL LOW (ref 136–145)
Total Bilirubin: 0.74 mg/dL (ref 0.20–1.20)
Total Protein: 6.7 g/dL (ref 6.4–8.3)

## 2013-05-13 LAB — CBC WITH DIFFERENTIAL/PLATELET
Eosinophils Absolute: 0.4 10*3/uL (ref 0.0–0.5)
HCT: 31.3 % — ABNORMAL LOW (ref 38.4–49.9)
LYMPH%: 8.3 % — ABNORMAL LOW (ref 14.0–49.0)
MONO#: 0.1 10*3/uL (ref 0.1–0.9)
NEUT#: 5.2 10*3/uL (ref 1.5–6.5)
Platelets: 224 10*3/uL (ref 140–400)
RBC: 3.55 10*6/uL — ABNORMAL LOW (ref 4.20–5.82)
RDW: 17.2 % — ABNORMAL HIGH (ref 11.0–14.6)
WBC: 6.2 10*3/uL (ref 4.0–10.3)

## 2013-05-18 ENCOUNTER — Telehealth: Payer: Self-pay | Admitting: Medical Oncology

## 2013-05-18 NOTE — Telephone Encounter (Signed)
Tyler [name] called and stated pt cancelled his bone scan because " he is feeling better". Dr Arbutus Ped notified.

## 2013-05-19 ENCOUNTER — Encounter (HOSPITAL_COMMUNITY): Payer: Medicare Other

## 2013-05-20 ENCOUNTER — Telehealth: Payer: Self-pay | Admitting: Medical Oncology

## 2013-05-20 NOTE — Telephone Encounter (Signed)
PEr Dr Berna Bue I left a message for Mr Marling to try benadryl 12.5 -25 mg hs for insomnia.

## 2013-05-20 NOTE — Telephone Encounter (Signed)
Pt calls to report he feels good and he can fall asleep , but not stay asleep. I asked him if he is taking remeron and he said  it makes him "feel out of this world".

## 2013-05-21 ENCOUNTER — Encounter: Payer: Self-pay | Admitting: Vascular Surgery

## 2013-05-21 ENCOUNTER — Other Ambulatory Visit (HOSPITAL_BASED_OUTPATIENT_CLINIC_OR_DEPARTMENT_OTHER): Payer: Medicare Other | Admitting: Lab

## 2013-05-21 DIAGNOSIS — C3490 Malignant neoplasm of unspecified part of unspecified bronchus or lung: Secondary | ICD-10-CM

## 2013-05-21 DIAGNOSIS — C3410 Malignant neoplasm of upper lobe, unspecified bronchus or lung: Secondary | ICD-10-CM

## 2013-05-21 LAB — COMPREHENSIVE METABOLIC PANEL (CC13)
ALT: 35 U/L (ref 0–55)
AST: 25 U/L (ref 5–34)
Anion Gap: 8 mEq/L (ref 3–11)
CO2: 27 mEq/L (ref 22–29)
Calcium: 9.1 mg/dL (ref 8.4–10.4)
Chloride: 98 mEq/L (ref 98–109)
Creatinine: 1.3 mg/dL (ref 0.7–1.3)
Sodium: 133 mEq/L — ABNORMAL LOW (ref 136–145)
Total Bilirubin: 0.35 mg/dL (ref 0.20–1.20)
Total Protein: 6.5 g/dL (ref 6.4–8.3)

## 2013-05-21 LAB — CBC WITH DIFFERENTIAL/PLATELET
BASO%: 0.2 % (ref 0.0–2.0)
Basophils Absolute: 0 10*3/uL (ref 0.0–0.1)
HCT: 28.1 % — ABNORMAL LOW (ref 38.4–49.9)
MCH: 29.7 pg (ref 27.2–33.4)
MCHC: 32.7 g/dL (ref 32.0–36.0)
MONO#: 0.5 10*3/uL (ref 0.1–0.9)
NEUT#: 3 10*3/uL (ref 1.5–6.5)
NEUT%: 72.8 % (ref 39.0–75.0)
Platelets: 68 10*3/uL — ABNORMAL LOW (ref 140–400)
RBC: 3.09 10*6/uL — ABNORMAL LOW (ref 4.20–5.82)
WBC: 4.1 10*3/uL (ref 4.0–10.3)
lymph#: 0.5 10*3/uL — ABNORMAL LOW (ref 0.9–3.3)

## 2013-05-22 ENCOUNTER — Ambulatory Visit
Admission: RE | Admit: 2013-05-22 | Discharge: 2013-05-22 | Disposition: A | Discharge status: Home / Self Care | Payer: Medicare Other | Source: Ambulatory Visit | Attending: Vascular Surgery | Admitting: Vascular Surgery

## 2013-05-22 ENCOUNTER — Other Ambulatory Visit: Payer: Self-pay | Admitting: *Deleted

## 2013-05-22 ENCOUNTER — Encounter: Payer: Self-pay | Admitting: Vascular Surgery

## 2013-05-22 ENCOUNTER — Ambulatory Visit (INDEPENDENT_AMBULATORY_CARE_PROVIDER_SITE_OTHER): Payer: Medicare Other | Admitting: Vascular Surgery

## 2013-05-22 VITALS — BP 119/79 | HR 88 | Ht 68.0 in | Wt 167.0 lb

## 2013-05-22 DIAGNOSIS — I714 Abdominal aortic aneurysm, without rupture, unspecified: Secondary | ICD-10-CM | POA: Insufficient documentation

## 2013-05-22 DIAGNOSIS — Z48812 Encounter for surgical aftercare following surgery on the circulatory system: Secondary | ICD-10-CM

## 2013-05-22 MED ORDER — IOHEXOL 350 MG/ML SOLN
100.0000 mL | Freq: Once | INTRAVENOUS | Status: AC | PRN
  Administered 2013-05-22: 100 mL via INTRAVENOUS

## 2013-05-22 NOTE — Progress Notes (Signed)
[facility]  Post-operative EVAR  History of Present Illness  Tyler [name] is a 79 y.o. male who presents post-op s/p EVAR with B femoral cutdown (Date: 04/14/13).  Most recent CTA (Date: 05/22/2013) demonstrates: no endoleak and slightly decreased sac size.  The patient has not had back or abdominal pain.  He denies any bowel obstruction sx   For VQI Use Only  PRE-ADM LIVING: Home  AMB STATUS: Ambulatory  Physical Examination  Filed Vitals:   05/22/13 1240  BP: 119/79  Pulse: 88    Vascular: Vessel Right Left  Aorta Non-palpable N/A  Femoral Palpable Palpable  Popliteal Non-palpable Non-palpable  PT Not Palpable Not Palpable  DP Not Palpable Not Palpable   Gastrointestinal: soft, NTND, -G/R, - HSM, - masses, - CVAT B, B incision healed, large LIH reducible  Non-Invasive Vascular Imaging  CTA Abd & pelvis (Date: 05/22/2013) 1. Successful endovascular aortic repair of large fusiform infrarenal abdominal aortic aneurysm. No evidence of endoleak or other complication. The excluded aneurysm sac has slightly decreased in size presently measuring 7.0 x 7.8 cm compared to 7.2 x 7.9 cm on 03/24/2013.  2. Interval enlargement of left inguinal hernia now containing a loop of nonobstructed sigmoid colon. No CT findings to suggest incarceration.  3. Numerous additional ancillary findings as detailed above without significant interval change.  Based on my review of the CTA, this patient has a sucessful EVAR in adequate position without endoleak evident.  No evidence of limb dysfunction is present.  Medical Decision Making  Tyler [name] is a 79 y.o. male who presents s/p EVAR.  Pt is asymptomatic with slightly decreasing sac size.  I discussed with the patient the importance of surveillance of the endograft.  The next endograft duplex will be scheduled for 6 months.  The next CT will be scheduled for 12 months.  The patient will follow up with [place] in 6  months with these studies.  Thank you for allowing [place] to participate in this patient's care.  Leonides Sake, MD [facility] [place] Office: [phone] Pager: [phone]  05/22/2013, 1:18 PM

## 2013-05-26 ENCOUNTER — Other Ambulatory Visit: Payer: Self-pay | Admitting: Medical Oncology

## 2013-05-28 ENCOUNTER — Encounter: Payer: Self-pay | Admitting: *Deleted

## 2013-05-28 ENCOUNTER — Ambulatory Visit (HOSPITAL_BASED_OUTPATIENT_CLINIC_OR_DEPARTMENT_OTHER): Payer: Medicare Other | Admitting: Internal Medicine

## 2013-05-28 ENCOUNTER — Other Ambulatory Visit (HOSPITAL_BASED_OUTPATIENT_CLINIC_OR_DEPARTMENT_OTHER): Payer: Medicare Other

## 2013-05-28 ENCOUNTER — Telehealth: Payer: Self-pay | Admitting: Internal Medicine

## 2013-05-28 ENCOUNTER — Encounter: Payer: Self-pay | Admitting: Internal Medicine

## 2013-05-28 ENCOUNTER — Ambulatory Visit (HOSPITAL_BASED_OUTPATIENT_CLINIC_OR_DEPARTMENT_OTHER): Payer: Medicare Other

## 2013-05-28 VITALS — BP 131/82 | HR 80 | Temp 96.8°F | Resp 17 | Ht 68.0 in | Wt 164.8 lb

## 2013-05-28 DIAGNOSIS — C3491 Malignant neoplasm of unspecified part of right bronchus or lung: Secondary | ICD-10-CM

## 2013-05-28 DIAGNOSIS — C3410 Malignant neoplasm of upper lobe, unspecified bronchus or lung: Secondary | ICD-10-CM

## 2013-05-28 DIAGNOSIS — C3492 Malignant neoplasm of unspecified part of left bronchus or lung: Secondary | ICD-10-CM

## 2013-05-28 DIAGNOSIS — R0609 Other forms of dyspnea: Secondary | ICD-10-CM

## 2013-05-28 DIAGNOSIS — R5381 Other malaise: Secondary | ICD-10-CM

## 2013-05-28 DIAGNOSIS — C3490 Malignant neoplasm of unspecified part of unspecified bronchus or lung: Secondary | ICD-10-CM

## 2013-05-28 DIAGNOSIS — Z5111 Encounter for antineoplastic chemotherapy: Secondary | ICD-10-CM

## 2013-05-28 LAB — CBC WITH DIFFERENTIAL/PLATELET
BASO%: 0.2 % (ref 0.0–2.0)
Basophils Absolute: 0 10*3/uL (ref 0.0–0.1)
EOS%: 0 % (ref 0.0–7.0)
Eosinophils Absolute: 0 10*3/uL (ref 0.0–0.5)
HCT: 27.7 % — ABNORMAL LOW (ref 38.4–49.9)
HGB: 9.2 g/dL — ABNORMAL LOW (ref 13.0–17.1)
LYMPH%: 13.9 % — ABNORMAL LOW (ref 14.0–49.0)
MCHC: 33.2 g/dL (ref 32.0–36.0)
MCV: 89.6 fL (ref 79.3–98.0)
MONO#: 1.1 10*3/uL — ABNORMAL HIGH (ref 0.1–0.9)
NEUT#: 3.9 10*3/uL (ref 1.5–6.5)
NEUT%: 66.3 % (ref 39.0–75.0)
Platelets: 176 10*3/uL (ref 140–400)
WBC: 5.8 10*3/uL (ref 4.0–10.3)
lymph#: 0.8 10*3/uL — ABNORMAL LOW (ref 0.9–3.3)

## 2013-05-28 LAB — COMPREHENSIVE METABOLIC PANEL (CC13)
AST: 19 U/L (ref 5–34)
Albumin: 3.5 g/dL (ref 3.5–5.0)
Alkaline Phosphatase: 58 U/L (ref 40–150)
Anion Gap: 11 mEq/L (ref 3–11)
BUN: 23.1 mg/dL (ref 7.0–26.0)
CO2: 23 mEq/L (ref 22–29)
Calcium: 9.4 mg/dL (ref 8.4–10.4)
Chloride: 96 mEq/L — ABNORMAL LOW (ref 98–109)
Creatinine: 1.3 mg/dL (ref 0.7–1.3)
Potassium: 4.6 mEq/L (ref 3.5–5.1)

## 2013-05-28 MED ORDER — ONDANSETRON 16 MG/50ML IVPB (CHCC)
INTRAVENOUS | Status: AC
  Filled 2013-05-28: qty 16

## 2013-05-28 MED ORDER — DEXAMETHASONE SODIUM PHOSPHATE 20 MG/5ML IJ SOLN
INTRAMUSCULAR | Status: AC
  Filled 2013-05-28: qty 5

## 2013-05-28 MED ORDER — ONDANSETRON 16 MG/50ML IVPB (CHCC)
16.0000 mg | Freq: Once | INTRAVENOUS | Status: AC
  Administered 2013-05-28: 16 mg via INTRAVENOUS

## 2013-05-28 MED ORDER — DEXAMETHASONE SODIUM PHOSPHATE 20 MG/5ML IJ SOLN
20.0000 mg | Freq: Once | INTRAMUSCULAR | Status: AC
  Administered 2013-05-28: 20 mg via INTRAVENOUS

## 2013-05-28 MED ORDER — SODIUM CHLORIDE 0.9 % IV SOLN
300.0000 mg | Freq: Once | INTRAVENOUS | Status: AC
  Administered 2013-05-28: 300 mg via INTRAVENOUS
  Filled 2013-05-28: qty 30

## 2013-05-28 MED ORDER — SODIUM CHLORIDE 0.9 % IV SOLN
Freq: Once | INTRAVENOUS | Status: AC
  Administered 2013-05-28: 14:00:00 via INTRAVENOUS

## 2013-05-28 MED ORDER — SODIUM CHLORIDE 0.9 % IV SOLN
750.0000 mg | Freq: Once | INTRAVENOUS | Status: AC
  Administered 2013-05-28: 750 mg via INTRAVENOUS
  Filled 2013-05-28: qty 30

## 2013-05-28 NOTE — Progress Notes (Signed)
[facility] Telephone:([phone]   Fax:([phone]  OFFICE PROGRESS NOTE  Frederich Chick, MD [address] [place] [address]  DIAGNOSIS AND STAGE: Metastatic non-small cell lung cancer, adenocarcinoma presented with left upper lobe lung mass in addition to extensive supraclavicular, mediastinal adenopathy and bilateral pulmonary nodules diagnosed in July of 2014. EGFR mutation as well as ALK gene translocation are negative.   PRIOR THERAPY: Palliative radiotherapy to the enlarged mediastinal and supraclavicular lymph nodes under the care of Dr. Kathrynn Running.   CURRENT THERAPY:  Systemic chemotherapy with carboplatin for AUC of 5 and Alimta 500 mg/M2 giving every 3 weeks. First dose given on 01/21/2013. Status post 3 cycles. The patient will resume cycle #4 on 05/07/2013 with carboplatin for AUC of 4 and Alimta 400 mg/M2 every 3 weeks.  CHEMOTHERAPY INTENT: Palliative  CURRENT # OF CHEMOTHERAPY CYCLES: 4  CURRENT ANTIEMETICS: Zofran, dexamethasone and Compazine  CURRENT SMOKING STATUS: currently a nonsmoker  ORAL CHEMOTHERAPY AND CONSENT: None  CURRENT BISPHOSPHONATES USE: None  PAIN MANAGEMENT: No pain  NARCOTICS INDUCED CONSTIPATION: N/A  LIVING WILL AND CODE STATUS: Full code   INTERVAL HISTORY: Tyler [name] 79 y.o. male returns to the clinic today for followup visit accompanied by his girlfriend, [name]. The patient is feeling fine today with no specific complaints except for the persistent fatigue as well as hoarseness of his voice. He mentions that he is feeling very well over the last few days. He has more energy and was able to do a lot of things at home. He is still very emotional and tearful at times. He discontinued treatment with Remeron because it was messing up with his head according to the patient. He tolerated the last cycle of his chemotherapy with reduced dose carboplatin and Alimta fairly well. The patient denied having any  significant chest pain but continues to have shortness of breath with exertion. He has no fever or chills. He has no nausea or vomiting.   MEDICAL HISTORY: Past Medical History  Diagnosis Date  . BENIGN PROSTATIC HYPERTROPHY 07/23/2008  . 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 HIP PAIN, LEFT 07/04/2007  . HYPERLIPIDEMIA 01/02/2007  . HYPERTENSION 01/02/2007  . INSOMNIA 11/16/2008  . PROSTATE SPECIFIC ANTIGEN, ELEVATED 09/10/2008  . Lung cancer     metastatic non small cell lung cancer, adenocarcinoma   . AAA (abdominal aortic aneurysm)   . Dysrhythmia     afib  . Shortness of breath   . COPD (chronic obstructive pulmonary disease)   . GERD (gastroesophageal reflux disease)   . Anemia   . History of blood transfusion     ALLERGIES:  is allergic to vicodin.  MEDICATIONS:  Current Outpatient Prescriptions  Medication Sig Dispense Refill  . cholecalciferol (VITAMIN D) 1000 UNITS tablet Take 1,000 Units by mouth every morning.       . COMBIVENT RESPIMAT 20-100 MCG/ACT AERS respimat       . dexamethasone (DECADRON) 4 MG tablet Take 4 mg by mouth 2 (two) times daily with a meal. Take while on Chemo      . diltiazem (CARDIZEM) 30 MG tablet       . fluconazole (DIFLUCAN) 200 MG tablet       . folic acid (FOLVITE) 1 MG tablet Take 1 mg by mouth every morning.      Marland Kitchen HYDROcodone-homatropine (HYCODAN) 5-1.5 MG/5ML syrup Take 5 mLs by mouth every 6 (six) hours as needed for cough.  240  mL  0  . mirtazapine (REMERON) 15 MG tablet Take 1 tablet (15 mg total) by mouth at bedtime.  30 tablet  1  . multivitamin-iron-minerals-folic acid (CENTRUM) chewable tablet Chew 1 tablet by mouth every morning.       Marland Kitchen omeprazole (PRILOSEC) 20 MG capsule       . oxyCODONE-acetaminophen (ROXICET) 5-325 MG per tablet Take 1 tablet by mouth every 6 (six) hours as needed for severe pain.  30 tablet  0  . polyethylene glycol powder (GLYCOLAX/MIRALAX) powder Take 17 g by mouth daily as needed (for constipation).        . propranolol (INDERAL) 10 MG tablet Take 1 tablet by mouth 2 (two) times daily.       No current facility-administered medications for this visit.    SURGICAL HISTORY:  Past Surgical History  Procedure Laterality Date  . Transurethral resection of prostate  2004  . Total hip arthroplasty  2010, 2011    2010- left; 2011- right  . Upper gastrointestinal endoscopy    . Abdominal aortic endovascular stent graft N/A 04/14/2013    Procedure: ABDOMINAL AORTIC ENDOVASCULAR STENT GRAFT;  Surgeon: Fransisco Hertz, MD;  Location: [facility] OR;  Service: Vascular;  Laterality: N/A;    REVIEW OF SYSTEMS:  Constitutional: positive for anorexia and fatigue Eyes: negative Ears, nose, mouth, throat, and face: positive for hoarseness.  Respiratory: positive for cough and dyspnea on exertion Cardiovascular: negative Gastrointestinal: negative Genitourinary:negative Integument/breast: negative Hematologic/lymphatic: negative Musculoskeletal:positive for left shoulder and lower back pain. Neurological: negative Behavioral/Psych: positive for anxiety, depression and fatigue Endocrine: negative Allergic/Immunologic: negative   PHYSICAL EXAMINATION: General appearance: alert, cooperative, fatigued and no distress Head: Normocephalic, without obvious abnormality, atraumatic Neck: no adenopathy, no JVD, supple, symmetrical, trachea midline and thyroid not enlarged, symmetric, no tenderness/mass/nodules Lymph nodes: Cervical, supraclavicular, and axillary nodes normal. Resp: clear to auscultation bilaterally Back: symmetric, no curvature. ROM normal. No CVA tenderness. Cardio: regular rate and rhythm, S1, S2 normal, no murmur, click, rub or gallop GI: soft, non-tender; bowel sounds normal; no masses,  no organomegaly Extremities: extremities normal, atraumatic, no cyanosis or edema Neurologic: Alert and oriented X 3, normal strength and tone. Normal symmetric reflexes. Normal coordination and gait  ECOG  PERFORMANCE STATUS: 1 - Symptomatic but completely ambulatory  Blood pressure 131/82, pulse 80, temperature 96.8 F (36 C), temperature source Oral, resp. rate 17, height 5\' 8"  (1.727 m), weight 164 lb 12.8 oz (74.753 kg).  LABORATORY DATA: Lab Results  Component Value Date   WBC 5.8 05/28/2013   HGB 9.2* 05/28/2013   HCT 27.7* 05/28/2013   MCV 89.6 05/28/2013   PLT 176 05/28/2013      Chemistry      Component Value Date/Time   NA 133* 05/21/2013 1111   NA 131* 04/15/2013 0500   K 4.5 05/21/2013 1111   K 4.1 04/15/2013 0500   CL 96 04/15/2013 0500   CO2 27 05/21/2013 1111   CO2 22 04/15/2013 0500   BUN 18.5 05/21/2013 1111   BUN 17 04/15/2013 0500   CREATININE 1.3 05/21/2013 1111   CREATININE 1.27 04/15/2013 0500      Component Value Date/Time   CALCIUM 9.1 05/21/2013 1111   CALCIUM 8.4 04/15/2013 0500   ALKPHOS 72 05/21/2013 1111   ALKPHOS 42 04/15/2013 0500   AST 25 05/21/2013 1111   AST 15 04/15/2013 0500   ALT 35 05/21/2013 1111   ALT 9 04/15/2013 0500   BILITOT 0.35 05/21/2013 1111   BILITOT  0.5 04/15/2013 0500       RADIOGRAPHIC STUDIES:  ASSESSMENT AND PLAN: This is a very pleasant 79 years old white male with metastatic non-small cell lung cancer, adenocarcinoma with negative EGFR mutation and negative ALK gene translocation currently on systemic chemotherapy with carboplatin and Alimta status post 4 cycles. He is currently on reduced dose carboplatin for AUC of 4 and Alimta at 400 mg/M2 starting from cycle 4 and tolerating it fairly well. He is currently on reduced dose chemotherapy with carboplatin for AUC of 4 and Alimta 500 mg/M2 starting from cycle #4 and tolerating it fairly well. The patient is doing very well today and is starting being very active over the last few weeks. I recommended for the patient to proceed with cycle #5 today as scheduled. He would come back for followup visit in 3 weeks with the next cycle of his treatment. The patient was advised to  call immediately if he has any concerning symptoms in the interval.  I spent 30 minutes counseling the patient face to face. The total time spent in the appointment was 35 minutes.

## 2013-05-28 NOTE — Telephone Encounter (Signed)
gv and printed appt sched and avs for pt for DEC adn Jan 2015....sedadded tx. °

## 2013-05-28 NOTE — Patient Instructions (Signed)
[facility] Discharge Instructions for Patients Receiving Chemotherapy  Today you received the following chemotherapy agents Alimta/Carboplatin.  To help prevent nausea and vomiting after your treatment, we encourage you to take your nausea medication. None are ordered, please call the clinic if any are needed.   If you develop nausea and vomiting that is not controlled by your nausea medication, call the clinic.   BELOW ARE SYMPTOMS THAT SHOULD BE REPORTED IMMEDIATELY:  *FEVER GREATER THAN 100.5 F  *CHILLS WITH OR WITHOUT FEVER  NAUSEA AND VOMITING THAT IS NOT CONTROLLED WITH YOUR NAUSEA MEDICATION  *UNUSUAL SHORTNESS OF BREATH  *UNUSUAL BRUISING OR BLEEDING  TENDERNESS IN MOUTH AND THROAT WITH OR WITHOUT PRESENCE OF ULCERS  *URINARY PROBLEMS  *BOWEL PROBLEMS  UNUSUAL RASH Items with * indicate a potential emergency and should be followed up as soon as possible.  Feel free to call the clinic you have any questions or concerns. The clinic phone number is [phone].

## 2013-05-29 NOTE — Patient Instructions (Signed)
CURRENT THERAPY:  Systemic chemotherapy with carboplatin for AUC of 5 and Alimta 500 mg/M2 giving every 3 weeks. First dose given on 01/21/2013. Status post 3 cycles. The patient will resume cycle #4 on 05/07/2013 with carboplatin for AUC of 4 and Alimta 400 mg/M2 every 3 weeks.  CHEMOTHERAPY INTENT: Palliative  CURRENT # OF CHEMOTHERAPY CYCLES: 4  CURRENT ANTIEMETICS: Zofran, dexamethasone and Compazine  CURRENT SMOKING STATUS: currently a nonsmoker  ORAL CHEMOTHERAPY AND CONSENT: None  CURRENT BISPHOSPHONATES USE: None  PAIN MANAGEMENT: No pain  NARCOTICS INDUCED CONSTIPATION: N/A  LIVING WILL AND CODE STATUS: Full code

## 2013-06-01 ENCOUNTER — Telehealth: Payer: Self-pay | Admitting: *Deleted

## 2013-06-01 DIAGNOSIS — C3490 Malignant neoplasm of unspecified part of unspecified bronchus or lung: Secondary | ICD-10-CM

## 2013-06-01 MED ORDER — MAGIC MOUTHWASH W/LIDOCAINE: 5.0000 mL | Freq: Four times a day (QID) | ORAL | Status: DC | PRN

## 2013-06-01 NOTE — Telephone Encounter (Signed)
[name] called and stated that pt has a tender mouth and is having pain after he swallows.  Per Dr Donnald Garre, okay to give magic mouthwash with lidocain QID PRN swish and spit and he can take prilosec OTC.  Natalie verbalized understanding.  SLJ

## 2013-06-04 ENCOUNTER — Other Ambulatory Visit (HOSPITAL_BASED_OUTPATIENT_CLINIC_OR_DEPARTMENT_OTHER): Payer: Medicare Other

## 2013-06-04 DIAGNOSIS — C3410 Malignant neoplasm of upper lobe, unspecified bronchus or lung: Secondary | ICD-10-CM

## 2013-06-04 DIAGNOSIS — C3490 Malignant neoplasm of unspecified part of unspecified bronchus or lung: Secondary | ICD-10-CM

## 2013-06-04 LAB — COMPREHENSIVE METABOLIC PANEL (CC13)
ALT: 20 U/L (ref 0–55)
AST: 18 U/L (ref 5–34)
Albumin: 3.4 g/dL — ABNORMAL LOW (ref 3.5–5.0)
Alkaline Phosphatase: 71 U/L (ref 40–150)
BUN: 35.3 mg/dL — ABNORMAL HIGH (ref 7.0–26.0)
Calcium: 9 mg/dL (ref 8.4–10.4)
Chloride: 99 mEq/L (ref 98–109)
Potassium: 4.6 mEq/L (ref 3.5–5.1)
Sodium: 132 mEq/L — ABNORMAL LOW (ref 136–145)
Total Bilirubin: 0.42 mg/dL (ref 0.20–1.20)
Total Protein: 6.3 g/dL — ABNORMAL LOW (ref 6.4–8.3)

## 2013-06-04 LAB — CBC WITH DIFFERENTIAL/PLATELET
Basophils Absolute: 0 10*3/uL (ref 0.0–0.1)
EOS%: 1.4 % (ref 0.0–7.0)
HGB: 9.3 g/dL — ABNORMAL LOW (ref 13.0–17.1)
MCH: 30.8 pg (ref 27.2–33.4)
MCV: 92.3 fL (ref 79.3–98.0)
MONO%: 5.2 % (ref 0.0–14.0)
NEUT#: 1.3 10*3/uL — ABNORMAL LOW (ref 1.5–6.5)
NEUT%: 70.8 % (ref 39.0–75.0)
RBC: 3.02 10*6/uL — ABNORMAL LOW (ref 4.20–5.82)
RDW: 20.9 % — ABNORMAL HIGH (ref 11.0–14.6)
lymph#: 0.4 10*3/uL — ABNORMAL LOW (ref 0.9–3.3)

## 2013-06-10 ENCOUNTER — Other Ambulatory Visit (HOSPITAL_BASED_OUTPATIENT_CLINIC_OR_DEPARTMENT_OTHER): Payer: Medicare Other

## 2013-06-10 ENCOUNTER — Telehealth: Payer: Self-pay | Admitting: Medical Oncology

## 2013-06-10 DIAGNOSIS — C3490 Malignant neoplasm of unspecified part of unspecified bronchus or lung: Secondary | ICD-10-CM

## 2013-06-10 DIAGNOSIS — C3410 Malignant neoplasm of upper lobe, unspecified bronchus or lung: Secondary | ICD-10-CM

## 2013-06-10 LAB — CBC WITH DIFFERENTIAL/PLATELET
BASO%: 0.2 % (ref 0.0–2.0)
EOS%: 0.5 % (ref 0.0–7.0)
Eosinophils Absolute: 0 10*3/uL (ref 0.0–0.5)
LYMPH%: 14.3 % (ref 14.0–49.0)
MCH: 29.9 pg (ref 27.2–33.4)
MCHC: 32.2 g/dL (ref 32.0–36.0)
MCV: 92.8 fL (ref 79.3–98.0)
MONO%: 16.2 % — ABNORMAL HIGH (ref 0.0–14.0)
NEUT%: 68.8 % (ref 39.0–75.0)
Platelets: 56 10*3/uL — ABNORMAL LOW (ref 140–400)
RBC: 3.1 10*6/uL — ABNORMAL LOW (ref 4.20–5.82)
RDW: 20.9 % — ABNORMAL HIGH (ref 11.0–14.6)

## 2013-06-10 LAB — COMPREHENSIVE METABOLIC PANEL (CC13)
AST: 23 U/L (ref 5–34)
Alkaline Phosphatase: 66 U/L (ref 40–150)
Creatinine: 1.4 mg/dL — ABNORMAL HIGH (ref 0.7–1.3)
Glucose: 118 mg/dl (ref 70–140)
Sodium: 135 mEq/L — ABNORMAL LOW (ref 136–145)
Total Bilirubin: 0.48 mg/dL (ref 0.20–1.20)
Total Protein: 6.7 g/dL (ref 6.4–8.3)

## 2013-06-10 NOTE — Telephone Encounter (Signed)
Pt scheduled for lab today and confirmed appt with [facility].

## 2013-06-12 ENCOUNTER — Telehealth: Payer: Self-pay | Admitting: Internal Medicine

## 2013-06-12 NOTE — Telephone Encounter (Signed)
s.w. pt and advisedo n 1.2.14 appt changed.Marland KitchenMarland Kitchen

## 2013-06-12 NOTE — Telephone Encounter (Signed)
s.w. pt and advised on d.t. change on 1.2.14.Marland KitchenMarland Kitchenpt ok and aware...pt MD request on day off

## 2013-06-19 ENCOUNTER — Other Ambulatory Visit (HOSPITAL_BASED_OUTPATIENT_CLINIC_OR_DEPARTMENT_OTHER): Payer: Medicare Other

## 2013-06-19 ENCOUNTER — Other Ambulatory Visit: Payer: Medicare Other

## 2013-06-19 ENCOUNTER — Encounter: Payer: Self-pay | Admitting: Internal Medicine

## 2013-06-19 ENCOUNTER — Ambulatory Visit (HOSPITAL_BASED_OUTPATIENT_CLINIC_OR_DEPARTMENT_OTHER): Payer: Medicare Other | Admitting: Internal Medicine

## 2013-06-19 ENCOUNTER — Ambulatory Visit (HOSPITAL_BASED_OUTPATIENT_CLINIC_OR_DEPARTMENT_OTHER): Payer: Medicare Other

## 2013-06-19 ENCOUNTER — Ambulatory Visit: Payer: Medicare Other | Admitting: Internal Medicine

## 2013-06-19 ENCOUNTER — Ambulatory Visit: Payer: Medicare Other

## 2013-06-19 ENCOUNTER — Telehealth: Payer: Self-pay | Admitting: Internal Medicine

## 2013-06-19 VITALS — BP 140/82 | HR 79 | Temp 96.5°F | Resp 18 | Ht 68.0 in | Wt 168.9 lb

## 2013-06-19 DIAGNOSIS — C3410 Malignant neoplasm of upper lobe, unspecified bronchus or lung: Secondary | ICD-10-CM

## 2013-06-19 DIAGNOSIS — R0609 Other forms of dyspnea: Secondary | ICD-10-CM

## 2013-06-19 DIAGNOSIS — R5383 Other fatigue: Secondary | ICD-10-CM

## 2013-06-19 DIAGNOSIS — R5381 Other malaise: Secondary | ICD-10-CM

## 2013-06-19 DIAGNOSIS — C3491 Malignant neoplasm of unspecified part of right bronchus or lung: Secondary | ICD-10-CM

## 2013-06-19 DIAGNOSIS — R0989 Other specified symptoms and signs involving the circulatory and respiratory systems: Secondary | ICD-10-CM

## 2013-06-19 DIAGNOSIS — Z5111 Encounter for antineoplastic chemotherapy: Secondary | ICD-10-CM

## 2013-06-19 DIAGNOSIS — C3492 Malignant neoplasm of unspecified part of left bronchus or lung: Secondary | ICD-10-CM

## 2013-06-19 DIAGNOSIS — C3490 Malignant neoplasm of unspecified part of unspecified bronchus or lung: Secondary | ICD-10-CM

## 2013-06-19 LAB — CBC WITH DIFFERENTIAL/PLATELET
BASO%: 0.2 % (ref 0.0–2.0)
BASOS ABS: 0 10*3/uL (ref 0.0–0.1)
EOS%: 0 % (ref 0.0–7.0)
Eosinophils Absolute: 0 10*3/uL (ref 0.0–0.5)
HCT: 28.6 % — ABNORMAL LOW (ref 38.4–49.9)
HEMOGLOBIN: 9.4 g/dL — AB (ref 13.0–17.1)
LYMPH#: 0.8 10*3/uL — AB (ref 0.9–3.3)
LYMPH%: 12.8 % — ABNORMAL LOW (ref 14.0–49.0)
MCH: 30.4 pg (ref 27.2–33.4)
MCHC: 32.9 g/dL (ref 32.0–36.0)
MCV: 92.6 fL (ref 79.3–98.0)
MONO#: 0.9 10*3/uL (ref 0.1–0.9)
MONO%: 13.8 % (ref 0.0–14.0)
NEUT#: 4.6 10*3/uL (ref 1.5–6.5)
NEUT%: 73.2 % (ref 39.0–75.0)
Platelets: 197 10*3/uL (ref 140–400)
RBC: 3.09 10*6/uL — ABNORMAL LOW (ref 4.20–5.82)
RDW: 22.7 % — AB (ref 11.0–14.6)
WBC: 6.3 10*3/uL (ref 4.0–10.3)

## 2013-06-19 LAB — COMPREHENSIVE METABOLIC PANEL (CC13)
ALBUMIN: 3.8 g/dL (ref 3.5–5.0)
ALT: 25 U/L (ref 0–55)
AST: 20 U/L (ref 5–34)
Alkaline Phosphatase: 62 U/L (ref 40–150)
Anion Gap: 11 mEq/L (ref 3–11)
BUN: 33 mg/dL — AB (ref 7.0–26.0)
CALCIUM: 9.4 mg/dL (ref 8.4–10.4)
CHLORIDE: 97 meq/L — AB (ref 98–109)
CO2: 23 mEq/L (ref 22–29)
Creatinine: 1.3 mg/dL (ref 0.7–1.3)
Glucose: 137 mg/dl (ref 70–140)
Potassium: 5.1 mEq/L (ref 3.5–5.1)
Sodium: 130 mEq/L — ABNORMAL LOW (ref 136–145)
Total Bilirubin: 0.32 mg/dL (ref 0.20–1.20)
Total Protein: 6.9 g/dL (ref 6.4–8.3)

## 2013-06-19 MED ORDER — DEXAMETHASONE SODIUM PHOSPHATE 20 MG/5ML IJ SOLN
20.0000 mg | Freq: Once | INTRAMUSCULAR | Status: AC
  Administered 2013-06-19: 20 mg via INTRAVENOUS

## 2013-06-19 MED ORDER — DEXAMETHASONE SODIUM PHOSPHATE 20 MG/5ML IJ SOLN
INTRAMUSCULAR | Status: AC
  Filled 2013-06-19: qty 5

## 2013-06-19 MED ORDER — SODIUM CHLORIDE 0.9 % IV SOLN
288.0000 mg | Freq: Once | INTRAVENOUS | Status: AC
  Administered 2013-06-19: 290 mg via INTRAVENOUS
  Filled 2013-06-19: qty 29

## 2013-06-19 MED ORDER — ONDANSETRON 16 MG/50ML IVPB (CHCC)
16.0000 mg | Freq: Once | INTRAVENOUS | Status: AC
  Administered 2013-06-19: 16 mg via INTRAVENOUS

## 2013-06-19 MED ORDER — ONDANSETRON 16 MG/50ML IVPB (CHCC)
INTRAVENOUS | Status: AC
  Filled 2013-06-19: qty 16

## 2013-06-19 MED ORDER — SODIUM CHLORIDE 0.9 % IV SOLN
400.0000 mg/m2 | Freq: Once | INTRAVENOUS | Status: AC
  Administered 2013-06-19: 800 mg via INTRAVENOUS
  Filled 2013-06-19: qty 32

## 2013-06-19 MED ORDER — CYANOCOBALAMIN 1000 MCG/ML IJ SOLN
INTRAMUSCULAR | Status: AC
  Filled 2013-06-19: qty 1

## 2013-06-19 MED ORDER — CYANOCOBALAMIN 1000 MCG/ML IJ SOLN
1000.0000 ug | Freq: Once | INTRAMUSCULAR | Status: AC
  Administered 2013-06-19: 1000 ug via INTRAMUSCULAR

## 2013-06-19 MED ORDER — SODIUM CHLORIDE 0.9 % IV SOLN
Freq: Once | INTRAVENOUS | Status: AC
  Administered 2013-06-19: 11:00:00 via INTRAVENOUS

## 2013-06-19 NOTE — Patient Instructions (Signed)
[facility] Discharge Instructions for Patients Receiving Chemotherapy  Today you received the following chemotherapy agents Alimta and Carbo.  To help prevent nausea and vomiting after your treatment, we encourage you to take your nausea medication.   If you develop nausea and vomiting that is not controlled by your nausea medication, call the clinic.   BELOW ARE SYMPTOMS THAT SHOULD BE REPORTED IMMEDIATELY:  *FEVER GREATER THAN 100.5 F  *CHILLS WITH OR WITHOUT FEVER  NAUSEA AND VOMITING THAT IS NOT CONTROLLED WITH YOUR NAUSEA MEDICATION  *UNUSUAL SHORTNESS OF BREATH  *UNUSUAL BRUISING OR BLEEDING  TENDERNESS IN MOUTH AND THROAT WITH OR WITHOUT PRESENCE OF ULCERS  *URINARY PROBLEMS  *BOWEL PROBLEMS  UNUSUAL RASH Items with * indicate a potential emergency and should be followed up as soon as possible.  Feel free to call the clinic you have any questions or concerns. The clinic phone number is [phone].

## 2013-06-19 NOTE — Progress Notes (Signed)
[facility] [phone]   [phone]  OFFICE PROGRESS NOTE  [name], MD [address] [place] [address]  DIAGNOSIS AND STAGE: Metastatic non-small cell lung cancer, adenocarcinoma presented with left upper lobe lung mass in addition to extensive supraclavicular, mediastinal adenopathy and bilateral pulmonary nodules diagnosed in July of 2014. EGFR mutation as well as ALK gene translocation are negative.   PRIOR THERAPY: Palliative radiotherapy to the enlarged mediastinal and supraclavicular lymph nodes under the care of Dr. Tammi Klippel.   CURRENT THERAPY:  Systemic chemotherapy with carboplatin for AUC of 5 and Alimta 500 mg/M2 giving every 3 weeks. First dose given on 01/21/2013. Status post 5 cycles. The patient will resume cycle #4 on 05/07/2013 with carboplatin for AUC of 4 and Alimta 400 mg/M2 every 3 weeks.  CHEMOTHERAPY INTENT: Palliative  CURRENT # OF CHEMOTHERAPY CYCLES: 6 CURRENT ANTIEMETICS: Zofran, dexamethasone and Compazine  CURRENT SMOKING STATUS: currently a nonsmoker  ORAL CHEMOTHERAPY AND CONSENT: None  CURRENT BISPHOSPHONATES USE: None  PAIN MANAGEMENT: No pain  NARCOTICS INDUCED CONSTIPATION: N/A  LIVING WILL AND CODE STATUS: Full code   INTERVAL HISTORY: Tyler [name] 80 y.o. male returns to the clinic today for followup visit accompanied by his girlfriend, Tyler [name], his daughter and son. The patient is feeling fine today with no specific complaints except for the persistent fatigue as well as hoarseness of his voice which is improved compared to several weeks ago. He has more energy and was able to do a lot of things at home. He is repeating his 80th birthday today. He tolerated the last cycle of his chemotherapy with reduced dose carboplatin and Alimta fairly well. The patient denied having any significant chest pain but continues to have shortness of breath with exertion. He has no fever or chills. He  has no nausea or vomiting.   MEDICAL HISTORY: Past Medical History  Diagnosis Date  . BENIGN PROSTATIC HYPERTROPHY 07/23/2008  . 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 HIP PAIN, LEFT 07/04/2007  . HYPERLIPIDEMIA 01/02/2007  . HYPERTENSION 01/02/2007  . INSOMNIA 11/16/2008  . PROSTATE SPECIFIC ANTIGEN, ELEVATED 09/10/2008  . Lung cancer     metastatic non small cell lung cancer, adenocarcinoma   . AAA (abdominal aortic aneurysm)   . Dysrhythmia     afib  . Shortness of breath   . COPD (chronic obstructive pulmonary disease)   . GERD (gastroesophageal reflux disease)   . Anemia   . History of blood transfusion     ALLERGIES:  is allergic to vicodin.  MEDICATIONS:  Current Outpatient Prescriptions  Medication Sig Dispense Refill  . cholecalciferol (VITAMIN D) 1000 UNITS tablet Take 1,000 Units by mouth every morning.       Marland Kitchen dexamethasone (DECADRON) 4 MG tablet Take 4 mg by mouth 2 (two) times daily with a meal. Take while on Chemo      . folic acid (FOLVITE) 1 MG tablet Take 1 mg by mouth every morning.      . multivitamin-iron-minerals-folic acid (CENTRUM) chewable tablet Chew 1 tablet by mouth every morning.       Marland Kitchen omeprazole (PRILOSEC) 20 MG capsule       . propranolol (INDERAL) 10 MG tablet Take 1 tablet by mouth 2 (two) times daily.      . Alum & Mag Hydroxide-Simeth (MAGIC MOUTHWASH W/LIDOCAINE) SOLN Take 5 mLs by mouth 4 (four) times daily as needed for mouth pain. 64m QID PRN swish and  spit.  240 mL  0  . COMBIVENT RESPIMAT 20-100 MCG/ACT AERS respimat       . HYDROcodone-homatropine (HYCODAN) 5-1.5 MG/5ML syrup Take 5 mLs by mouth every 6 (six) hours as needed for cough.  240 mL  0  . oxyCODONE-acetaminophen (ROXICET) 5-325 MG per tablet Take 1 tablet by mouth every 6 (six) hours as needed for severe pain.  30 tablet  0  . polyethylene glycol powder (GLYCOLAX/MIRALAX) powder Take 17 g by mouth daily as needed (for constipation).       No current facility-administered  medications for this visit.    SURGICAL HISTORY:  Past Surgical History  Procedure Laterality Date  . Transurethral resection of prostate  2004  . Total hip arthroplasty  2010, 2011    2010- left; 2011- right  . Upper gastrointestinal endoscopy    . Abdominal aortic endovascular stent graft N/A 04/14/2013    Procedure: ABDOMINAL AORTIC ENDOVASCULAR STENT GRAFT;  Surgeon: Conrad Bairoil, MD;  Location: [facility] OR;  Service: Vascular;  Laterality: N/A;    REVIEW OF SYSTEMS:  Constitutional: positive for anorexia and fatigue Eyes: negative Ears, nose, mouth, throat, and face: positive for hoarseness.  Respiratory: positive for cough and dyspnea on exertion Cardiovascular: negative Gastrointestinal: negative Genitourinary:negative Integument/breast: negative Hematologic/lymphatic: negative Musculoskeletal: negative. Neurological: negative Behavioral/Psych: positive for anxiety, depression and fatigue Endocrine: negative Allergic/Immunologic: negative   PHYSICAL EXAMINATION: General appearance: alert, cooperative, fatigued and no distress Head: Normocephalic, without obvious abnormality, atraumatic Neck: no adenopathy, no JVD, supple, symmetrical, trachea midline and thyroid not enlarged, symmetric, no tenderness/mass/nodules Lymph nodes: Cervical, supraclavicular, and axillary nodes normal. Resp: clear to auscultation bilaterally Back: symmetric, no curvature. ROM normal. No CVA tenderness. Cardio: regular rate and rhythm, S1, S2 normal, no murmur, click, rub or gallop GI: soft, non-tender; bowel sounds normal; no masses,  no organomegaly Extremities: extremities normal, atraumatic, no cyanosis or edema Neurologic: Alert and oriented X 3, normal strength and tone. Normal symmetric reflexes. Normal coordination and gait  ECOG PERFORMANCE STATUS: 1 - Symptomatic but completely ambulatory  Blood pressure 140/82, pulse 79, temperature 96.5 F (35.8 C), temperature source Oral, resp. rate  18, height _0  (1.727 m), weight 168 lb 14.4 oz (76.613 kg), SpO2 96.00%.  LABORATORY DATA: Lab Results  Component Value Date   WBC 6.3 06/19/2013   HGB 9.4* 06/19/2013   HCT 28.6* 06/19/2013   MCV 92.6 06/19/2013   PLT 197 06/19/2013      Chemistry      Component Value Date/Time   NA 135* 06/10/2013 1107   NA 131* 04/15/2013 0500   K 4.4 06/10/2013 1107   K 4.1 04/15/2013 0500   CL 96 04/15/2013 0500   CO2 26 06/10/2013 1107   CO2 22 04/15/2013 0500   BUN 25.2 06/10/2013 1107   BUN 17 04/15/2013 0500   CREATININE 1.4* 06/10/2013 1107   CREATININE 1.27 04/15/2013 0500      Component Value Date/Time   CALCIUM 9.3 06/10/2013 1107   CALCIUM 8.4 04/15/2013 0500   ALKPHOS 66 06/10/2013 1107   ALKPHOS 42 04/15/2013 0500   AST 23 06/10/2013 1107   AST 15 04/15/2013 0500   ALT 31 06/10/2013 1107   ALT 9 04/15/2013 0500   BILITOT 0.48 06/10/2013 1107   BILITOT 0.5 04/15/2013 0500       RADIOGRAPHIC STUDIES:  ASSESSMENT AND PLAN: This is a very pleasant 80 years old white male with metastatic non-small cell lung cancer, adenocarcinoma with negative EGFR mutation and  negative ALK gene translocation currently on systemic chemotherapy with carboplatin and Alimta status post 4 cycles. He is currently on reduced dose carboplatin for AUC of 4 and Alimta at 400 mg/M2 starting from cycle 4 and tolerating it fairly well. The patient is doing very well today and is starting being very active over the last few weeks. I recommended for the patient to proceed with cycle #6 today as scheduled. He would come back for followup visit in 3 weeks with the next cycle of his treatment after repeating CT scan of the neck, chest, abdomen and pelvis for restaging of his disease. The patient and his family had several questions today regarding the future stable his treatment and his prognosis and I answered them completely to their satisfactions. The patient was advised to call immediately if he has any  concerning symptoms in the interval.  I spent 25 minutes counseling the patient face to face. The total time spent in the appointment was 35 minutes.

## 2013-06-19 NOTE — Telephone Encounter (Signed)
verified w Dr Tyler [name] labs weekly until pt sees him again on 1/22 Appts made per 01/02 POF AVS and CAL printed shh

## 2013-06-21 NOTE — Patient Instructions (Signed)
CURRENT THERAPY:  Systemic chemotherapy with carboplatin for AUC of 5 and Alimta 500 mg/M2 giving every 3 weeks. First dose given on 01/21/2013. Status post 5 cycles. The patient will resume cycle #4 on 05/07/2013 with carboplatin for AUC of 4 and Alimta 400 mg/M2 every 3 weeks.  CHEMOTHERAPY INTENT: Palliative  CURRENT # OF CHEMOTHERAPY CYCLES: 6  CURRENT ANTIEMETICS: Zofran, dexamethasone and Compazine  CURRENT SMOKING STATUS: currently a nonsmoker  ORAL CHEMOTHERAPY AND CONSENT: None  CURRENT BISPHOSPHONATES USE: None  PAIN MANAGEMENT: No pain  NARCOTICS INDUCED CONSTIPATION: N/A  LIVING WILL AND CODE STATUS: Full code

## 2013-06-25 ENCOUNTER — Other Ambulatory Visit: Payer: Medicare Other

## 2013-06-26 ENCOUNTER — Other Ambulatory Visit (HOSPITAL_BASED_OUTPATIENT_CLINIC_OR_DEPARTMENT_OTHER): Payer: Medicare Other

## 2013-06-26 DIAGNOSIS — C3490 Malignant neoplasm of unspecified part of unspecified bronchus or lung: Secondary | ICD-10-CM

## 2013-06-26 DIAGNOSIS — C3410 Malignant neoplasm of upper lobe, unspecified bronchus or lung: Secondary | ICD-10-CM

## 2013-06-26 LAB — CBC WITH DIFFERENTIAL/PLATELET
BASO%: 0.4 % (ref 0.0–2.0)
Basophils Absolute: 0 10*3/uL (ref 0.0–0.1)
EOS ABS: 0 10*3/uL (ref 0.0–0.5)
EOS%: 1.7 % (ref 0.0–7.0)
HCT: 29.9 % — ABNORMAL LOW (ref 38.4–49.9)
HGB: 10 g/dL — ABNORMAL LOW (ref 13.0–17.1)
LYMPH%: 20.1 % (ref 14.0–49.0)
MCH: 31.3 pg (ref 27.2–33.4)
MCHC: 33.4 g/dL (ref 32.0–36.0)
MCV: 93.7 fL (ref 79.3–98.0)
MONO#: 0.2 10*3/uL (ref 0.1–0.9)
MONO%: 7.4 % (ref 0.0–14.0)
NEUT#: 1.6 10*3/uL (ref 1.5–6.5)
NEUT%: 70.4 % (ref 39.0–75.0)
Platelets: 131 10*3/uL — ABNORMAL LOW (ref 140–400)
RBC: 3.19 10*6/uL — AB (ref 4.20–5.82)
RDW: 22.5 % — ABNORMAL HIGH (ref 11.0–14.6)
WBC: 2.3 10*3/uL — ABNORMAL LOW (ref 4.0–10.3)
lymph#: 0.5 10*3/uL — ABNORMAL LOW (ref 0.9–3.3)

## 2013-06-26 LAB — COMPREHENSIVE METABOLIC PANEL (CC13)
ALBUMIN: 3.6 g/dL (ref 3.5–5.0)
ALK PHOS: 58 U/L (ref 40–150)
ALT: 31 U/L (ref 0–55)
AST: 25 U/L (ref 5–34)
Anion Gap: 7 mEq/L (ref 3–11)
BILIRUBIN TOTAL: 0.56 mg/dL (ref 0.20–1.20)
BUN: 32.9 mg/dL — ABNORMAL HIGH (ref 7.0–26.0)
CO2: 29 mEq/L (ref 22–29)
Calcium: 9.2 mg/dL (ref 8.4–10.4)
Chloride: 99 mEq/L (ref 98–109)
Creatinine: 1.3 mg/dL (ref 0.7–1.3)
GLUCOSE: 104 mg/dL (ref 70–140)
POTASSIUM: 4.6 meq/L (ref 3.5–5.1)
SODIUM: 135 meq/L — AB (ref 136–145)
Total Protein: 6.2 g/dL — ABNORMAL LOW (ref 6.4–8.3)

## 2013-07-01 ENCOUNTER — Other Ambulatory Visit: Payer: Self-pay | Admitting: *Deleted

## 2013-07-01 DIAGNOSIS — C3490 Malignant neoplasm of unspecified part of unspecified bronchus or lung: Secondary | ICD-10-CM

## 2013-07-02 ENCOUNTER — Other Ambulatory Visit (HOSPITAL_BASED_OUTPATIENT_CLINIC_OR_DEPARTMENT_OTHER): Payer: Medicare Other

## 2013-07-02 DIAGNOSIS — C3490 Malignant neoplasm of unspecified part of unspecified bronchus or lung: Secondary | ICD-10-CM

## 2013-07-02 DIAGNOSIS — C3410 Malignant neoplasm of upper lobe, unspecified bronchus or lung: Secondary | ICD-10-CM

## 2013-07-02 LAB — CBC WITH DIFFERENTIAL/PLATELET
BASO%: 0.3 % (ref 0.0–2.0)
BASOS ABS: 0 10*3/uL (ref 0.0–0.1)
EOS%: 0.6 % (ref 0.0–7.0)
Eosinophils Absolute: 0 10*3/uL (ref 0.0–0.5)
HCT: 27.4 % — ABNORMAL LOW (ref 38.4–49.9)
HEMOGLOBIN: 9 g/dL — AB (ref 13.0–17.1)
LYMPH%: 17.6 % (ref 14.0–49.0)
MCH: 30.9 pg (ref 27.2–33.4)
MCHC: 32.8 g/dL (ref 32.0–36.0)
MCV: 94.2 fL (ref 79.3–98.0)
MONO#: 0.5 10*3/uL (ref 0.1–0.9)
MONO%: 17.3 % — AB (ref 0.0–14.0)
NEUT#: 2 10*3/uL (ref 1.5–6.5)
NEUT%: 64.2 % (ref 39.0–75.0)
PLATELETS: 54 10*3/uL — AB (ref 140–400)
RBC: 2.91 10*6/uL — AB (ref 4.20–5.82)
RDW: 22.9 % — ABNORMAL HIGH (ref 11.0–14.6)
WBC: 3.1 10*3/uL — ABNORMAL LOW (ref 4.0–10.3)
lymph#: 0.6 10*3/uL — ABNORMAL LOW (ref 0.9–3.3)

## 2013-07-02 LAB — COMPREHENSIVE METABOLIC PANEL (CC13)
ALBUMIN: 3.5 g/dL (ref 3.5–5.0)
ALK PHOS: 66 U/L (ref 40–150)
ALT: 33 U/L (ref 0–55)
AST: 25 U/L (ref 5–34)
Anion Gap: 10 mEq/L (ref 3–11)
BILIRUBIN TOTAL: 0.39 mg/dL (ref 0.20–1.20)
BUN: 27.6 mg/dL — AB (ref 7.0–26.0)
CO2: 27 mEq/L (ref 22–29)
Calcium: 9.2 mg/dL (ref 8.4–10.4)
Chloride: 100 mEq/L (ref 98–109)
Creatinine: 1.3 mg/dL (ref 0.7–1.3)
GLUCOSE: 129 mg/dL (ref 70–140)
Potassium: 4.3 mEq/L (ref 3.5–5.1)
Sodium: 137 mEq/L (ref 136–145)
Total Protein: 6.2 g/dL — ABNORMAL LOW (ref 6.4–8.3)

## 2013-07-06 ENCOUNTER — Other Ambulatory Visit: Payer: Self-pay | Admitting: Medical Oncology

## 2013-07-06 DIAGNOSIS — C3490 Malignant neoplasm of unspecified part of unspecified bronchus or lung: Secondary | ICD-10-CM

## 2013-07-06 MED ORDER — FOLIC ACID 1 MG PO TABS: 1.0000 mg | ORAL_TABLET | Freq: Every morning | ORAL | Status: DC

## 2013-07-06 NOTE — Telephone Encounter (Signed)
Called in refill for folic acid and pt notified via voice message.

## 2013-07-08 ENCOUNTER — Encounter (HOSPITAL_COMMUNITY): Payer: Self-pay

## 2013-07-08 ENCOUNTER — Ambulatory Visit (HOSPITAL_COMMUNITY): Payer: Medicare Other

## 2013-07-08 ENCOUNTER — Ambulatory Visit (HOSPITAL_COMMUNITY)
Admission: RE | Admit: 2013-07-08 | Discharge: 2013-07-08 | Disposition: A | Discharge status: Home / Self Care | Payer: Medicare Other | Source: Ambulatory Visit | Attending: Internal Medicine | Admitting: Internal Medicine

## 2013-07-08 DIAGNOSIS — C3490 Malignant neoplasm of unspecified part of unspecified bronchus or lung: Secondary | ICD-10-CM | POA: Insufficient documentation

## 2013-07-08 DIAGNOSIS — M5030 Other cervical disc degeneration, unspecified cervical region: Secondary | ICD-10-CM | POA: Insufficient documentation

## 2013-07-08 DIAGNOSIS — R599 Enlarged lymph nodes, unspecified: Secondary | ICD-10-CM | POA: Insufficient documentation

## 2013-07-08 DIAGNOSIS — J701 Chronic and other pulmonary manifestations due to radiation: Secondary | ICD-10-CM | POA: Insufficient documentation

## 2013-07-08 DIAGNOSIS — N281 Cyst of kidney, acquired: Secondary | ICD-10-CM | POA: Insufficient documentation

## 2013-07-08 DIAGNOSIS — M51379 Other intervertebral disc degeneration, lumbosacral region without mention of lumbar back pain or lower extremity pain: Secondary | ICD-10-CM | POA: Insufficient documentation

## 2013-07-08 DIAGNOSIS — K4090 Unilateral inguinal hernia, without obstruction or gangrene, not specified as recurrent: Secondary | ICD-10-CM | POA: Insufficient documentation

## 2013-07-08 DIAGNOSIS — J3801 Paralysis of vocal cords and larynx, unilateral: Secondary | ICD-10-CM | POA: Insufficient documentation

## 2013-07-08 DIAGNOSIS — Z96649 Presence of unspecified artificial hip joint: Secondary | ICD-10-CM | POA: Insufficient documentation

## 2013-07-08 DIAGNOSIS — I2510 Atherosclerotic heart disease of native coronary artery without angina pectoris: Secondary | ICD-10-CM | POA: Insufficient documentation

## 2013-07-08 DIAGNOSIS — I6529 Occlusion and stenosis of unspecified carotid artery: Secondary | ICD-10-CM | POA: Insufficient documentation

## 2013-07-08 DIAGNOSIS — C3492 Malignant neoplasm of unspecified part of left bronchus or lung: Secondary | ICD-10-CM

## 2013-07-08 DIAGNOSIS — G9389 Other specified disorders of brain: Secondary | ICD-10-CM | POA: Insufficient documentation

## 2013-07-08 DIAGNOSIS — I714 Abdominal aortic aneurysm, without rupture, unspecified: Secondary | ICD-10-CM | POA: Insufficient documentation

## 2013-07-08 DIAGNOSIS — Y842 Radiological procedure and radiotherapy as the cause of abnormal reaction of the patient, or of later complication, without mention of misadventure at the time of the procedure: Secondary | ICD-10-CM | POA: Insufficient documentation

## 2013-07-08 DIAGNOSIS — J438 Other emphysema: Secondary | ICD-10-CM | POA: Insufficient documentation

## 2013-07-08 DIAGNOSIS — M5137 Other intervertebral disc degeneration, lumbosacral region: Secondary | ICD-10-CM | POA: Insufficient documentation

## 2013-07-08 MED ORDER — IOHEXOL 300 MG/ML  SOLN
100.0000 mL | Freq: Once | INTRAMUSCULAR | Status: AC | PRN
  Administered 2013-07-08: 100 mL via INTRAVENOUS

## 2013-07-09 ENCOUNTER — Ambulatory Visit (HOSPITAL_BASED_OUTPATIENT_CLINIC_OR_DEPARTMENT_OTHER): Payer: Medicare Other | Admitting: Internal Medicine

## 2013-07-09 ENCOUNTER — Other Ambulatory Visit (HOSPITAL_BASED_OUTPATIENT_CLINIC_OR_DEPARTMENT_OTHER): Payer: Medicare Other

## 2013-07-09 ENCOUNTER — Encounter: Payer: Self-pay | Admitting: Internal Medicine

## 2013-07-09 VITALS — BP 127/88 | HR 91 | Temp 97.5°F | Resp 18 | Ht 68.0 in | Wt 165.4 lb

## 2013-07-09 DIAGNOSIS — C3410 Malignant neoplasm of upper lobe, unspecified bronchus or lung: Secondary | ICD-10-CM

## 2013-07-09 DIAGNOSIS — C3490 Malignant neoplasm of unspecified part of unspecified bronchus or lung: Secondary | ICD-10-CM

## 2013-07-09 DIAGNOSIS — R911 Solitary pulmonary nodule: Secondary | ICD-10-CM

## 2013-07-09 LAB — CBC WITH DIFFERENTIAL/PLATELET
BASO%: 0.3 % (ref 0.0–2.0)
Basophils Absolute: 0 10*3/uL (ref 0.0–0.1)
EOS%: 1.8 % (ref 0.0–7.0)
Eosinophils Absolute: 0.1 10*3/uL (ref 0.0–0.5)
HCT: 29.6 % — ABNORMAL LOW (ref 38.4–49.9)
HEMOGLOBIN: 9.8 g/dL — AB (ref 13.0–17.1)
LYMPH#: 0.5 10*3/uL — AB (ref 0.9–3.3)
LYMPH%: 13.8 % — ABNORMAL LOW (ref 14.0–49.0)
MCH: 31.7 pg (ref 27.2–33.4)
MCHC: 33.1 g/dL (ref 32.0–36.0)
MCV: 95.8 fL (ref 79.3–98.0)
MONO#: 0.6 10*3/uL (ref 0.1–0.9)
MONO%: 19.3 % — ABNORMAL HIGH (ref 0.0–14.0)
NEUT#: 2.1 10*3/uL (ref 1.5–6.5)
NEUT%: 64.8 % (ref 39.0–75.0)
Platelets: 131 10*3/uL — ABNORMAL LOW (ref 140–400)
RBC: 3.09 10*6/uL — ABNORMAL LOW (ref 4.20–5.82)
RDW: 24.4 % — ABNORMAL HIGH (ref 11.0–14.6)
WBC: 3.3 10*3/uL — AB (ref 4.0–10.3)

## 2013-07-09 LAB — COMPREHENSIVE METABOLIC PANEL (CC13)
ALBUMIN: 3.7 g/dL (ref 3.5–5.0)
ALT: 22 U/L (ref 0–55)
ANION GAP: 9 meq/L (ref 3–11)
AST: 21 U/L (ref 5–34)
Alkaline Phosphatase: 52 U/L (ref 40–150)
BUN: 20.6 mg/dL (ref 7.0–26.0)
CALCIUM: 9.4 mg/dL (ref 8.4–10.4)
CHLORIDE: 98 meq/L (ref 98–109)
CO2: 29 mEq/L (ref 22–29)
Creatinine: 1.6 mg/dL — ABNORMAL HIGH (ref 0.7–1.3)
Glucose: 156 mg/dl — ABNORMAL HIGH (ref 70–140)
POTASSIUM: 4.5 meq/L (ref 3.5–5.1)
Sodium: 136 mEq/L (ref 136–145)
TOTAL PROTEIN: 6.4 g/dL (ref 6.4–8.3)
Total Bilirubin: 0.4 mg/dL (ref 0.20–1.20)

## 2013-07-09 NOTE — Progress Notes (Signed)
[facility] [phone]) [phone]   [phone]  OFFICE PROGRESS NOTE  [name], MD [address] [place] [address]  DIAGNOSIS AND STAGE: Metastatic non-small cell lung cancer, adenocarcinoma presented with left upper lobe lung mass in addition to extensive supraclavicular, mediastinal adenopathy and bilateral pulmonary nodules diagnosed in July of 2014. EGFR mutation as well as ALK gene translocation are negative.   PRIOR THERAPY: Palliative radiotherapy to the enlarged mediastinal and supraclavicular lymph nodes under the care of Dr. Tammi Klippel.   CURRENT THERAPY:  Systemic chemotherapy with carboplatin for AUC of 5 and Alimta 500 mg/M2 giving every 3 weeks. First dose given on 01/21/2013. Status post 5 cycles. The patient will resume cycle #4 on 05/07/2013 with carboplatin for AUC of 4 and Alimta 400 mg/M2 every 3 weeks.  CHEMOTHERAPY INTENT: Palliative  CURRENT # OF CHEMOTHERAPY CYCLES: 6 CURRENT ANTIEMETICS: Zofran, dexamethasone and Compazine  CURRENT SMOKING STATUS: currently a nonsmoker  ORAL CHEMOTHERAPY AND CONSENT: None  CURRENT BISPHOSPHONATES USE: None  PAIN MANAGEMENT: No pain  NARCOTICS INDUCED CONSTIPATION: N/A  LIVING WILL AND CODE STATUS: Full code   INTERVAL HISTORY: Tyler [name] 80 y.o. male returns to the clinic today for followup visit accompanied by his girlfriend, [name], his daughter. The patient is feeling fine today with no specific complaints except for the persistent fatigue. The hoarseness of his voice has improved. He has more energy and was able to do a lot of things at home. He celebrating his 80th birthday 3 weeks ago with gathering of several family members. He tolerated the last cycle of his chemotherapy with reduced dose carboplatin and Alimta fairly well. The patient denied having any significant chest pain but continues to have shortness of breath with exertion. He has no fever or chills. He  has no nausea or vomiting. He had repeat CT scan of the neck, chest, abdomen and pelvis performed recently and he is here for evaluation and discussion of his scan results.  MEDICAL HISTORY: Past Medical History  Diagnosis Date  . BENIGN PROSTATIC HYPERTROPHY 07/23/2008  . 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 HIP PAIN, LEFT 07/04/2007  . HYPERLIPIDEMIA 01/02/2007  . HYPERTENSION 01/02/2007  . INSOMNIA 11/16/2008  . PROSTATE SPECIFIC ANTIGEN, ELEVATED 09/10/2008  . AAA (abdominal aortic aneurysm)   . Dysrhythmia     afib  . Shortness of breath   . COPD (chronic obstructive pulmonary disease)   . GERD (gastroesophageal reflux disease)   . Anemia   . History of blood transfusion   . Lung cancer dx'd 11/2012    metastatic non small cell lung cancer, adenocarcinoma     ALLERGIES:  is allergic to vicodin.  MEDICATIONS:  Current Outpatient Prescriptions  Medication Sig Dispense Refill  . acetaminophen (TYLENOL) 325 MG tablet Take 650 mg by mouth every 6 (six) hours as needed.      . cholecalciferol (VITAMIN D) 1000 UNITS tablet Take 1,000 Units by mouth every morning.       . folic acid (FOLVITE) 1 MG tablet Take 1 tablet (1 mg total) by mouth every morning.  30 tablet  1  . multivitamin-iron-minerals-folic acid (CENTRUM) chewable tablet Chew 1 tablet by mouth every morning.       . polyethylene glycol powder (GLYCOLAX/MIRALAX) powder Take 17 g by mouth daily as needed (for constipation).      . propranolol (INDERAL) 10 MG tablet Take 1 tablet by mouth 2 (two) times daily.      Marland Kitchen  COMBIVENT RESPIMAT 20-100 MCG/ACT AERS respimat       . dexamethasone (DECADRON) 4 MG tablet Take 4 mg by mouth 2 (two) times daily with a meal. Take while on Chemo      . HYDROcodone-homatropine (HYCODAN) 5-1.5 MG/5ML syrup Take 5 mLs by mouth every 6 (six) hours as needed for cough.  240 mL  0  . omeprazole (PRILOSEC) 20 MG capsule       . oxyCODONE-acetaminophen (ROXICET) 5-325 MG per tablet Take 1 tablet by  mouth every 6 (six) hours as needed for severe pain.  30 tablet  0   No current facility-administered medications for this visit.    SURGICAL HISTORY:  Past Surgical History  Procedure Laterality Date  . Transurethral resection of prostate  2004  . Total hip arthroplasty  2010, 2011    2010- left; 2011- right  . Upper gastrointestinal endoscopy    . Abdominal aortic endovascular stent graft N/A 04/14/2013    Procedure: ABDOMINAL AORTIC ENDOVASCULAR STENT GRAFT;  Surgeon: Conrad Dublin, MD;  Location: [facility] OR;  Service: Vascular;  Laterality: N/A;    REVIEW OF SYSTEMS:  Constitutional: positive for anorexia and fatigue Eyes: negative Ears, nose, mouth, throat, and face: positive for hoarseness.  Respiratory: positive for cough and dyspnea on exertion Cardiovascular: negative Gastrointestinal: negative Genitourinary:negative Integument/breast: negative Hematologic/lymphatic: negative Musculoskeletal: negative. Neurological: negative Behavioral/Psych: positive for anxiety, depression and fatigue Endocrine: negative Allergic/Immunologic: negative   PHYSICAL EXAMINATION: General appearance: alert, cooperative, fatigued and no distress Head: Normocephalic, without obvious abnormality, atraumatic Neck: no adenopathy, no JVD, supple, symmetrical, trachea midline and thyroid not enlarged, symmetric, no tenderness/mass/nodules Lymph nodes: Cervical, supraclavicular, and axillary nodes normal. Resp: clear to auscultation bilaterally Back: symmetric, no curvature. ROM normal. No CVA tenderness. Cardio: regular rate and rhythm, S1, S2 normal, no murmur, click, rub or gallop GI: soft, non-tender; bowel sounds normal; no masses,  no organomegaly Extremities: extremities normal, atraumatic, no cyanosis or edema Neurologic: Alert and oriented X 3, normal strength and tone. Normal symmetric reflexes. Normal coordination and gait  ECOG PERFORMANCE STATUS: 1 - Symptomatic but completely  ambulatory  Blood pressure 127/88, pulse 91, temperature 97.5 F (36.4 C), temperature source Oral, resp. rate 18, height _0  (1.727 m), weight 165 lb 6.4 oz (75.025 kg), SpO2 100.00%.  LABORATORY DATA: Lab Results  Component Value Date   WBC 3.3* 07/09/2013   HGB 9.8* 07/09/2013   HCT 29.6* 07/09/2013   MCV 95.8 07/09/2013   PLT 131* 07/09/2013      Chemistry      Component Value Date/Time   NA 136 07/09/2013 1012   NA 131* 04/15/2013 0500   K 4.5 07/09/2013 1012   K 4.1 04/15/2013 0500   CL 96 04/15/2013 0500   CO2 29 07/09/2013 1012   CO2 22 04/15/2013 0500   BUN 20.6 07/09/2013 1012   BUN 17 04/15/2013 0500   CREATININE 1.6* 07/09/2013 1012   CREATININE 1.27 04/15/2013 0500      Component Value Date/Time   CALCIUM 9.4 07/09/2013 1012   CALCIUM 8.4 04/15/2013 0500   ALKPHOS 52 07/09/2013 1012   ALKPHOS 42 04/15/2013 0500   AST 21 07/09/2013 1012   AST 15 04/15/2013 0500   ALT 22 07/09/2013 1012   ALT 9 04/15/2013 0500   BILITOT 0.40 07/09/2013 1012   BILITOT 0.5 04/15/2013 0500       RADIOGRAPHIC STUDIES: Ct Soft Tissue Neck W Contrast  07/08/2013   CLINICAL DATA:  Non-small-cell lung cancer  staging.  EXAM: CT NECK WITH CONTRAST  TECHNIQUE: Multidetector CT imaging of the neck was performed using the standard protocol following the bolus administration of intravenous contrast.  CONTRAST:  150m OMNIPAQUE IOHEXOL 300 MG/ML  SOLN  COMPARISON:  03/24/2013 neck CT.  12/25/2012 head CT.  FINDINGS: Encephalomalacia is again partially visualized in the right occipital lobe. Orbits are unremarkable. Visualized paranasal sinuses and mastoid air cells are clear. Extensive carotid siphon calcification is noted.  Nasopharynx, oropharynx, and oral cavity are unremarkable. 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 Thyroid is unremarkable. The submandibular and parotid glands are unremarkable.  Previously described bilateral level IV lymph nodes have decreased in size, now measuring up to 7 mm  on the right (previously 11 mm) and 8 mm on the left (previously 10 mm). No new enlarged cervical lymph nodes are identified. Normal intravascular enhancement is present. Mild to moderate carotid bifurcation atherosclerosis is noted without evidence of significant stenosis.  Lungs are evaluated on separate dedicated chest CT. Moderate to severe multilevel degenerative disc disease and facet arthrosis are noted in the cervical spine.  IMPRESSION: Decreased size of lower cervical lymph nodes. No evidence of new lymphadenopathy in the neck.   Electronically Signed   By: ALogan Bores  On: 07/08/2013 15:59   Ct Chest W Contrast  07/08/2013   CLINICAL DATA:  Metastatic non-small-cell lung cancer. Left upper lobe lesion with mediastinal adenopathy and pulmonary nodules.  EXAM: CT CHEST, ABDOMEN, AND PELVIS WITH CONTRAST  TECHNIQUE: Multidetector CT imaging of the chest, abdomen and pelvis was performed following the standard protocol during bolus administration of intravenous contrast.  CONTRAST:  1040mOMNIPAQUE IOHEXOL 300 MG/ML  SOLN  COMPARISON:  03/24/2013  FINDINGS:  CT CHEST FINDINGS  Left supraclavicular lymph node which was 1.2 cm in short axis previously is now 0.7 cm. There is no axillary lymphadenopathy. The ill-defined right paratracheal node which was 1.8 cm in short axis on the previous study now measures 1.4 cm. The 14 mm short axis AP window lymph node measured on the previous study is now 11 mm in short axis. A 14 mm short axis left suprahilar lymph node is now 12 mm in short axis.  The heart size is normal. There is persistent pericardial fluid which appears slightly decreased in the interval. Coronary artery calcification is noted. No evidence for pleural effusion.  Lung windows demonstrate advanced emphysema. The cavitary lesion seen previously in the medial left upper lobe appears decreased in size in the interval but is difficult to discretely measure given the interval development of a linear  band of interstitial and airspace disease in the medial left lung, likely representing radiation change. The lesion measured previously at 3.9 x 3.0 cm now measures 13 mm in the same dimension in which it measured 30 mm previously. This is a coronal diameter and AP diameters not obtainable given the superimposed radiation fibrosis.  9 mm spiculated lesion in the anterior left upper lobe on image 25 has increased in size from 5 mm previously. 8 mm left upper lobe subpleural lesion on image 27 was 4 mm previously. 10 mm left lower lobe pulmonary nodule on image 34 was 7 mm previously. Other bilateral pulmonary nodules have progressed in the interval. 10 mm nodule in the posterior right lower lobe on image 40 has cavitated in the interval.  Bone windows reveal no worrisome lytic or sclerotic osseous lesions.   CT ABDOMEN AND PELVIS FINDINGS  No focal abnormality is seen in the  liver or spleen. The stomach, duodenum, pancreas, gallbladder, and adrenal glands are unremarkable. Bilateral renal cysts are evident, left greater than right.  Abdominal aortic aneurysm measures 7.2 x 7.4 cm in maximum dimensions which compares to 7.2 x 7.9 cm previously. There is been interval stent graft placement within the aneurysm.  No abdominal lymphadenopathy.  No free fluid in the abdomen.  Imaging through the pelvis has fine detail in the lower pelvis obscured by the bilateral hip replacements. A left inguinal hernia contains a loop of sigmoid colon without evidence for complicating features. Right inguinal hernia contains only fat. There is evidence of diverticular change in the left colon without diverticulitis. The terminal ileum is normal. Insert nonvisualization appendix.  Bone windows reveal no worrisome lytic or sclerotic osseous lesions. Degenerative disc disease is seen diffusely in the lumbar spine.   IMPRESSION: Interval decrease in size of the dominant medial left upper lobe lesion with evidence for evolving radiation  fibrosis in the medial left upper and lower lobes, somewhat obscuring was remaining of the original lesion.  Left supraclavicular and mediastinal lymphadenopathy has decreased slightly in the interval.  Interval progression of numerous bilateral pulmonary metastases.  No evidence for metastatic disease in the abdomen or pelvis.  Status post interval aortic endo graft placement for aneurysm.   Electronically Signed   By: Misty Stanley M.D.   On: 07/08/2013 14:07   ASSESSMENT AND PLAN: This is a very pleasant 80 years old white male with metastatic non-small cell lung cancer, adenocarcinoma with negative EGFR mutation and negative ALK gene translocation currently on systemic chemotherapy with carboplatin and Alimta status post 6 cycles. He completed a course of reduced dose carboplatin for AUC of 4 and Alimta at 400 mg/M2 starting from cycle 4 and tolerating it fairly well.  His recent CT scan of the chest, abdomen and pelvis showed significant improvement in his disease except for a slightly enlarging small bilateral pulmonary nodules. I discussed the scan results with the patient and his family today and showed them the images. We discussed his treatment options including observation versus maintenance chemotherapy. The patient would like to take a break of chemotherapy. I would see him back for follow up visit in 3 months with repeat CT scan of the neck, chest, abdomen and pelvis.   The patient and his family had several questions today and I answered them completely to their satisfactions. The patient was advised to call immediately if he has any concerning symptoms in the interval.  I spent 25 minutes counseling the patient face to face. The total time spent in the appointment was 35 minutes.  Disclaimer: This note was dictated with voice recognition software. Similar sounding words can inadvertently be transcribed and may not be corrected upon review.

## 2013-07-10 ENCOUNTER — Telehealth: Payer: Self-pay | Admitting: Internal Medicine

## 2013-07-10 NOTE — Patient Instructions (Signed)
Follow up visit in 3 months with repeat CT scan of the neck, chest, abdomen and pelvis

## 2013-07-10 NOTE — Telephone Encounter (Signed)
s/w pt and natalie re appts for 4/21 and 4/23. dates per pt/natalie due to pt does not want to wait a week between lb/ct and seeing MM. also lab moved from 4/20 to 4/21 per pt/natalie and natalie will call central to move scan from 4/20 to 4/21.

## 2013-08-06 ENCOUNTER — Telehealth: Payer: Self-pay | Admitting: Pulmonary Disease

## 2013-08-06 NOTE — Telephone Encounter (Signed)
Called patient to make recall appointment, patient declined at this time

## 2013-08-10 ENCOUNTER — Other Ambulatory Visit: Payer: Self-pay | Admitting: Physician Assistant

## 2013-08-24 ENCOUNTER — Telehealth: Payer: Self-pay | Admitting: *Deleted

## 2013-08-24 NOTE — Telephone Encounter (Signed)
Pt called stating that he is needing to have a melanoma excision done and wanted to see if it is okay for him to do it now.  Per Dr Vista Mink, okay for pt to proceed with procedure.  Pt verbalized understanding.  SLJ

## 2013-10-05 ENCOUNTER — Other Ambulatory Visit: Payer: Medicare Other

## 2013-10-05 ENCOUNTER — Other Ambulatory Visit (HOSPITAL_COMMUNITY): Payer: Medicare Other

## 2013-10-06 ENCOUNTER — Other Ambulatory Visit (HOSPITAL_BASED_OUTPATIENT_CLINIC_OR_DEPARTMENT_OTHER): Payer: Medicare Other

## 2013-10-06 DIAGNOSIS — C3410 Malignant neoplasm of upper lobe, unspecified bronchus or lung: Secondary | ICD-10-CM

## 2013-10-06 DIAGNOSIS — R5381 Other malaise: Secondary | ICD-10-CM

## 2013-10-06 DIAGNOSIS — C3490 Malignant neoplasm of unspecified part of unspecified bronchus or lung: Secondary | ICD-10-CM

## 2013-10-06 DIAGNOSIS — R5383 Other fatigue: Secondary | ICD-10-CM

## 2013-10-06 LAB — COMPREHENSIVE METABOLIC PANEL (CC13)
ALBUMIN: 3.8 g/dL (ref 3.5–5.0)
ALT: 15 U/L (ref 0–55)
AST: 18 U/L (ref 5–34)
Alkaline Phosphatase: 61 U/L (ref 40–150)
Anion Gap: 9 mEq/L (ref 3–11)
BUN: 26.8 mg/dL — ABNORMAL HIGH (ref 7.0–26.0)
CALCIUM: 9.9 mg/dL (ref 8.4–10.4)
CHLORIDE: 100 meq/L (ref 98–109)
CO2: 30 mEq/L — ABNORMAL HIGH (ref 22–29)
Creatinine: 1.7 mg/dL — ABNORMAL HIGH (ref 0.7–1.3)
Glucose: 82 mg/dl (ref 70–140)
POTASSIUM: 4.4 meq/L (ref 3.5–5.1)
Sodium: 139 mEq/L (ref 136–145)
Total Bilirubin: 0.39 mg/dL (ref 0.20–1.20)
Total Protein: 7 g/dL (ref 6.4–8.3)

## 2013-10-06 LAB — CBC WITH DIFFERENTIAL/PLATELET
BASO%: 0.4 % (ref 0.0–2.0)
Basophils Absolute: 0 10*3/uL (ref 0.0–0.1)
EOS%: 6 % (ref 0.0–7.0)
Eosinophils Absolute: 0.3 10*3/uL (ref 0.0–0.5)
HCT: 39.5 % (ref 38.4–49.9)
HGB: 13 g/dL (ref 13.0–17.1)
LYMPH#: 1.1 10*3/uL (ref 0.9–3.3)
LYMPH%: 19.5 % (ref 14.0–49.0)
MCH: 31 pg (ref 27.2–33.4)
MCHC: 32.9 g/dL (ref 32.0–36.0)
MCV: 94 fL (ref 79.3–98.0)
MONO#: 0.6 10*3/uL (ref 0.1–0.9)
MONO%: 10.6 % (ref 0.0–14.0)
NEUT#: 3.5 10*3/uL (ref 1.5–6.5)
NEUT%: 63.5 % (ref 39.0–75.0)
Platelets: 139 10*3/uL — ABNORMAL LOW (ref 140–400)
RBC: 4.2 10*6/uL (ref 4.20–5.82)
RDW: 13.9 % (ref 11.0–14.6)
WBC: 5.5 10*3/uL (ref 4.0–10.3)

## 2013-10-07 ENCOUNTER — Ambulatory Visit (HOSPITAL_COMMUNITY)
Admission: RE | Admit: 2013-10-07 | Discharge: 2013-10-07 | Disposition: A | Discharge status: Home / Self Care | Payer: Medicare Other | Source: Ambulatory Visit | Attending: Internal Medicine | Admitting: Internal Medicine

## 2013-10-07 ENCOUNTER — Encounter (HOSPITAL_COMMUNITY): Payer: Self-pay

## 2013-10-07 DIAGNOSIS — I319 Disease of pericardium, unspecified: Secondary | ICD-10-CM | POA: Insufficient documentation

## 2013-10-07 DIAGNOSIS — N281 Cyst of kidney, acquired: Secondary | ICD-10-CM | POA: Insufficient documentation

## 2013-10-07 DIAGNOSIS — R918 Other nonspecific abnormal finding of lung field: Secondary | ICD-10-CM | POA: Insufficient documentation

## 2013-10-07 DIAGNOSIS — Z9221 Personal history of antineoplastic chemotherapy: Secondary | ICD-10-CM | POA: Insufficient documentation

## 2013-10-07 DIAGNOSIS — C3490 Malignant neoplasm of unspecified part of unspecified bronchus or lung: Secondary | ICD-10-CM | POA: Insufficient documentation

## 2013-10-07 DIAGNOSIS — R599 Enlarged lymph nodes, unspecified: Secondary | ICD-10-CM | POA: Insufficient documentation

## 2013-10-07 DIAGNOSIS — Z923 Personal history of irradiation: Secondary | ICD-10-CM | POA: Insufficient documentation

## 2013-10-07 DIAGNOSIS — K4090 Unilateral inguinal hernia, without obstruction or gangrene, not specified as recurrent: Secondary | ICD-10-CM | POA: Insufficient documentation

## 2013-10-07 MED ORDER — IOHEXOL 300 MG/ML  SOLN
80.0000 mL | Freq: Once | INTRAMUSCULAR | Status: AC | PRN
  Administered 2013-10-07: 80 mL via INTRAVENOUS

## 2013-10-08 ENCOUNTER — Ambulatory Visit (HOSPITAL_BASED_OUTPATIENT_CLINIC_OR_DEPARTMENT_OTHER): Payer: Medicare Other | Admitting: Internal Medicine

## 2013-10-08 VITALS — BP 152/98 | HR 74 | Temp 97.3°F | Resp 20 | Ht 68.0 in | Wt 170.8 lb

## 2013-10-08 DIAGNOSIS — C3490 Malignant neoplasm of unspecified part of unspecified bronchus or lung: Secondary | ICD-10-CM

## 2013-10-08 DIAGNOSIS — C3410 Malignant neoplasm of upper lobe, unspecified bronchus or lung: Secondary | ICD-10-CM

## 2013-10-08 DIAGNOSIS — R21 Rash and other nonspecific skin eruption: Secondary | ICD-10-CM

## 2013-10-08 DIAGNOSIS — C770 Secondary and unspecified malignant neoplasm of lymph nodes of head, face and neck: Secondary | ICD-10-CM

## 2013-10-08 MED ORDER — FAMCICLOVIR 500 MG PO TABS: 500.0000 mg | ORAL_TABLET | Freq: Two times a day (BID) | ORAL | Status: DC

## 2013-10-08 NOTE — Progress Notes (Signed)
[place] [phone]   Fax:([phone]  OFFICE PROGRESS NOTE  Tyler Bellows, MD [address] [place] [address]  DIAGNOSIS AND STAGE: Metastatic non-small cell lung cancer, adenocarcinoma presented with left upper lobe lung mass in addition to extensive supraclavicular, mediastinal adenopathy and bilateral pulmonary nodules diagnosed in July of 2014. EGFR mutation as well as ALK gene translocation are negative.   PRIOR THERAPY:  1) Palliative radiotherapy to the enlarged mediastinal and supraclavicular lymph nodes under the care of Dr. Tammi Klippel.  2) Systemic chemotherapy with carboplatin for AUC of 5 and Alimta 500 mg/M2 giving every 3 weeks. First dose given on 01/21/2013. Status post 6 cycles.   CURRENT THERAPY: Observation.  CHEMOTHERAPY INTENT: Palliative  CURRENT # OF CHEMOTHERAPY CYCLES: 0 CURRENT ANTIEMETICS: Zofran, dexamethasone and Compazine  CURRENT SMOKING STATUS: currently a nonsmoker  ORAL CHEMOTHERAPY AND CONSENT: None  CURRENT BISPHOSPHONATES USE: None  PAIN MANAGEMENT: No pain  NARCOTICS INDUCED CONSTIPATION: N/A  LIVING WILL AND CODE STATUS: Full code   INTERVAL HISTORY: Tyler [name] 80 y.o. male returns to the clinic today for followup visit accompanied by his girlfriend, Tyler [name], his daughter. The patient has been doing very well over the last few months and was able to walk on daily basis as well as finishing. He enjoyed the break of chemotherapy. He denied having any significant chest pain, shortness of breath, cough or hemoptysis. He has no fever or chills. He recently developed small areas of rash on the skin of the right scapular area suspicious for herpes zoster. He did not see his primary care physician or dermatologist. He has no significant weight loss or night sweats. He has no nausea or vomiting. He had repeat CT scan of the neck, chest, abdomen and pelvis performed recently and he is here for  evaluation and discussion of his scan results.  MEDICAL HISTORY: Past Medical History  Diagnosis Date  . BENIGN PROSTATIC HYPERTROPHY 07/23/2008  . 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 HIP PAIN, LEFT 07/04/2007  . HYPERLIPIDEMIA 01/02/2007  . HYPERTENSION 01/02/2007  . INSOMNIA 11/16/2008  . PROSTATE SPECIFIC ANTIGEN, ELEVATED 09/10/2008  . AAA (abdominal aortic aneurysm)   . Dysrhythmia     afib  . Shortness of breath   . COPD (chronic obstructive pulmonary disease)   . GERD (gastroesophageal reflux disease)   . Anemia   . History of blood transfusion   . Lung cancer dx'd 11/2012    metastatic non small cell lung cancer, adenocarcinoma     ALLERGIES:  is allergic to vicodin.  MEDICATIONS:  Current Outpatient Prescriptions  Medication Sig Dispense Refill  . cholecalciferol (VITAMIN D) 1000 UNITS tablet Take 1,000 Units by mouth every morning.       . multivitamin-iron-minerals-folic acid (CENTRUM) chewable tablet Chew 1 tablet by mouth every morning.       Marland Kitchen omeprazole (PRILOSEC) 20 MG capsule       . propranolol (INDERAL) 10 MG tablet Take 1 tablet by mouth 2 (two) times daily.      Marland Kitchen acetaminophen (TYLENOL) 325 MG tablet Take 650 mg by mouth every 6 (six) hours as needed.       No current facility-administered medications for this visit.    SURGICAL HISTORY:  Past Surgical History  Procedure Laterality Date  . Transurethral resection of prostate  2004  . Total hip arthroplasty  2010, 2011    2010- left; 2011- right  . Upper gastrointestinal endoscopy    .  Abdominal aortic endovascular stent graft N/A 04/14/2013    Procedure: ABDOMINAL AORTIC ENDOVASCULAR STENT GRAFT;  Surgeon: Conrad San Ardo, MD;  Location: [facility] OR;  Service: Vascular;  Laterality: N/A;    REVIEW OF SYSTEMS:  Constitutional: positive for anorexia and fatigue Eyes: negative Ears, nose, mouth, throat, and face: positive for hoarseness.  Respiratory: positive for cough and dyspnea on  exertion Cardiovascular: negative Gastrointestinal: negative Genitourinary:negative Integument/breast: negative Hematologic/lymphatic: negative Musculoskeletal: negative. Neurological: negative Behavioral/Psych: positive for anxiety, depression and fatigue Endocrine: negative Allergic/Immunologic: negative   PHYSICAL EXAMINATION: General appearance: alert, cooperative, fatigued and no distress Head: Normocephalic, without obvious abnormality, atraumatic Neck: no adenopathy, no JVD, supple, symmetrical, trachea midline and thyroid not enlarged, symmetric, no tenderness/mass/nodules Lymph nodes: Cervical, supraclavicular, and axillary nodes normal. Resp: clear to auscultation bilaterally Back: symmetric, no curvature. ROM normal. No CVA tenderness. Cardio: regular rate and rhythm, S1, S2 normal, no murmur, click, rub or gallop GI: soft, non-tender; bowel sounds normal; no masses,  no organomegaly Extremities: extremities normal, atraumatic, no cyanosis or edema Neurologic: Alert and oriented X 3, normal strength and tone. Normal symmetric reflexes. Normal coordination and gait Skin exam: 5 CM area of macular rash with vesicles suspicious for herpes zoster.  ECOG PERFORMANCE STATUS: 1 - Symptomatic but completely ambulatory  Blood pressure 152/98, pulse 74, temperature 97.3 F (36.3 C), temperature source Oral, resp. rate 20, height 5' 8"  (1.727 m), weight 170 lb 12.8 oz (77.474 kg).  LABORATORY DATA: Lab Results  Component Value Date   WBC 5.5 10/06/2013   HGB 13.0 10/06/2013   HCT 39.5 10/06/2013   MCV 94.0 10/06/2013   PLT 139* 10/06/2013      Chemistry      Component Value Date/Time   NA 139 10/06/2013 1131   NA 131* 04/15/2013 0500   K 4.4 10/06/2013 1131   K 4.1 04/15/2013 0500   CL 96 04/15/2013 0500   CO2 30* 10/06/2013 1131   CO2 22 04/15/2013 0500   BUN 26.8* 10/06/2013 1131   BUN 17 04/15/2013 0500   CREATININE 1.7* 10/06/2013 1131   CREATININE 1.27 04/15/2013 0500       Component Value Date/Time   CALCIUM 9.9 10/06/2013 1131   CALCIUM 8.4 04/15/2013 0500   ALKPHOS 61 10/06/2013 1131   ALKPHOS 42 04/15/2013 0500   AST 18 10/06/2013 1131   AST 15 04/15/2013 0500   ALT 15 10/06/2013 1131   ALT 9 04/15/2013 0500   BILITOT 0.39 10/06/2013 1131   BILITOT 0.5 04/15/2013 0500       RADIOGRAPHIC STUDIES: Ct Soft Tissue Neck W Contrast  10/07/2013   CLINICAL DATA:  Lung cancer. Chemotherapy and radiation therapy complete.  EXAM: CT CHEST WITH CONTRAST; CT ABDOMEN AND PELVIS WITH CONTRAST; CT NECK WITH CONTRAST  TECHNIQUE: Multidetector CT imaging of the neck was performed with intravenous contrast.; Multidetector CT imaging of the abdomen and pelvis was performed following the standard protocol during bolus administration of intravenous contrast.; Multidetector CT imaging of the chest was performed following the standard protocol during bolus administration of intravenous contrast.  CONTRAST:  34m OMNIPAQUE IOHEXOL 300 MG/ML  SOLN  COMPARISON:  CT 07/14/2013, PET-CT 12/18/2012  FINDINGS:   CT NECK FINDINGS  No cervical lymphadenopathy. Limited view of the inferior brain and orbits are unremarkable. Salivary glands are normal. No pharyngeal mucosal abnormality. The larynx is normal.  The supraclavicular lymphadenopathy described on comparison exams is minimal. There is a 8 mm right supraclavicular node. Thyroid gland is normal.  CT CHEST FINDINGS  There is no axillary or supraclavicular lymphadenopathy. The subcarinal lymph node measuring 15 mm is increased from 7 mm on prior. No central pulmonary embolism. Small pericardial effusion is unchanged.  Review of the lung parenchyma demonstrates multiple bilateral round pulmonary metastasis. These pulmonary nodules measure larger than comparison exam. The largest nodule is in the right lower lobe measures 16 mm (image 32) increased from 10 mm on prior. Right upper lobe 7 mm nodule (image 14) is increased from 3 mm on  prior. Left lower lobe pulmonary nodule measures 10 mm (image 29) increased from 7 mm on prior. Left upper lobe nodule measures 9 mm (image 26) increased from 7 mm on prior. Left upper lobe paramediastinal thickening is not changed.    CT ABDOMEN AND PELVIS FINDINGS  No focal hepatic lesion. The pancreas, spleen, an dadrenal glands are normal. There is bilateral simple fluid density renal cysts.  The stomach, small bowel, and colon are normal. There is a left inguinal hernia which contains a short segment of nonobstructed descending colon.  Abdominal aorta demonstrates an endovascular graft which is patent. Excluded aneurysm sac is slight contracted compared to prior.  No free fluid the pelvis. The prostate gland is normal. The prostate gland is enlarged. The bladder is normal. Evaluate of the pelvis is limited by streak artifact from the hip prosthetics. No aggressive osseous lesion.    IMPRESSION: 1. Interval increase in size of bilateral pulmonary nodules consistent with pulmonary metastasis progression. 2. No evidence of cervical lymphadenopathy or adenopathy in the neck. 3. No evidence of metastasis in the abdomen or pelvis. 4. Left inguinal hernia contains a nonobstructed loop of descending colon.   Electronically Signed   By: Suzy Bouchard M.D.   On: 10/07/2013 15:04    ASSESSMENT AND PLAN: This is a very pleasant 80 years old white male with metastatic non-small cell lung cancer, adenocarcinoma with negative EGFR mutation and negative ALK gene translocation currently on systemic chemotherapy with carboplatin and Alimta status post 6 cycles.  His recent CT scan of the chest showed evidence for increase in the size of bilateral pulmonary nodules consistent with progression of his pulmonary metastases. I discussed the scan results with the patient and his family today and showed them the images. I had another lengthy discussion with the patient and his family about his treatment options. His serum  creatinine is elevated today. I gave the patient several options for his treatment including systemic chemotherapy with single agent Alimta after improvement of his serum creatinine versus treatment with oral Tarceva 150 mg by mouth daily versus continuous observation and palliative care. I discussed these 3 options in detail with the patient and his family. They were unable to make a decision today regarding his treatment and he requested more time to think about these options. He will call the clinic with his decision in the next few days. For the skin rash suspicious for herpes zoster, I started the patient on Famvir 500 mg by mouth twice a day for 7 days. He was also advised to see his dermatologist if no improvement in his skin rash. I did not give the patient a followup appointment but awaiting his decision regarding the treatment.  The patient and his family had several questions today and I answered them completely to their satisfactions. The patient was advised to call immediately if he has any concerning symptoms in the interval.  I spent 30 minutes counseling the patient face to face. The total  time spent in the appointment was 35 minutes.  Disclaimer: This note was dictated with voice recognition software. Similar sounding words can inadvertently be transcribed and may not be corrected upon review.

## 2013-10-09 ENCOUNTER — Telehealth: Payer: Self-pay | Admitting: *Deleted

## 2013-10-09 ENCOUNTER — Encounter: Payer: Self-pay | Admitting: Internal Medicine

## 2013-10-09 NOTE — Telephone Encounter (Signed)
Pt called wanting to know if he can use calamine lotion on his shingles rash.  Spoke with Ned Card, NP.  Recommended he continue the antiviral for now and to hold off on any type of lotions.  Also advised he f/u with his dermatologist.  He has an appt with them on 10/14/13.    He also states that he wants to take the alimta.  He asked what he needed to do to help his creatinine function.  Advised he try to stay hydrated with at least 64 ounces daily.  Will let Dr Vista Mink that he would like to try to start alimta and what Dr Vista Mink recommends is the right time for lab and f/u.  Inbasket msg sent to Dr Vista Mink.  SLJ

## 2013-10-13 ENCOUNTER — Other Ambulatory Visit: Payer: Self-pay | Admitting: Medical Oncology

## 2013-10-13 ENCOUNTER — Telehealth: Payer: Self-pay | Admitting: Internal Medicine

## 2013-10-13 ENCOUNTER — Telehealth: Payer: Self-pay | Admitting: Medical Oncology

## 2013-10-13 DIAGNOSIS — C3490 Malignant neoplasm of unspecified part of unspecified bronchus or lung: Secondary | ICD-10-CM

## 2013-10-13 DIAGNOSIS — B370 Candidal stomatitis: Secondary | ICD-10-CM

## 2013-10-13 MED ORDER — FLUCONAZOLE 200 MG PO TABS: 200.0000 mg | ORAL_TABLET | Freq: Once | ORAL | Status: DC

## 2013-10-13 NOTE — Telephone Encounter (Signed)
Requests refill for diflucan for oral thrush. Also asking about tarceva vs Alimta . Has insurance concerns about tarceva. Concerned about renal fx with alimta. I sent POF to schedule f/u labs and visit.

## 2013-10-13 NOTE — Telephone Encounter (Signed)
[name] and Marjory Lies informed re f/u labs and ov.

## 2013-10-13 NOTE — Telephone Encounter (Signed)
Tyler [name] report spt has oral thrush . Per Julien Nordmann i called in diflucan. I sent POF to schedule f/u labs and f/u visit.

## 2013-10-13 NOTE — Telephone Encounter (Signed)
S/w the pt's caregiver and she is aware of the lab appt on 10/20/2013 and she will pick up a schedule when they come in for that appt.

## 2013-10-20 ENCOUNTER — Other Ambulatory Visit (HOSPITAL_BASED_OUTPATIENT_CLINIC_OR_DEPARTMENT_OTHER): Payer: Medicare Other

## 2013-10-20 ENCOUNTER — Telehealth: Payer: Self-pay | Admitting: *Deleted

## 2013-10-20 DIAGNOSIS — C3490 Malignant neoplasm of unspecified part of unspecified bronchus or lung: Secondary | ICD-10-CM

## 2013-10-20 LAB — COMPREHENSIVE METABOLIC PANEL (CC13)
ALK PHOS: 55 U/L (ref 40–150)
ALT: 13 U/L (ref 0–55)
AST: 16 U/L (ref 5–34)
Albumin: 3.7 g/dL (ref 3.5–5.0)
Anion Gap: 7 mEq/L (ref 3–11)
BUN: 32.6 mg/dL — ABNORMAL HIGH (ref 7.0–26.0)
CO2: 27 mEq/L (ref 22–29)
Calcium: 9.6 mg/dL (ref 8.4–10.4)
Chloride: 100 mEq/L (ref 98–109)
Creatinine: 1.8 mg/dL — ABNORMAL HIGH (ref 0.7–1.3)
Glucose: 99 mg/dl (ref 70–140)
Potassium: 4.9 mEq/L (ref 3.5–5.1)
Sodium: 134 mEq/L — ABNORMAL LOW (ref 136–145)
Total Bilirubin: 0.37 mg/dL (ref 0.20–1.20)
Total Protein: 6.8 g/dL (ref 6.4–8.3)

## 2013-10-20 LAB — CBC WITH DIFFERENTIAL/PLATELET
BASO%: 0.2 % (ref 0.0–2.0)
BASOS ABS: 0 10*3/uL (ref 0.0–0.1)
EOS ABS: 0.2 10*3/uL (ref 0.0–0.5)
EOS%: 4 % (ref 0.0–7.0)
HCT: 39.6 % (ref 38.4–49.9)
HEMOGLOBIN: 13.2 g/dL (ref 13.0–17.1)
LYMPH%: 18.5 % (ref 14.0–49.0)
MCH: 31 pg (ref 27.2–33.4)
MCHC: 33.2 g/dL (ref 32.0–36.0)
MCV: 93.3 fL (ref 79.3–98.0)
MONO#: 0.5 10*3/uL (ref 0.1–0.9)
MONO%: 8.9 % (ref 0.0–14.0)
NEUT%: 68.4 % (ref 39.0–75.0)
NEUTROS ABS: 3.9 10*3/uL (ref 1.5–6.5)
PLATELETS: 180 10*3/uL (ref 140–400)
RBC: 4.25 10*6/uL (ref 4.20–5.82)
RDW: 14.4 % (ref 11.0–14.6)
WBC: 5.8 10*3/uL (ref 4.0–10.3)
lymph#: 1.1 10*3/uL (ref 0.9–3.3)

## 2013-10-20 NOTE — Telephone Encounter (Signed)
Cr. 1.8, Per Dr Vista Mink, pt cannot take alimta, tarceva is his option based on his cr level.  Called and left msg on home # to call back.

## 2013-10-21 ENCOUNTER — Telehealth: Payer: Self-pay | Admitting: *Deleted

## 2013-10-21 DIAGNOSIS — C3490 Malignant neoplasm of unspecified part of unspecified bronchus or lung: Secondary | ICD-10-CM

## 2013-10-21 NOTE — Telephone Encounter (Signed)
Per Dr Vista Mink, Cr 1.8 in [facility] results 10/20/13, he will not be able to take alimta due to elevated cr.  Tarceva is what Dr Vista Mink recommends.  Pt was upset and states "this is very bad news, we are about go to out of town for 4 days".  Informed him that Dr Vista Mink will discuss further at his f/u visit next week.  Moved lab appointment to earlier on 5/13 so the CMET results will be back by the time Dr Vista Mink sees the patient on 5/13.  SLJ

## 2013-10-28 ENCOUNTER — Encounter: Payer: Self-pay | Admitting: Internal Medicine

## 2013-10-28 ENCOUNTER — Other Ambulatory Visit: Payer: Medicare Other

## 2013-10-28 ENCOUNTER — Ambulatory Visit: Payer: Medicare Other | Admitting: Internal Medicine

## 2013-10-28 ENCOUNTER — Telehealth: Payer: Self-pay | Admitting: *Deleted

## 2013-10-28 ENCOUNTER — Ambulatory Visit (HOSPITAL_BASED_OUTPATIENT_CLINIC_OR_DEPARTMENT_OTHER): Payer: Medicare Other | Admitting: Internal Medicine

## 2013-10-28 ENCOUNTER — Ambulatory Visit (HOSPITAL_BASED_OUTPATIENT_CLINIC_OR_DEPARTMENT_OTHER): Payer: Medicare Other

## 2013-10-28 ENCOUNTER — Telehealth: Payer: Self-pay | Admitting: Internal Medicine

## 2013-10-28 ENCOUNTER — Other Ambulatory Visit (HOSPITAL_BASED_OUTPATIENT_CLINIC_OR_DEPARTMENT_OTHER): Payer: Medicare Other

## 2013-10-28 VITALS — BP 139/79 | HR 73 | Temp 97.8°F | Resp 19 | Ht 68.0 in | Wt 173.1 lb

## 2013-10-28 DIAGNOSIS — C3410 Malignant neoplasm of upper lobe, unspecified bronchus or lung: Secondary | ICD-10-CM

## 2013-10-28 DIAGNOSIS — R5383 Other fatigue: Secondary | ICD-10-CM

## 2013-10-28 DIAGNOSIS — N289 Disorder of kidney and ureter, unspecified: Secondary | ICD-10-CM

## 2013-10-28 DIAGNOSIS — C3490 Malignant neoplasm of unspecified part of unspecified bronchus or lung: Secondary | ICD-10-CM

## 2013-10-28 DIAGNOSIS — R5381 Other malaise: Secondary | ICD-10-CM

## 2013-10-28 DIAGNOSIS — R911 Solitary pulmonary nodule: Secondary | ICD-10-CM

## 2013-10-28 LAB — COMPREHENSIVE METABOLIC PANEL (CC13)
ALT: 18 U/L (ref 0–55)
ANION GAP: 12 meq/L — AB (ref 3–11)
AST: 14 U/L (ref 5–34)
Albumin: 3.6 g/dL (ref 3.5–5.0)
Alkaline Phosphatase: 56 U/L (ref 40–150)
BILIRUBIN TOTAL: 0.4 mg/dL (ref 0.20–1.20)
BUN: 32.9 mg/dL — AB (ref 7.0–26.0)
CO2: 27 meq/L (ref 22–29)
CREATININE: 1.7 mg/dL — AB (ref 0.7–1.3)
Calcium: 9.7 mg/dL (ref 8.4–10.4)
Chloride: 98 mEq/L (ref 98–109)
GLUCOSE: 127 mg/dL (ref 70–140)
Potassium: 4.4 mEq/L (ref 3.5–5.1)
SODIUM: 137 meq/L (ref 136–145)
TOTAL PROTEIN: 6.6 g/dL (ref 6.4–8.3)

## 2013-10-28 LAB — CBC WITH DIFFERENTIAL/PLATELET
BASO%: 0.6 % (ref 0.0–2.0)
Basophils Absolute: 0 10*3/uL (ref 0.0–0.1)
EOS ABS: 0.2 10*3/uL (ref 0.0–0.5)
EOS%: 3.6 % (ref 0.0–7.0)
HEMATOCRIT: 38.7 % (ref 38.4–49.9)
HGB: 12.7 g/dL — ABNORMAL LOW (ref 13.0–17.1)
LYMPH%: 13.6 % — AB (ref 14.0–49.0)
MCH: 30.2 pg (ref 27.2–33.4)
MCHC: 32.8 g/dL (ref 32.0–36.0)
MCV: 92.1 fL (ref 79.3–98.0)
MONO#: 0.8 10*3/uL (ref 0.1–0.9)
MONO%: 11.5 % (ref 0.0–14.0)
NEUT%: 70.7 % (ref 39.0–75.0)
NEUTROS ABS: 4.7 10*3/uL (ref 1.5–6.5)
PLATELETS: 155 10*3/uL (ref 140–400)
RBC: 4.2 10*6/uL (ref 4.20–5.82)
RDW: 14.8 % — ABNORMAL HIGH (ref 11.0–14.6)
WBC: 6.6 10*3/uL (ref 4.0–10.3)
lymph#: 0.9 10*3/uL (ref 0.9–3.3)

## 2013-10-28 MED ORDER — CYANOCOBALAMIN 1000 MCG/ML IJ SOLN
1000.0000 ug | Freq: Once | INTRAMUSCULAR | Status: AC
  Administered 2013-10-28: 1000 ug via INTRAMUSCULAR

## 2013-10-28 NOTE — Progress Notes (Signed)
[place] Telephone:([phone]   Fax:(336) [phone]  OFFICE PROGRESS NOTE  [name], MD [address] [place] [address]  DIAGNOSIS AND STAGE: Metastatic non-small cell lung cancer, adenocarcinoma presented with left upper lobe lung mass in addition to extensive supraclavicular, mediastinal adenopathy and bilateral pulmonary nodules diagnosed in July of 2014. EGFR mutation as well as ALK gene translocation are negative.   PRIOR THERAPY:  1) Palliative radiotherapy to the enlarged mediastinal and supraclavicular lymph nodes under the care of Dr. Tammi Klippel.  2) Systemic chemotherapy with carboplatin for AUC of 5 and Alimta 500 mg/M2 giving every 3 weeks. First dose given on 01/21/2013. Status post 6 cycles.   CURRENT THERAPY: Observation.  CHEMOTHERAPY INTENT: Palliative  CURRENT # OF CHEMOTHERAPY CYCLES: 0 CURRENT ANTIEMETICS: Zofran, dexamethasone and Compazine  CURRENT SMOKING STATUS: currently a nonsmoker  ORAL CHEMOTHERAPY AND CONSENT: None  CURRENT BISPHOSPHONATES USE: None  PAIN MANAGEMENT: No pain  NARCOTICS INDUCED CONSTIPATION: N/A  LIVING WILL AND CODE STATUS: Full code   INTERVAL HISTORY: Tyler [name] 80 y.o. male returns to the clinic today for followup visit accompanied by his girlfriend, [name]. The patient is doing fine these days except for mild fatigue. He is able to walk regularly in the park as well as doing a lot of activities including fishing and bowling. He was treated recently for shingles with a course of Famvir but still have some itching in that area of the back. He has no significant weight loss or night sweats. He has no nausea or vomiting. I discussed with him previously his treatment options including treatment with Alimta as a single agent after improvement of his renal function versus oral Tarceva. The patient has been reluctant to start Tarceva and he is here today for reevaluation and more detailed  discussion of his treatment options.  MEDICAL HISTORY: Past Medical History  Diagnosis Date  . BENIGN PROSTATIC HYPERTROPHY 07/23/2008  . 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 HIP PAIN, LEFT 07/04/2007  . HYPERLIPIDEMIA 01/02/2007  . HYPERTENSION 01/02/2007  . INSOMNIA 11/16/2008  . PROSTATE SPECIFIC ANTIGEN, ELEVATED 09/10/2008  . AAA (abdominal aortic aneurysm)   . Dysrhythmia     afib  . Shortness of breath   . COPD (chronic obstructive pulmonary disease)   . GERD (gastroesophageal reflux disease)   . Anemia   . History of blood transfusion   . Lung cancer dx'd 11/2012    metastatic non small cell lung cancer, adenocarcinoma     ALLERGIES:  is allergic to vicodin.  MEDICATIONS:  Current Outpatient Prescriptions  Medication Sig Dispense Refill  . acetaminophen (TYLENOL) 325 MG tablet Take 650 mg by mouth every 6 (six) hours as needed.      . cholecalciferol (VITAMIN D) 1000 UNITS tablet Take 1,000 Units by mouth every morning.       . multivitamin-iron-minerals-folic acid (CENTRUM) chewable tablet Chew 1 tablet by mouth every morning.       Marland Kitchen omeprazole (PRILOSEC) 20 MG capsule       . propranolol (INDERAL) 10 MG tablet Take 1 tablet by mouth 2 (two) times daily.       No current facility-administered medications for this visit.    SURGICAL HISTORY:  Past Surgical History  Procedure Laterality Date  . Transurethral resection of prostate  2004  . Total hip arthroplasty  2010, 2011    2010- left; 2011- right  . Upper gastrointestinal endoscopy    .  Abdominal aortic endovascular stent graft N/A 04/14/2013    Procedure: ABDOMINAL AORTIC ENDOVASCULAR STENT GRAFT;  Surgeon: Conrad Santee, MD;  Location: [facility] OR;  Service: Vascular;  Laterality: N/A;    REVIEW OF SYSTEMS:  Constitutional: positive for anorexia and fatigue Eyes: negative Ears, nose, mouth, throat, and face: positive for hoarseness.  Respiratory: positive for cough and dyspnea on exertion Cardiovascular:  negative Gastrointestinal: negative Genitourinary:negative Integument/breast: negative Hematologic/lymphatic: negative Musculoskeletal: negative. Neurological: negative Behavioral/Psych: positive for anxiety, depression and fatigue Endocrine: negative Allergic/Immunologic: negative   PHYSICAL EXAMINATION: General appearance: alert, cooperative, fatigued and no distress Head: Normocephalic, without obvious abnormality, atraumatic Neck: no adenopathy, no JVD, supple, symmetrical, trachea midline and thyroid not enlarged, symmetric, no tenderness/mass/nodules Lymph nodes: Cervical, supraclavicular, and axillary nodes normal. Resp: clear to auscultation bilaterally Back: symmetric, no curvature. ROM normal. No CVA tenderness. Cardio: regular rate and rhythm, S1, S2 normal, no murmur, click, rub or gallop GI: soft, non-tender; bowel sounds normal; no masses,  no organomegaly Extremities: extremities normal, atraumatic, no cyanosis or edema Neurologic: Alert and oriented X 3, normal strength and tone. Normal symmetric reflexes. Normal coordination and gait   ECOG PERFORMANCE STATUS: 1 - Symptomatic but completely ambulatory  Blood pressure 139/79, pulse 73, temperature 97.8 F (36.6 C), temperature source Oral, resp. rate 19, height 5' 8"  (1.727 m), weight 173 lb 1.6 oz (78.518 kg).  LABORATORY DATA: Lab Results  Component Value Date   WBC 6.6 10/28/2013   HGB 12.7* 10/28/2013   HCT 38.7 10/28/2013   MCV 92.1 10/28/2013   PLT 155 10/28/2013      Chemistry      Component Value Date/Time   NA 137 10/28/2013 1023   NA 131* 04/15/2013 0500   K 4.4 10/28/2013 1023   K 4.1 04/15/2013 0500   CL 96 04/15/2013 0500   CO2 27 10/28/2013 1023   CO2 22 04/15/2013 0500   BUN 32.9* 10/28/2013 1023   BUN 17 04/15/2013 0500   CREATININE 1.7* 10/28/2013 1023   CREATININE 1.27 04/15/2013 0500      Component Value Date/Time   CALCIUM 9.7 10/28/2013 1023   CALCIUM 8.4 04/15/2013 0500   ALKPHOS 56  10/28/2013 1023   ALKPHOS 42 04/15/2013 0500   AST 14 10/28/2013 1023   AST 15 04/15/2013 0500   ALT 18 10/28/2013 1023   ALT 9 04/15/2013 0500   BILITOT 0.40 10/28/2013 1023   BILITOT 0.5 04/15/2013 0500       RADIOGRAPHIC STUDIES:   ASSESSMENT AND PLAN: This is a very pleasant 80 years old white male with metastatic non-small cell lung cancer, adenocarcinoma with negative EGFR mutation and negative ALK gene translocation currently on systemic chemotherapy with carboplatin and Alimta status post 6 cycles.  His recent CT scan of the chest showed evidence for increase in the size of bilateral pulmonary nodules consistent with progression of his pulmonary metastases. I had a lengthy discussion with the patient and his girlfriend today about his current disease status and treatment options again.  I gave him the option of treatment with oral Tarceva versus systemic chemotherapy with Alimta that this would be given with reduced dose because of his renal insufficiency. He is very interested in proceeding with systemic therapy with Alimta other than Tarceva. He'll be treated with Alimta 400 mg/M2 every 3 weeks. I reminded the patient adverse effect of this treatment including but not limited to mild alopecia, myelosuppression, nausea and vomiting, peripheral neuropathy, liver or renal dysfunction. He has tentatively planned  to go to [place] in around 10 days and would like to start his treatment after she comes back to [place]. I will arrange for a cycle of this treatment to be done on 11/11/2013. He'll receive vitamin B12 injection today and the patient was advised to resume his treatment with folic acid and Decadron premedications. He would come back for followup visit in one month's with the start of cycle #2. The patient was advised to call immediately if he has any concerning symptoms in the interval.  I spent 20 minutes counseling the patient face to face. The total time spent in the  appointment was 30 minutes.  Disclaimer: This note was dictated with voice recognition software. Similar sounding words can inadvertently be transcribed and may not be corrected upon review.

## 2013-10-28 NOTE — Telephone Encounter (Signed)
Gave pt appt for lab and MD , emailed Sharyn Lull regarding chemo

## 2013-10-28 NOTE — Telephone Encounter (Signed)
Per staff message and POF I have tried to  schedule appts.  Advised scheduler no avialbale until Friday.  JMW

## 2013-11-01 ENCOUNTER — Other Ambulatory Visit: Payer: Self-pay | Admitting: Internal Medicine

## 2013-11-02 ENCOUNTER — Telehealth: Payer: Self-pay | Admitting: Internal Medicine

## 2013-11-02 ENCOUNTER — Telehealth: Payer: Self-pay | Admitting: *Deleted

## 2013-11-02 NOTE — Telephone Encounter (Signed)
Talked to pt and he is already aware of appt for May

## 2013-11-02 NOTE — Telephone Encounter (Signed)
Per staff message and POF I have scheduled appts.  JMW  

## 2013-11-02 NOTE — Telephone Encounter (Signed)
Patient called and moved the appt on 5/28 to later in the day

## 2013-11-10 ENCOUNTER — Ambulatory Visit (INDEPENDENT_AMBULATORY_CARE_PROVIDER_SITE_OTHER): Payer: Medicare Other | Admitting: Pulmonary Disease

## 2013-11-10 ENCOUNTER — Encounter: Payer: Self-pay | Admitting: Pulmonary Disease

## 2013-11-10 VITALS — BP 132/74 | HR 74 | Ht 68.0 in | Wt 173.0 lb

## 2013-11-10 DIAGNOSIS — J449 Chronic obstructive pulmonary disease, unspecified: Secondary | ICD-10-CM

## 2013-11-10 MED ORDER — IPRATROPIUM-ALBUTEROL 18-103 MCG/ACT IN AERO: 1.0000 | INHALATION_SPRAY | RESPIRATORY_TRACT | Status: DC | PRN

## 2013-11-10 NOTE — Assessment & Plan Note (Addendum)
The shortness of breath he has been experiencing lately is most likely due to his severe COPD.  Plan: - Combivent inhaler every 4 hours when necessary shortness of breath and used before exercise -If no improvement consider long-acting bronchodilator (that he never did well with these in the past) - Given prescription for prednisone and azithromycin to use for increased shortness of breath, wheezing, and mucus production for several days on end

## 2013-11-10 NOTE — Progress Notes (Signed)
Subjective:    Patient ID: ISACK LAVALLEY, male    DOB: 06-Oct-1932, 80 y.o.   MRN: [id number]  Synopsis: Mr. Salsgiver first saw the [facility] in June of 2014. On that visit he was diagnosed with moderate COPD as well as stage IV non-small cell lung cancer. He started radiation therapy for his lung cancer in July 2014 and is due to start chemotherapy early August 2014.  HPI   01/19/2013 routine office visit>>  [place] has been struggling with cough since the last visit. He feels that radiation therapy has been making this worse. He has frequent secretions throughout the day and the constant sensation that he needs to clear his throat. This is worse when he lies down at night. It is worse after radiation therapy. He is coughing spells frequently which are quite bothersome. Shortness of breath is about the same since the last visit. In general, he feels much weaker and much more fatigue since the last visit. He sleeps frequently during the day. He continues to take the Spiriva daily.  04/13/2013 ROV > [place] is having a lot of fatigue and states that he sleep s a lot.  He still gets out and walks with [name] occasionally.  It has increased some.  He feels that the cough syrup with codeine is helping with his cough quite a bit.   He is still having the phlegm in his throat ayet better than before.  His appetite is improved.  He was hospitalized for afib recently.  His reflux is better.  His last radiation treaetment was two months ago.  Nov 10 2013 ROV> Mihailo has been experiencing more shortness of breath since the last visit. He says that this is been periodic. Some days are worse more than others. Typically when the weather is hot humidity feels increasing shortness of breath and inability to get enough air in. He has also had cough and chest congestion on these days. He has not had hemoptysis. His weight has been stable. He recently found out that his lung cancer patient signs of progression. He is  to start more chemotherapy and today's.  Past Medical History  Diagnosis Date  . BENIGN PROSTATIC HYPERTROPHY 07/23/2008  . 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 HIP PAIN, LEFT 07/04/2007  . HYPERLIPIDEMIA 01/02/2007  . HYPERTENSION 01/02/2007  . INSOMNIA 11/16/2008  . PROSTATE SPECIFIC ANTIGEN, ELEVATED 09/10/2008  . AAA (abdominal aortic aneurysm)   . Dysrhythmia     afib  . Shortness of breath   . COPD (chronic obstructive pulmonary disease)   . GERD (gastroesophageal reflux disease)   . Anemia   . History of blood transfusion   . Lung cancer dx'd 11/2012    metastatic non small cell lung cancer, adenocarcinoma      Review of Systems  Constitutional: Positive for fatigue. Negative for fever and chills.  HENT: Negative for congestion, nosebleeds, postnasal drip and rhinorrhea.   Respiratory: Positive for shortness of breath. Negative for cough and wheezing.   Cardiovascular: Negative for chest pain and leg swelling.       Objective:   Physical Exam   Filed Vitals:   11/10/13 1656  BP: 132/74  Pulse: 74  Height: 5\' 8"  (1.727 m)  Weight: 173 lb (78.472 kg)  SpO2: 96%   RA  Gen: chronically ill appearing, no acute distress HEENT: NCAT, EOMi, OP clear,  PULM: few crackls left base CV: RRR, no mgr, no JVD AB: BS+, soft, nontender, no hsm Ext: warm,  no edema, no clubbing, no cyanosis  12/08/2012 simple spirometry ratio 55%, FEV1 0.94 L (30% predicted)     Assessment & Plan:   COPD, moderate The shortness of breath he has been experiencing lately is most likely due to his severe COPD.  Plan: - Combivent inhaler every 4 hours when necessary shortness of breath and used before exercise -If no improvement consider long-acting bronchodilator (that he never did well with these in the past) - Given prescription for prednisone and azithromycin to use for increased shortness of breath, wheezing, and mucus production for several days on end    Updated Medication  List Outpatient Encounter Prescriptions as of 11/10/2013  Medication Sig  . acetaminophen (TYLENOL) 325 MG tablet Take 650 mg by mouth every 6 (six) hours as needed.  . cholecalciferol (VITAMIN D) 1000 UNITS tablet Take 1,000 Units by mouth every morning.   . folic acid (FOLVITE) 1 MG tablet Take 1 mg by mouth daily.  . multivitamin-iron-minerals-folic acid (CENTRUM) chewable tablet Chew 1 tablet by mouth every morning.   . propranolol (INDERAL) 10 MG tablet Take 1 tablet by mouth 2 (two) times daily.  . [DISCONTINUED] omeprazole (PRILOSEC) 20 MG capsule

## 2013-11-10 NOTE — Patient Instructions (Signed)
Take the combivent respimat every four hours as needed for shortness of breath Use the prednisone and azithromycin if you have dyspnea, and chest tightness and cough for several days in a row We will see you back in 3 months or sooner if needed

## 2013-11-11 ENCOUNTER — Other Ambulatory Visit: Payer: Medicare Other

## 2013-11-12 ENCOUNTER — Other Ambulatory Visit (HOSPITAL_BASED_OUTPATIENT_CLINIC_OR_DEPARTMENT_OTHER): Payer: Medicare Other

## 2013-11-12 ENCOUNTER — Ambulatory Visit (HOSPITAL_BASED_OUTPATIENT_CLINIC_OR_DEPARTMENT_OTHER): Payer: Medicare Other

## 2013-11-12 ENCOUNTER — Telehealth: Payer: Self-pay | Admitting: Pulmonary Disease

## 2013-11-12 VITALS — BP 137/68 | HR 77 | Temp 97.6°F | Resp 18

## 2013-11-12 DIAGNOSIS — C3490 Malignant neoplasm of unspecified part of unspecified bronchus or lung: Secondary | ICD-10-CM

## 2013-11-12 DIAGNOSIS — J449 Chronic obstructive pulmonary disease, unspecified: Secondary | ICD-10-CM

## 2013-11-12 DIAGNOSIS — Z5111 Encounter for antineoplastic chemotherapy: Secondary | ICD-10-CM

## 2013-11-12 DIAGNOSIS — R0600 Dyspnea, unspecified: Secondary | ICD-10-CM

## 2013-11-12 LAB — CBC WITH DIFFERENTIAL/PLATELET
BASO%: 0.1 % (ref 0.0–2.0)
Basophils Absolute: 0 10*3/uL (ref 0.0–0.1)
EOS%: 0 % (ref 0.0–7.0)
Eosinophils Absolute: 0 10*3/uL (ref 0.0–0.5)
HCT: 37 % — ABNORMAL LOW (ref 38.4–49.9)
HGB: 12.8 g/dL — ABNORMAL LOW (ref 13.0–17.1)
LYMPH%: 6.8 % — ABNORMAL LOW (ref 14.0–49.0)
MCH: 30.4 pg (ref 27.2–33.4)
MCHC: 34.6 g/dL (ref 32.0–36.0)
MCV: 87.9 fL (ref 79.3–98.0)
MONO#: 0.8 10*3/uL (ref 0.1–0.9)
MONO%: 4.8 % (ref 0.0–14.0)
NEUT%: 88.3 % — ABNORMAL HIGH (ref 39.0–75.0)
NEUTROS ABS: 13.8 10*3/uL — AB (ref 1.5–6.5)
NRBC: 0 % (ref 0–0)
Platelets: 163 10*3/uL (ref 140–400)
RBC: 4.21 10*6/uL (ref 4.20–5.82)
RDW: 14.3 % (ref 11.0–14.6)
WBC: 15.7 10*3/uL — ABNORMAL HIGH (ref 4.0–10.3)
lymph#: 1.1 10*3/uL (ref 0.9–3.3)

## 2013-11-12 LAB — COMPREHENSIVE METABOLIC PANEL (CC13)
ALBUMIN: 3.9 g/dL (ref 3.5–5.0)
ALT: 18 U/L (ref 0–55)
AST: 17 U/L (ref 5–34)
Alkaline Phosphatase: 78 U/L (ref 40–150)
Anion Gap: 13 mEq/L — ABNORMAL HIGH (ref 3–11)
BUN: 34.5 mg/dL — ABNORMAL HIGH (ref 7.0–26.0)
CALCIUM: 9.3 mg/dL (ref 8.4–10.4)
CHLORIDE: 98 meq/L (ref 98–109)
CO2: 19 mEq/L — ABNORMAL LOW (ref 22–29)
Creatinine: 1.6 mg/dL — ABNORMAL HIGH (ref 0.7–1.3)
Glucose: 201 mg/dl — ABNORMAL HIGH (ref 70–140)
Potassium: 5.2 mEq/L — ABNORMAL HIGH (ref 3.5–5.1)
Sodium: 130 mEq/L — ABNORMAL LOW (ref 136–145)
Total Bilirubin: 0.32 mg/dL (ref 0.20–1.20)
Total Protein: 7.1 g/dL (ref 6.4–8.3)

## 2013-11-12 MED ORDER — PEMETREXED DISODIUM CHEMO INJECTION 500 MG
400.0000 mg/m2 | Freq: Once | INTRAVENOUS | Status: AC
  Administered 2013-11-12: 775 mg via INTRAVENOUS
  Filled 2013-11-12: qty 31

## 2013-11-12 MED ORDER — SODIUM CHLORIDE 0.9 % IV SOLN
Freq: Once | INTRAVENOUS | Status: AC
  Administered 2013-11-12: 15:00:00 via INTRAVENOUS

## 2013-11-12 MED ORDER — ONDANSETRON 8 MG/NS 50 ML IVPB
INTRAVENOUS | Status: AC
  Filled 2013-11-12: qty 8

## 2013-11-12 MED ORDER — DEXAMETHASONE SODIUM PHOSPHATE 10 MG/ML IJ SOLN
10.0000 mg | Freq: Once | INTRAMUSCULAR | Status: AC
  Administered 2013-11-12: 10 mg via INTRAVENOUS

## 2013-11-12 MED ORDER — ONDANSETRON 8 MG/50ML IVPB (CHCC)
8.0000 mg | Freq: Once | INTRAVENOUS | Status: AC
  Administered 2013-11-12: 8 mg via INTRAVENOUS

## 2013-11-12 MED ORDER — DEXAMETHASONE SODIUM PHOSPHATE 10 MG/ML IJ SOLN
INTRAMUSCULAR | Status: AC
  Filled 2013-11-12: qty 1

## 2013-11-12 NOTE — Telephone Encounter (Signed)
Called and spoke with pt and he stated that he meant to ask BQ yesterday at his OV.  Pt is wanting to know if there is something else that he can do--such as exercise or breathing exercises.  He is using the combivent as needed.   BQ please advise. Thanks.  Allergies  Allergen Reactions  . Vicodin [Hydrocodone-Acetaminophen] Other (See Comments)    hallucinations     Current Outpatient Prescriptions on File Prior to Visit  Medication Sig Dispense Refill  . acetaminophen (TYLENOL) 325 MG tablet Take 650 mg by mouth every 6 (six) hours as needed.      Marland Kitchen albuterol-ipratropium (COMBIVENT) 18-103 MCG/ACT inhaler Inhale 1 puff into the lungs every 4 (four) hours as needed for wheezing or shortness of breath.  1 Inhaler  5  . cholecalciferol (VITAMIN D) 1000 UNITS tablet Take 1,000 Units by mouth every morning.       . folic acid (FOLVITE) 1 MG tablet Take 1 mg by mouth daily.      . multivitamin-iron-minerals-folic acid (CENTRUM) chewable tablet Chew 1 tablet by mouth every morning.       . propranolol (INDERAL) 10 MG tablet Take 1 tablet by mouth 2 (two) times daily.       No current facility-administered medications on file prior to visit.

## 2013-11-12 NOTE — Progress Notes (Signed)
Ok to treat with creatinine of 1.6 per Dr Julien Nordmann.

## 2013-11-12 NOTE — Telephone Encounter (Signed)
We could enroll him in pulmonary rehab if he wants I think he would like that

## 2013-11-12 NOTE — Patient Instructions (Signed)
[facility] Discharge Instructions for Patients Receiving Chemotherapy  Today you received the following chemotherapy agents Alimta.  To help prevent nausea and vomiting after your treatment, we encourage you to take your nausea medication.   If you develop nausea and vomiting that is not controlled by your nausea medication, call the clinic.   BELOW ARE SYMPTOMS THAT SHOULD BE REPORTED IMMEDIATELY:  *FEVER GREATER THAN 100.5 F  *CHILLS WITH OR WITHOUT FEVER  NAUSEA AND VOMITING THAT IS NOT CONTROLLED WITH YOUR NAUSEA MEDICATION  *UNUSUAL SHORTNESS OF BREATH  *UNUSUAL BRUISING OR BLEEDING  TENDERNESS IN MOUTH AND THROAT WITH OR WITHOUT PRESENCE OF ULCERS  *URINARY PROBLEMS  *BOWEL PROBLEMS  UNUSUAL RASH Items with * indicate a potential emergency and should be followed up as soon as possible.  Feel free to call the clinic you have any questions or concerns. The clinic phone number is [phone].

## 2013-11-12 NOTE — Telephone Encounter (Signed)
LMOM x 1 cell # ATC home # cont ringing

## 2013-11-13 NOTE — Telephone Encounter (Signed)
Pt wanting to move forward with Pulmonary Rehab Order placed Nothing further needed.

## 2013-11-20 ENCOUNTER — Ambulatory Visit: Payer: Medicare Other | Admitting: Family

## 2013-11-20 ENCOUNTER — Other Ambulatory Visit (HOSPITAL_COMMUNITY): Payer: Medicare Other

## 2013-11-23 ENCOUNTER — Telehealth: Payer: Self-pay | Admitting: Pulmonary Disease

## 2013-11-23 NOTE — Telephone Encounter (Signed)
Called made pt aware it can take 4-6 weeks before pulm rehab will contact them d/t waiting list. Nothing further needed

## 2013-11-26 ENCOUNTER — Telehealth: Payer: Self-pay | Admitting: *Deleted

## 2013-11-26 NOTE — Telephone Encounter (Signed)
On 11/10/13 RX for combivent 18-103 was sent to the pharm. This is no longer available and has now been changed to [employer]. Please advise if okay to change Dr. Davene Costain

## 2013-11-26 NOTE — Telephone Encounter (Signed)
Yes fine by me

## 2013-11-27 ENCOUNTER — Other Ambulatory Visit (HOSPITAL_COMMUNITY): Payer: Medicare Other

## 2013-11-27 ENCOUNTER — Ambulatory Visit: Payer: Medicare Other | Admitting: Family

## 2013-11-27 MED ORDER — IPRATROPIUM-ALBUTEROL 20-100 MCG/ACT IN AERS: 1.0000 | INHALATION_SPRAY | Freq: Four times a day (QID) | RESPIRATORY_TRACT | Status: DC

## 2013-11-27 NOTE — Telephone Encounter (Signed)
Refill has been sent in to the pharmacy.  Nothing further is needed.

## 2013-11-30 ENCOUNTER — Telehealth: Payer: Self-pay | Admitting: *Deleted

## 2013-11-30 ENCOUNTER — Encounter: Payer: Self-pay | Admitting: *Deleted

## 2013-11-30 ENCOUNTER — Telehealth: Payer: Self-pay | Admitting: Pulmonary Disease

## 2013-11-30 ENCOUNTER — Telehealth (HOSPITAL_COMMUNITY): Payer: Self-pay | Admitting: *Deleted

## 2013-11-30 ENCOUNTER — Telehealth: Payer: Self-pay | Admitting: Vascular Surgery

## 2013-11-30 DIAGNOSIS — C3490 Malignant neoplasm of unspecified part of unspecified bronchus or lung: Secondary | ICD-10-CM

## 2013-11-30 NOTE — Telephone Encounter (Signed)
Called Pulm Rehab, spoke with Remo Lipps [facility] Re[facility] referral was placed with dx of copd.   Due to Medicare Guidelines, pts need a PFT pre and post bronchodilator when pulm rehab is ordered with copd dx. This pt has not had a post bronchodilator done.   Dr. Lake Bells, can we change the dx for pulm rehab to lung ca?  With this dx, pt will not need to have post bronchodilator pft done.

## 2013-11-30 NOTE — Telephone Encounter (Addendum)
Message copied by Gena Fray on Mon Nov 30, 2013  3:48 PM ------      Message from: Denman George      Created: Wed Nov 25, 2013  3:58 PM      Regarding: FW: recommendation       Per Dr. Bridgett Larsson, keep appt. on Friday; I think his abd. Ultrasound could be cancelled.            ----- Message -----         From: Conrad Krebs, MD         Sent: 11/25/2013   3:42 PM           To: Lynetta Mare Pullins, RN      Subject: RE: recommendation                                       Yes, routine follow up s/p EVAR            ----- Message -----         From: Sherrye Payor, RN         Sent: 11/25/2013  10:30 AM           To: Conrad Ludington, MD      Subject: recommendation                                           This pt. had a "CT chest/abd/pelvis" on 4/22; it noted "abdominal aorta demonstrates an endovascular graft which is patent. Excluded aneurysm sac is slightly contracted compared to prior."   He is questioning if he needs to keep his appt. on Friday. (he is scheduled for abdominal US and to see Vinnie Level on Friday)             ------  11/30/13: left message for patient to call back to reschedule appointment with Dr Bridgett Larsson, dpm

## 2013-11-30 NOTE — Telephone Encounter (Signed)
Pt called wanting to make sure that it was okay for pt to take proscar and flomax.  Per Dr Vista Mink it is okay for pt to take these medications, pt verbalized understanding.  SLJ

## 2013-12-01 NOTE — Telephone Encounter (Signed)
Remo Lipps is aware we will send new referral for this w/ new DX code. Order placed. Nothing further needed

## 2013-12-01 NOTE — Telephone Encounter (Signed)
Spoke with Tyler [name] to schedule an office visit with Tyler [name] on 01/01/14.

## 2013-12-01 NOTE — Telephone Encounter (Signed)
Works for me

## 2013-12-02 ENCOUNTER — Other Ambulatory Visit (HOSPITAL_BASED_OUTPATIENT_CLINIC_OR_DEPARTMENT_OTHER): Payer: Medicare Other

## 2013-12-02 ENCOUNTER — Ambulatory Visit (HOSPITAL_BASED_OUTPATIENT_CLINIC_OR_DEPARTMENT_OTHER): Payer: Medicare Other | Admitting: Internal Medicine

## 2013-12-02 ENCOUNTER — Ambulatory Visit (HOSPITAL_BASED_OUTPATIENT_CLINIC_OR_DEPARTMENT_OTHER): Payer: Medicare Other

## 2013-12-02 ENCOUNTER — Telehealth: Payer: Self-pay | Admitting: Internal Medicine

## 2013-12-02 ENCOUNTER — Encounter: Payer: Self-pay | Admitting: Internal Medicine

## 2013-12-02 VITALS — BP 137/59 | HR 78 | Temp 98.3°F | Resp 18 | Ht 68.0 in | Wt 179.6 lb

## 2013-12-02 DIAGNOSIS — Z5111 Encounter for antineoplastic chemotherapy: Secondary | ICD-10-CM

## 2013-12-02 DIAGNOSIS — C3490 Malignant neoplasm of unspecified part of unspecified bronchus or lung: Secondary | ICD-10-CM

## 2013-12-02 DIAGNOSIS — C778 Secondary and unspecified malignant neoplasm of lymph nodes of multiple regions: Secondary | ICD-10-CM

## 2013-12-02 DIAGNOSIS — C3410 Malignant neoplasm of upper lobe, unspecified bronchus or lung: Secondary | ICD-10-CM

## 2013-12-02 LAB — COMPREHENSIVE METABOLIC PANEL (CC13)
ALT: 27 U/L (ref 0–55)
ANION GAP: 10 meq/L (ref 3–11)
AST: 18 U/L (ref 5–34)
Albumin: 3.5 g/dL (ref 3.5–5.0)
Alkaline Phosphatase: 58 U/L (ref 40–150)
BILIRUBIN TOTAL: 0.43 mg/dL (ref 0.20–1.20)
BUN: 29 mg/dL — ABNORMAL HIGH (ref 7.0–26.0)
CALCIUM: 9.4 mg/dL (ref 8.4–10.4)
CO2: 26 mEq/L (ref 22–29)
Chloride: 100 mEq/L (ref 98–109)
Creatinine: 1.6 mg/dL — ABNORMAL HIGH (ref 0.7–1.3)
GLUCOSE: 118 mg/dL (ref 70–140)
POTASSIUM: 4.2 meq/L (ref 3.5–5.1)
Sodium: 136 mEq/L (ref 136–145)
Total Protein: 6.5 g/dL (ref 6.4–8.3)

## 2013-12-02 LAB — CBC WITH DIFFERENTIAL/PLATELET
BASO%: 0.1 % (ref 0.0–2.0)
Basophils Absolute: 0 10*3/uL (ref 0.0–0.1)
EOS ABS: 0.1 10*3/uL (ref 0.0–0.5)
EOS%: 1.2 % (ref 0.0–7.0)
HCT: 33.9 % — ABNORMAL LOW (ref 38.4–49.9)
HGB: 11.8 g/dL — ABNORMAL LOW (ref 13.0–17.1)
LYMPH%: 13.2 % — AB (ref 14.0–49.0)
MCH: 30.1 pg (ref 27.2–33.4)
MCHC: 34.8 g/dL (ref 32.0–36.0)
MCV: 86.5 fL (ref 79.3–98.0)
MONO#: 1.1 10*3/uL — AB (ref 0.1–0.9)
MONO%: 14.6 % — ABNORMAL HIGH (ref 0.0–14.0)
NEUT%: 70.9 % (ref 39.0–75.0)
NEUTROS ABS: 5.5 10*3/uL (ref 1.5–6.5)
PLATELETS: 226 10*3/uL (ref 140–400)
RBC: 3.92 10*6/uL — ABNORMAL LOW (ref 4.20–5.82)
RDW: 15.3 % — ABNORMAL HIGH (ref 11.0–14.6)
WBC: 7.7 10*3/uL (ref 4.0–10.3)
lymph#: 1 10*3/uL (ref 0.9–3.3)

## 2013-12-02 MED ORDER — DEXAMETHASONE SODIUM PHOSPHATE 10 MG/ML IJ SOLN
10.0000 mg | Freq: Once | INTRAMUSCULAR | Status: AC
  Administered 2013-12-02: 10 mg via INTRAVENOUS

## 2013-12-02 MED ORDER — ONDANSETRON 8 MG/50ML IVPB (CHCC)
8.0000 mg | Freq: Once | INTRAVENOUS | Status: AC
  Administered 2013-12-02: 8 mg via INTRAVENOUS

## 2013-12-02 MED ORDER — SODIUM CHLORIDE 0.9 % IV SOLN
Freq: Once | INTRAVENOUS | Status: AC
  Administered 2013-12-02: 12:00:00 via INTRAVENOUS

## 2013-12-02 MED ORDER — SODIUM CHLORIDE 0.9 % IV SOLN
400.0000 mg/m2 | Freq: Once | INTRAVENOUS | Status: AC
  Administered 2013-12-02: 775 mg via INTRAVENOUS
  Filled 2013-12-02: qty 31

## 2013-12-02 MED ORDER — DEXAMETHASONE SODIUM PHOSPHATE 10 MG/ML IJ SOLN
INTRAMUSCULAR | Status: AC
  Filled 2013-12-02: qty 1

## 2013-12-02 MED ORDER — ONDANSETRON 8 MG/NS 50 ML IVPB
INTRAVENOUS | Status: AC
  Filled 2013-12-02: qty 8

## 2013-12-02 NOTE — Patient Instructions (Signed)
[facility] Discharge Instructions for Patients Receiving Chemotherapy  Today you received the following chemotherapy agents Alimta To help prevent nausea and vomiting after your treatment, we encourage you to take your nausea medication as prescribed.  If you develop nausea and vomiting that is not controlled by your nausea medication, call the clinic.   BELOW ARE SYMPTOMS THAT SHOULD BE REPORTED IMMEDIATELY:  *FEVER GREATER THAN 100.5 F  *CHILLS WITH OR WITHOUT FEVER  NAUSEA AND VOMITING THAT IS NOT CONTROLLED WITH YOUR NAUSEA MEDICATION  *UNUSUAL SHORTNESS OF BREATH  *UNUSUAL BRUISING OR BLEEDING  TENDERNESS IN MOUTH AND THROAT WITH OR WITHOUT PRESENCE OF ULCERS  *URINARY PROBLEMS  *BOWEL PROBLEMS  UNUSUAL RASH Items with * indicate a potential emergency and should be followed up as soon as possible.  Feel free to call the clinic you have any questions or concerns. The clinic phone number is [phone].

## 2013-12-02 NOTE — Progress Notes (Signed)
Per Dr Julien Nordmann it is okay to treat pt today with chemotherapy and today's CMP result.

## 2013-12-02 NOTE — Telephone Encounter (Signed)
gv and printed appt sched and avs for pt for July....sed added tx.

## 2013-12-02 NOTE — Progress Notes (Signed)
[facility] [phone]   [phone]  OFFICE PROGRESS NOTE  [name], MD [address] [place] [address]  DIAGNOSIS AND STAGE: Metastatic non-small cell lung cancer, adenocarcinoma presented with left upper lobe lung mass in addition to extensive supraclavicular, mediastinal adenopathy and bilateral pulmonary nodules diagnosed in July of 2014. EGFR mutation as well as ALK gene translocation are negative.   PRIOR THERAPY:  1) Palliative radiotherapy to the enlarged mediastinal and supraclavicular lymph nodes under the care of Dr. Tammi Klippel.  2) Systemic chemotherapy with carboplatin for AUC of 5 and Alimta 500 mg/M2 giving every 3 weeks. First dose given on 01/21/2013. Status post 6 cycles.   CURRENT THERAPY: Systemic chemotherapy with single agent Alimta 400 mg/m2 every 3 weeks. Status post 1 cycle  CHEMOTHERAPY INTENT: Palliative  CURRENT # OF CHEMOTHERAPY CYCLES: 1 CURRENT ANTIEMETICS: Zofran, dexamethasone and Compazine  CURRENT SMOKING STATUS: currently a nonsmoker  ORAL CHEMOTHERAPY AND CONSENT: None  CURRENT BISPHOSPHONATES USE: None  PAIN MANAGEMENT: No pain  NARCOTICS INDUCED CONSTIPATION: N/A  LIVING WILL AND CODE STATUS: Full code   INTERVAL HISTORY: Tyler [name] 80 y.o. male returns to the clinic today for followup visit accompanied by his girlfriend, [name]. He tolerated the first cycle of his systemic chemotherapy with single agent Alimta fairly well with no significant adverse effects. He continues to have mild fatigue and shortness of breath with exertion. He denied having any significant chest pain, cough or hemoptysis. He denied having any significant weight loss or night sweats. He has occasional headache and feels cold inside He has no fever or chills, no nausea or vomiting. He has been very active and travel to [place] in [place] recently. He is here today to start cycle #2 of his chemotherapy.    MEDICAL HISTORY: Past Medical History  Diagnosis Date  . BENIGN PROSTATIC HYPERTROPHY 07/23/2008  . 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 HIP PAIN, LEFT 07/04/2007  . HYPERLIPIDEMIA 01/02/2007  . HYPERTENSION 01/02/2007  . INSOMNIA 11/16/2008  . PROSTATE SPECIFIC ANTIGEN, ELEVATED 09/10/2008  . AAA (abdominal aortic aneurysm)   . Dysrhythmia     afib  . Shortness of breath   . COPD (chronic obstructive pulmonary disease)   . GERD (gastroesophageal reflux disease)   . Anemia   . History of blood transfusion   . Lung cancer dx'd 11/2012    metastatic non small cell lung cancer, adenocarcinoma     ALLERGIES:  is allergic to vicodin.  MEDICATIONS:  Current Outpatient Prescriptions  Medication Sig Dispense Refill  . acetaminophen (TYLENOL) 325 MG tablet Take 650 mg by mouth every 6 (six) hours as needed.      . cholecalciferol (VITAMIN D) 1000 UNITS tablet Take 1,000 Units by mouth every morning.       . finasteride (PROSCAR) 5 MG tablet Take 5 mg by mouth daily.      . folic acid (FOLVITE) 1 MG tablet Take 1 mg by mouth daily.      . Ipratropium-Albuterol (COMBIVENT RESPIMAT) 20-100 MCG/ACT AERS respimat Inhale 1 puff into the lungs every 6 (six) hours.  4 g  6  . multivitamin-iron-minerals-folic acid (CENTRUM) chewable tablet Chew 1 tablet by mouth every morning.       . propranolol (INDERAL) 10 MG tablet Take 1 tablet by mouth 2 (two) times daily.      . tamsulosin (FLOMAX) 0.4 MG CAPS capsule Take 0.4 mg by mouth  daily.       No current facility-administered medications for this visit.    SURGICAL HISTORY:  Past Surgical History  Procedure Laterality Date  . Transurethral resection of prostate  2004  . Total hip arthroplasty  2010, 2011    2010- left; 2011- right  . Upper gastrointestinal endoscopy    . Abdominal aortic endovascular stent graft N/A 04/14/2013    Procedure: ABDOMINAL AORTIC ENDOVASCULAR STENT GRAFT;  Surgeon: Conrad [place], MD;  [facility];  Service:  Vascular;  Laterality: N/A;    REVIEW OF SYSTEMS:  Constitutional: positive for anorexia and fatigue Eyes: negative Ears, nose, mouth, throat, and face: positive for hoarseness.  Respiratory: positive for cough and dyspnea on exertion Cardiovascular: negative Gastrointestinal: negative Genitourinary:negative Integument/breast: negative Hematologic/lymphatic: negative Musculoskeletal: negative. Neurological: negative Behavioral/Psych: positive for anxiety, depression and fatigue Endocrine: negative Allergic/Immunologic: negative   PHYSICAL EXAMINATION: General appearance: alert, cooperative, fatigued and no distress Head: Normocephalic, without obvious abnormality, atraumatic Neck: no adenopathy, no JVD, supple, symmetrical, trachea midline and thyroid not enlarged, symmetric, no tenderness/mass/nodules Lymph nodes: Cervical, supraclavicular, and axillary nodes normal. Resp: clear to auscultation bilaterally Back: symmetric, no curvature. ROM normal. No CVA tenderness. Cardio: regular rate and rhythm, S1, S2 normal, no murmur, click, rub or gallop GI: soft, non-tender; bowel sounds normal; no masses,  no organomegaly Extremities: extremities normal, atraumatic, no cyanosis or edema Neurologic: Alert and oriented X 3, normal strength and tone. Normal symmetric reflexes. Normal coordination and gait   ECOG PERFORMANCE STATUS: 1 - Symptomatic but completely ambulatory  Blood pressure 137/59, pulse 78, temperature 98.3 F (36.8 C), temperature source Oral, resp. rate 18, height 5' 8" (1.727 m), weight 179 lb 9.6 oz (81.466 kg), SpO2 100.00%.  LABORATORY DATA: Lab Results  Component Value Date   WBC 7.7 12/02/2013   HGB 11.8* 12/02/2013   HCT 33.9* 12/02/2013   MCV 86.5 12/02/2013   PLT 226 12/02/2013      Chemistry      Component Value Date/Time   NA 130* 11/12/2013 1422   NA 131* 04/15/2013 0500   K 5.2 Sl. Hemolysis* 11/12/2013 1422   K 4.1 04/15/2013 0500   CL 96 04/15/2013  0500   CO2 19* 11/12/2013 1422   CO2 22 04/15/2013 0500   BUN 34.5* 11/12/2013 1422   BUN 17 04/15/2013 0500   CREATININE 1.6* 11/12/2013 1422   CREATININE 1.27 04/15/2013 0500      Component Value Date/Time   CALCIUM 9.3 11/12/2013 1422   CALCIUM 8.4 04/15/2013 0500   ALKPHOS 78 11/12/2013 1422   ALKPHOS 42 04/15/2013 0500   AST 17 11/12/2013 1422   AST 15 04/15/2013 0500   ALT 18 11/12/2013 1422   ALT 9 04/15/2013 0500   BILITOT 0.32 11/12/2013 1422   BILITOT 0.5 04/15/2013 0500       RADIOGRAPHIC STUDIES:  ASSESSMENT AND PLAN: This is a very pleasant 80 years old white male with metastatic non-small cell lung cancer, adenocarcinoma with negative EGFR mutation and negative ALK gene translocation currently on systemic chemotherapy with carboplatin and Alimta status post 6 cycles.  He is currently undergoing systemic chemotherapy with reduced dose single agent Alimta 400 mg/M2 every 3 weeks and tolerated the first cycle of his treatment fairly well. I recommended for the patient to continue his systemic chemotherapy with single agent Alimta as scheduled. He will receive cycle #2 today. He would come back for followup visit in 3 weeks with the start of cycle #3. The patient  had several questions today and I answered them completely to his satisfaction. The patient was advised to call immediately if he has any concerning symptoms in the interval.  I spent 20 minutes counseling the patient face to face. The total time spent in the appointment was 30 minutes.  Disclaimer: This note was dictated with voice recognition software. Similar sounding words can inadvertently be transcribed and may not be corrected upon review.

## 2013-12-07 ENCOUNTER — Telehealth: Payer: Self-pay | Admitting: Pulmonary Disease

## 2013-12-07 NOTE — Telephone Encounter (Signed)
Per referral 12/01/13: Forms filled out and given to dr Lake Bells to complete Joellen Jersey --  Pt was calling check on the status of form. Pt aware BW is off all this week. Please advise thanks

## 2013-12-08 ENCOUNTER — Telehealth (HOSPITAL_COMMUNITY): Payer: Self-pay | Admitting: *Deleted

## 2013-12-08 NOTE — Telephone Encounter (Signed)
pulm rehab is calling back about this and wants the order   Tyler [name] rehab [phone]

## 2013-12-08 NOTE — Telephone Encounter (Signed)
Spoke with Remo Lipps  I advised Dr Lake Bells is off this wk  Will forward him msg to be aware to sign referral for rehab when he comes back to office on 6/29  Thanks

## 2013-12-14 ENCOUNTER — Telehealth (HOSPITAL_COMMUNITY): Payer: Self-pay

## 2013-12-14 NOTE — Telephone Encounter (Signed)
I'm OK with him starting, is there something I need to actually sign or if this just an EPIC thing?

## 2013-12-14 NOTE — Telephone Encounter (Signed)
Wanting to know if dr can expedite order for pt to go to pulm rehab says his breathing is not well.Tyler [name]

## 2013-12-14 NOTE — Telephone Encounter (Signed)
Called and spoke to pt. He stated he has been waiting to go to pulmonary rehab because he feels it could really help his breathing. Pt asked if there is any way for [place] to expedite the process and have the order signed today. Will forward to BQ and Caryl Pina to make aware, if order can be signed as soon as convenient.   Order was placed 11/13/13- see phone note 11/12/13. Pt's last OV with BQ on 11/10/13

## 2013-12-15 ENCOUNTER — Other Ambulatory Visit: Payer: Self-pay | Admitting: Dermatology

## 2013-12-15 NOTE — Telephone Encounter (Signed)
Signed, available from me today

## 2013-12-15 NOTE — Telephone Encounter (Signed)
Spoke with Remo Lipps at [facility]. 1st order for pulmonary rehab was received by [facility] with diagnosis of COPD, however, pt has not had full PFT. Per Remo Lipps she has spoke with Dr. Anastasia Pall nurse last week (Dr. Lake Bells was on vacation) to complete another referral under a different diagnosis since the patient has not had PFT's. Remo Lipps has spoke with patient multiple times and is aware that Dr. Lake Bells has been on vacation. I advised Remo Lipps that I will complete another form and give this to Dr. Lake Bells today when he is in today to address this issue. Rhonda J Cobb

## 2013-12-15 NOTE — Telephone Encounter (Signed)
He had his order placed in epic on 5/29 for pulmonary rehab with a note stating that the pt wanted to discuss insurance info before beginning rehab.  This may be the holdup.  Will route to pcc's to see if there is a way to expedite his enrollment into pulmonary rehab and to verify that someone has gone over insurance with him re: his pulmonary rehab, and routing to BQ as an fyi.

## 2013-12-16 ENCOUNTER — Telehealth: Payer: Self-pay | Admitting: *Deleted

## 2013-12-16 NOTE — Telephone Encounter (Signed)
Pt called stating that he has had an inguinal hernia for about 6 years.  It has been asymptomatic until recently and whenever he sneezes or coughs he is having pain.  Per Dr Vista Mink, he is okay with referring him to a surgeon to have it repaired.  He will discuss in detail at f/u visit on 7/8.  Left vm on pt's phone with information.  SLJ

## 2013-12-22 ENCOUNTER — Encounter (HOSPITAL_COMMUNITY)
Admission: RE | Admit: 2013-12-22 | Discharge: 2013-12-22 | Disposition: A | Discharge status: Home / Self Care | Payer: Medicare Other | Source: Ambulatory Visit | Attending: Pulmonary Disease | Admitting: Pulmonary Disease

## 2013-12-22 ENCOUNTER — Telehealth: Payer: Self-pay | Admitting: Medical Oncology

## 2013-12-22 VITALS — BP 140/70 | HR 81

## 2013-12-22 DIAGNOSIS — R059 Cough, unspecified: Secondary | ICD-10-CM | POA: Diagnosis not present

## 2013-12-22 DIAGNOSIS — C3492 Malignant neoplasm of unspecified part of left bronchus or lung: Secondary | ICD-10-CM

## 2013-12-22 DIAGNOSIS — C3490 Malignant neoplasm of unspecified part of unspecified bronchus or lung: Secondary | ICD-10-CM | POA: Diagnosis not present

## 2013-12-22 DIAGNOSIS — R05 Cough: Secondary | ICD-10-CM | POA: Diagnosis not present

## 2013-12-22 DIAGNOSIS — J449 Chronic obstructive pulmonary disease, unspecified: Secondary | ICD-10-CM | POA: Diagnosis not present

## 2013-12-22 DIAGNOSIS — R0989 Other specified symptoms and signs involving the circulatory and respiratory systems: Principal | ICD-10-CM | POA: Insufficient documentation

## 2013-12-22 DIAGNOSIS — R0609 Other forms of dyspnea: Secondary | ICD-10-CM | POA: Insufficient documentation

## 2013-12-22 DIAGNOSIS — I319 Disease of pericardium, unspecified: Secondary | ICD-10-CM | POA: Insufficient documentation

## 2013-12-22 DIAGNOSIS — J4489 Other specified chronic obstructive pulmonary disease: Secondary | ICD-10-CM | POA: Insufficient documentation

## 2013-12-22 NOTE — Telephone Encounter (Signed)
Decadron instructions reviewed with [name].

## 2013-12-22 NOTE — Progress Notes (Signed)
[name] 80 y.o. male Pulmonary Rehab Orientation Note Patient arrived today in [facility]hab for orientation to Pulmonary Rehab. He was transported from [employer] via wheel chair. He does not carry portable oxygen. Per pt, he uses oxygen never. Color good, skin warm and dry. Patient is oriented to time and place. Patient's medical history and medications reviewed. Heart rate is normal, breath sounds clear to auscultation, no wheezes, rales, or rhonchi, diminished. Grip strength equal, strong. Distal pulses 1+ posterior tibial present. Patient reports he does take medications as prescribed. Patient states he** follows a Regular. The patient reports no specific efforts to gain or lose weight.  Patient's weight will be monitored closely. Demonstration and practice of PLB using pulse oximeter. Patient able to return demonstration satisfactorily. Safety and hand hygiene in the exercise area reviewed with patient. Patient voices understanding of the information reviewed. Department expectations discussed with patient and achievable goals were set. The patient shows enthusiasm about attending the program and we look forward to working with this nice gentleman. The patient is scheduled for a 6 min walk test on Thursday December 24, 2013 @ 3:45pm and to begin exercise on Tuesday, December 29, 2013.   Rosebud Poles RN [phone]

## 2013-12-23 ENCOUNTER — Encounter: Payer: Self-pay | Admitting: Physician Assistant

## 2013-12-23 ENCOUNTER — Ambulatory Visit: Payer: Medicare Other

## 2013-12-23 ENCOUNTER — Encounter: Payer: Self-pay | Admitting: Internal Medicine

## 2013-12-23 ENCOUNTER — Ambulatory Visit (HOSPITAL_BASED_OUTPATIENT_CLINIC_OR_DEPARTMENT_OTHER): Payer: Medicare Other | Admitting: Physician Assistant

## 2013-12-23 ENCOUNTER — Ambulatory Visit (HOSPITAL_BASED_OUTPATIENT_CLINIC_OR_DEPARTMENT_OTHER): Payer: Medicare Other | Admitting: *Deleted

## 2013-12-23 VITALS — BP 169/87 | HR 81 | Temp 97.8°F | Resp 20 | Ht 68.0 in | Wt 184.0 lb

## 2013-12-23 DIAGNOSIS — R0989 Other specified symptoms and signs involving the circulatory and respiratory systems: Secondary | ICD-10-CM

## 2013-12-23 DIAGNOSIS — C3410 Malignant neoplasm of upper lobe, unspecified bronchus or lung: Secondary | ICD-10-CM

## 2013-12-23 DIAGNOSIS — R5383 Other fatigue: Secondary | ICD-10-CM

## 2013-12-23 DIAGNOSIS — K4090 Unilateral inguinal hernia, without obstruction or gangrene, not specified as recurrent: Secondary | ICD-10-CM

## 2013-12-23 DIAGNOSIS — R911 Solitary pulmonary nodule: Secondary | ICD-10-CM

## 2013-12-23 DIAGNOSIS — R5381 Other malaise: Secondary | ICD-10-CM

## 2013-12-23 DIAGNOSIS — R0609 Other forms of dyspnea: Secondary | ICD-10-CM

## 2013-12-23 DIAGNOSIS — C3490 Malignant neoplasm of unspecified part of unspecified bronchus or lung: Secondary | ICD-10-CM

## 2013-12-23 DIAGNOSIS — Z5111 Encounter for antineoplastic chemotherapy: Secondary | ICD-10-CM

## 2013-12-23 LAB — CBC WITH DIFFERENTIAL/PLATELET
BASO%: 0.1 % (ref 0.0–2.0)
Basophils Absolute: 0 10*3/uL (ref 0.0–0.1)
EOS%: 0 % (ref 0.0–7.0)
Eosinophils Absolute: 0 10*3/uL (ref 0.0–0.5)
HEMATOCRIT: 32.9 % — AB (ref 38.4–49.9)
HGB: 11.2 g/dL — ABNORMAL LOW (ref 13.0–17.1)
LYMPH%: 5.5 % — AB (ref 14.0–49.0)
MCH: 30.5 pg (ref 27.2–33.4)
MCHC: 34 g/dL (ref 32.0–36.0)
MCV: 89.6 fL (ref 79.3–98.0)
MONO#: 0.6 10*3/uL (ref 0.1–0.9)
MONO%: 4.1 % (ref 0.0–14.0)
NEUT#: 13.3 10*3/uL — ABNORMAL HIGH (ref 1.5–6.5)
NEUT%: 90.3 % — ABNORMAL HIGH (ref 39.0–75.0)
PLATELETS: 234 10*3/uL (ref 140–400)
RBC: 3.67 10*6/uL — AB (ref 4.20–5.82)
RDW: 18.2 % — ABNORMAL HIGH (ref 11.0–14.6)
WBC: 14.7 10*3/uL — AB (ref 4.0–10.3)
lymph#: 0.8 10*3/uL — ABNORMAL LOW (ref 0.9–3.3)
nRBC: 0 % (ref 0–0)

## 2013-12-23 LAB — COMPREHENSIVE METABOLIC PANEL (CC13)
ALT: 43 U/L (ref 0–55)
ANION GAP: 9 meq/L (ref 3–11)
AST: 25 U/L (ref 5–34)
Albumin: 3.7 g/dL (ref 3.5–5.0)
Alkaline Phosphatase: 89 U/L (ref 40–150)
BILIRUBIN TOTAL: 0.27 mg/dL (ref 0.20–1.20)
BUN: 29.3 mg/dL — ABNORMAL HIGH (ref 7.0–26.0)
CALCIUM: 8.8 mg/dL (ref 8.4–10.4)
CHLORIDE: 100 meq/L (ref 98–109)
CO2: 23 meq/L (ref 22–29)
CREATININE: 1.3 mg/dL (ref 0.7–1.3)
Glucose: 153 mg/dl — ABNORMAL HIGH (ref 70–140)
Potassium: 5.1 mEq/L (ref 3.5–5.1)
Sodium: 131 mEq/L — ABNORMAL LOW (ref 136–145)
Total Protein: 6.8 g/dL (ref 6.4–8.3)

## 2013-12-23 MED ORDER — CYANOCOBALAMIN 1000 MCG/ML IJ SOLN
1000.0000 ug | Freq: Once | INTRAMUSCULAR | Status: AC
Start: 2013-12-23 — End: 2013-12-23
  Administered 2013-12-23: 1000 ug via INTRAMUSCULAR

## 2013-12-23 MED ORDER — CYANOCOBALAMIN 1000 MCG/ML IJ SOLN
INTRAMUSCULAR | Status: AC
Start: 2013-12-23 — End: 2013-12-23
  Filled 2013-12-23: qty 1

## 2013-12-23 MED ORDER — SODIUM CHLORIDE 0.9 % IV SOLN
Freq: Once | INTRAVENOUS | Status: AC
  Administered 2013-12-23: 16:00:00 via INTRAVENOUS

## 2013-12-23 MED ORDER — DEXAMETHASONE SODIUM PHOSPHATE 10 MG/ML IJ SOLN
INTRAMUSCULAR | Status: AC
  Filled 2013-12-23: qty 1

## 2013-12-23 MED ORDER — DEXAMETHASONE SODIUM PHOSPHATE 10 MG/ML IJ SOLN
10.0000 mg | Freq: Once | INTRAMUSCULAR | Status: AC
  Administered 2013-12-23: 10 mg via INTRAVENOUS

## 2013-12-23 MED ORDER — ONDANSETRON 8 MG/NS 50 ML IVPB
INTRAVENOUS | Status: AC
  Filled 2013-12-23: qty 8

## 2013-12-23 MED ORDER — ONDANSETRON 8 MG/50ML IVPB (CHCC)
8.0000 mg | Freq: Once | INTRAVENOUS | Status: AC
  Administered 2013-12-23: 8 mg via INTRAVENOUS

## 2013-12-23 MED ORDER — SODIUM CHLORIDE 0.9 % IV SOLN
400.0000 mg/m2 | Freq: Once | INTRAVENOUS | Status: AC
  Administered 2013-12-23: 775 mg via INTRAVENOUS
  Filled 2013-12-23: qty 31

## 2013-12-23 NOTE — Patient Instructions (Signed)
[facility] Discharge Instructions for Patients Receiving Chemotherapy  Today you received the following chemotherapy agents ALIMTA  To help prevent nausea and vomiting after your treatment, we encourage you to take your nausea medication IF NEEDED.   If you develop nausea and vomiting that is not controlled by your nausea medication, call the clinic.   BELOW ARE SYMPTOMS THAT SHOULD BE REPORTED IMMEDIATELY:  *FEVER GREATER THAN 100.5 F  *CHILLS WITH OR WITHOUT FEVER  NAUSEA AND VOMITING THAT IS NOT CONTROLLED WITH YOUR NAUSEA MEDICATION  *UNUSUAL SHORTNESS OF BREATH  *UNUSUAL BRUISING OR BLEEDING  TENDERNESS IN MOUTH AND THROAT WITH OR WITHOUT PRESENCE OF ULCERS  *URINARY PROBLEMS  *BOWEL PROBLEMS  UNUSUAL RASH Items with * indicate a potential emergency and should be followed up as soon as possible.  Feel free to call the clinic you have any questions or concerns. The clinic phone number is [phone].

## 2013-12-23 NOTE — Progress Notes (Addendum)
[facility] [phone]   [phone]  SHARED VISIT PROGRESS NOTE  [name], MD [address] [place] [address]  DIAGNOSIS AND STAGE: Metastatic non-small cell lung cancer, adenocarcinoma presented with left upper lobe lung mass in addition to extensive supraclavicular, mediastinal adenopathy and bilateral pulmonary nodules diagnosed in July of 2014. EGFR mutation as well as ALK gene translocation are negative.   PRIOR THERAPY:  1) Palliative radiotherapy to the enlarged mediastinal and supraclavicular lymph nodes under the care of Dr. Tammi Klippel.  2) Systemic chemotherapy with carboplatin for AUC of 5 and Alimta 500 mg/M2 giving every 3 weeks. First dose given on 01/21/2013. Status post 6 cycles.   CURRENT THERAPY: Systemic chemotherapy with single agent Alimta 400 mg/m2 every 3 weeks. Status post 2 cycles  CHEMOTHERAPY INTENT: Palliative  CURRENT # OF CHEMOTHERAPY CYCLES: 3 CURRENT ANTIEMETICS: Zofran, dexamethasone and Compazine  CURRENT SMOKING STATUS: currently a nonsmoker  ORAL CHEMOTHERAPY AND CONSENT: None  CURRENT BISPHOSPHONATES USE: None  PAIN MANAGEMENT: No pain  NARCOTICS INDUCED CONSTIPATION: N/A  LIVING WILL AND CODE STATUS: Full code   INTERVAL HISTORY: Tyler [name] 80 y.o. male returns to the clinic today for followup visit accompanied by his girlfriend, Tyler [name]. He is tolerating the single agent Alimta chemotherapy relatively well. His main complaints are that of his COPD which is followed by Dr. Azalee Course and his left inguinal hernia pain. He complains of waking up early in the morning on 2 separate occasions for cough which led to pain and burning sensation in the left inguinal area. He states it feels like something is tearing. He is to see Dr. Vallarie Mare in the near future for followup of his aneurysm repair. He does report that the itching that he was experiencing involving his chest and back has  resolved with prednisone that was prescribed for his COPD.  He continues to have mild fatigue and shortness of breath with exertion. He denied having any significant chest pain, cough or hemoptysis. He denied having any significant weight loss or night sweats. He has no fever or chills, no nausea or vomiting. He is here today to start cycle #3 of his chemotherapy.   MEDICAL HISTORY: Past Medical History  Diagnosis Date  . BENIGN PROSTATIC HYPERTROPHY 07/23/2008  . 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 HIP PAIN, LEFT 07/04/2007  . HYPERLIPIDEMIA 01/02/2007  . HYPERTENSION 01/02/2007  . INSOMNIA 11/16/2008  . PROSTATE SPECIFIC ANTIGEN, ELEVATED 09/10/2008  . AAA (abdominal aortic aneurysm)   . Dysrhythmia     afib  . Shortness of breath   . COPD (chronic obstructive pulmonary disease)   . GERD (gastroesophageal reflux disease)   . Anemia   . History of blood transfusion   . Lung cancer dx'd 11/2012    metastatic non small cell lung cancer, adenocarcinoma     ALLERGIES:  is allergic to vicodin.  MEDICATIONS:  Current Outpatient Prescriptions  Medication Sig Dispense Refill  . acetaminophen (TYLENOL) 325 MG tablet Take 650 mg by mouth every 6 (six) hours as needed.      . cholecalciferol (VITAMIN D) 1000 UNITS tablet Take 1,000 Units by mouth every morning.       . finasteride (PROSCAR) 5 MG tablet Take 5 mg by mouth daily.      . folic acid (FOLVITE) 1 MG tablet Take 1 mg by mouth daily.      . Ipratropium-Albuterol (COMBIVENT RESPIMAT) 20-100 MCG/ACT AERS  respimat Inhale 1 puff into the lungs every 6 (six) hours.  4 g  6  . multivitamin-iron-minerals-folic acid (CENTRUM) chewable tablet Chew 1 tablet by mouth every morning.       . propranolol (INDERAL) 10 MG tablet Take 1 tablet by mouth 2 (two) times daily.      . tamsulosin (FLOMAX) 0.4 MG CAPS capsule Take 0.4 mg by mouth daily.       No current facility-administered medications for this visit.    SURGICAL HISTORY:  Past  Surgical History  Procedure Laterality Date  . Transurethral resection of prostate  2004  . Total hip arthroplasty  2010, 2011    2010- left; 2011- right  . Upper gastrointestinal endoscopy    . Abdominal aortic endovascular stent graft N/A 04/14/2013    Procedure: ABDOMINAL AORTIC ENDOVASCULAR STENT GRAFT;  Surgeon: Conrad Southern Shops, MD;  Location: [facility] OR;  Service: Vascular;  Laterality: N/A;    REVIEW OF SYSTEMS:  Constitutional: positive for anorexia and fatigue Eyes: negative Ears, nose, mouth, throat, and face: positive for hoarseness.  Respiratory: positive for cough and dyspnea on exertion Cardiovascular: negative Gastrointestinal: negative Genitourinary: Positive for left inguinal hernia discomfort Integument/breast: negative Hematologic/lymphatic: negative Musculoskeletal: negative. Neurological: negative Behavioral/Psych: positive for anxiety, depression and fatigue Endocrine: negative Allergic/Immunologic: negative   PHYSICAL EXAMINATION: General appearance: alert, cooperative, fatigued and no distress Head: Normocephalic, without obvious abnormality, atraumatic Neck: no adenopathy, no JVD, supple, symmetrical, trachea midline and thyroid not enlarged, symmetric, no tenderness/mass/nodules Lymph nodes: Cervical, supraclavicular, and axillary nodes normal. Resp: clear to auscultation bilaterally Back: symmetric, no curvature. ROM normal. No CVA tenderness. Cardio: regular rate and rhythm, S1, S2 normal, no murmur, click, rub or gallop GI: soft, non-tender; bowel sounds normal; no masses,  no organomegaly Extremities: extremities normal, atraumatic, no cyanosis or edema Neurologic: Alert and oriented X 3, normal strength and tone. Normal symmetric reflexes. Normal coordination and gait   ECOG PERFORMANCE STATUS: 1 - Symptomatic but completely ambulatory  Blood pressure 169/87, pulse 81, temperature 97.8 F (36.6 C), temperature source Oral, resp. rate 20, height 5' 8"   (1.727 m), weight 184 lb (83.462 kg), SpO2 99.00%.  LABORATORY DATA: Lab Results  Component Value Date   WBC 14.7* 12/23/2013   HGB 11.2* 12/23/2013   HCT 32.9* 12/23/2013   MCV 89.6 12/23/2013   PLT 234 12/23/2013      Chemistry      Component Value Date/Time   NA 136 12/02/2013 1059   NA 131* 04/15/2013 0500   K 4.2 12/02/2013 1059   K 4.1 04/15/2013 0500   CL 96 04/15/2013 0500   CO2 26 12/02/2013 1059   CO2 22 04/15/2013 0500   BUN 29.0* 12/02/2013 1059   BUN 17 04/15/2013 0500   CREATININE 1.6* 12/02/2013 1059   CREATININE 1.27 04/15/2013 0500      Component Value Date/Time   CALCIUM 9.4 12/02/2013 1059   CALCIUM 8.4 04/15/2013 0500   ALKPHOS 58 12/02/2013 1059   ALKPHOS 42 04/15/2013 0500   AST 18 12/02/2013 1059   AST 15 04/15/2013 0500   ALT 27 12/02/2013 1059   ALT 9 04/15/2013 0500   BILITOT 0.43 12/02/2013 1059   BILITOT 0.5 04/15/2013 0500       RADIOGRAPHIC STUDIES:  ASSESSMENT AND PLAN: This is a very pleasant 80 years old white male with metastatic non-small cell lung cancer, adenocarcinoma with negative EGFR mutation and negative ALK gene translocation currently on systemic chemotherapy with carboplatin and Alimta status post  6 cycles.  He is currently undergoing systemic chemotherapy with reduced dose single agent Alimta 400 mg/M2 every 3 weeks and tolerated the first cycle of his treatment fairly well. Patient was discussed with and also seen by Dr. Julien Nordmann. He will proceed with cycle #3 single agent Alimta at 400 mg per meter squared given every 3 weeks today as scheduled. He'll return in 3 weeks for another symptom management visit with a restaging CT scan of the chest, abdomen and pelvis with contrast to reevaluate his disease. Regarding his inguinal hernia discomfort, the patient is advised to followup with his primary care physician and ultimately a surgeon if it continues to be problematic.  The patient was advised to call immediately if he has any concerning  symptoms in the interval.  Tyler Adam, PA-C  ADDENDUM: Hematology/Oncology Attending: I had a face to face encounter with the patient. I recommended his care plan. This is a very pleasant 80 years old white male with metastatic non-small cell lung cancer, adenocarcinoma diagnosed in July 2014 status post induction chemotherapy and he is currently on systemic chemotherapy with single agent Alimta status post 2 cycles. He is tolerating his treatment fairly well except for occasional fatigue and shortness of breath. He also has some intermittent discomfort with implant hernia. I recommended for him to see a surgeon for evaluation of this condition. I recommended for him to proceed with cycle #3 today as scheduled. The patient would come back for followup visit in 3 weeks for reevaluation after repeating CT scan of the chest, abdomen and pelvis for restaging of his disease. He was advised to call immediately if he has any concerning symptoms in the interval.  Disclaimer: This note was dictated with voice recognition software. Similar sounding words can inadvertently be transcribed and may not be corrected upon review. Eilleen Kempf., MD 12/26/2013

## 2013-12-24 ENCOUNTER — Encounter (HOSPITAL_COMMUNITY)
Admission: RE | Admit: 2013-12-24 | Discharge: 2013-12-24 | Disposition: A | Discharge status: Home / Self Care | Payer: Medicare Other | Source: Ambulatory Visit | Attending: Pulmonary Disease | Admitting: Pulmonary Disease

## 2013-12-24 DIAGNOSIS — R0609 Other forms of dyspnea: Secondary | ICD-10-CM | POA: Diagnosis not present

## 2013-12-24 NOTE — Progress Notes (Signed)
Tyler [name] completed a Six-Minute Walk Test on 12/24/13 . Tyler [name] walked 878 feet with 0 breaks.  The patient's lowest oxygen saturation was 95% , highest heart rate was 97bpm , and highest blood pressure was 160/74. The patient was on room air. Tyler [name] stated that thirst hindered their walk test.

## 2013-12-25 ENCOUNTER — Ambulatory Visit (HOSPITAL_COMMUNITY): Payer: Medicare Other

## 2013-12-25 ENCOUNTER — Telehealth: Payer: Self-pay | Admitting: Internal Medicine

## 2013-12-25 NOTE — Patient Instructions (Signed)
Followup in 3 weeks with restaging CT scan of your chest, abdomen and pelvis to reevaluate your disease.

## 2013-12-25 NOTE — Telephone Encounter (Signed)
lvm for pt regarding to July appt....mailed pt appt sched/avs and letter

## 2013-12-28 ENCOUNTER — Other Ambulatory Visit: Payer: Self-pay | Admitting: *Deleted

## 2013-12-28 DIAGNOSIS — C3490 Malignant neoplasm of unspecified part of unspecified bronchus or lung: Secondary | ICD-10-CM

## 2013-12-28 MED ORDER — FOLIC ACID 1 MG PO TABS: 1.0000 mg | ORAL_TABLET | Freq: Every day | ORAL | Status: DC

## 2013-12-29 ENCOUNTER — Encounter (HOSPITAL_COMMUNITY)
Admission: RE | Admit: 2013-12-29 | Discharge: 2013-12-29 | Disposition: A | Discharge status: Home / Self Care | Payer: Medicare Other | Source: Ambulatory Visit | Attending: Pulmonary Disease | Admitting: Pulmonary Disease

## 2013-12-29 DIAGNOSIS — R0609 Other forms of dyspnea: Secondary | ICD-10-CM | POA: Diagnosis not present

## 2013-12-31 ENCOUNTER — Encounter (HOSPITAL_COMMUNITY)
Admission: RE | Admit: 2013-12-31 | Discharge: 2013-12-31 | Disposition: A | Discharge status: Home / Self Care | Payer: Medicare Other | Source: Ambulatory Visit | Attending: Pulmonary Disease | Admitting: Pulmonary Disease

## 2013-12-31 ENCOUNTER — Encounter: Payer: Self-pay | Admitting: Vascular Surgery

## 2013-12-31 DIAGNOSIS — R0609 Other forms of dyspnea: Secondary | ICD-10-CM | POA: Diagnosis not present

## 2013-12-31 DIAGNOSIS — R0989 Other specified symptoms and signs involving the circulatory and respiratory systems: Secondary | ICD-10-CM | POA: Diagnosis not present

## 2013-12-31 NOTE — Progress Notes (Signed)
Today, Tyler [name] exercised at [employer]. Service time was from 1330 to 1530.  The patient exercised for more than 31 minutes performing aerobic, strengthening, and stretching exercises. Oxygen saturation, heart rate, blood pressure, rate of perceived exertion, and shortness of breath were all monitored before, during, and after exercise. Tyler [name] presented with no problems at today's exercise session. Tyler [name] also attended an education session on nutrition for the pulmonary patients.  There was no workload change during today's exercise session.  Pre-exercise vitals:   Weight kg: 80.1   Liters of O2: ra   SpO2: 98   HR: 80   BP: 134/84   CBG: na  Exercise vitals:   Highest heartrate:  80   Lowest oxygen saturation: 96   Highest blood pressure: 126/70   Liters of 02: ra  Post-exercise vitals:   SpO2: 99   HR: 77   BP: 118/68   Liters of O2: ra   CBG: na  Dr. Brand Males, Medical Director Dr. Aileen Fass is immediately available during today's Pulmonary Rehab session for Tyler [name] on 12/31/2013 at 1300 class time.

## 2014-01-01 ENCOUNTER — Encounter: Payer: Self-pay | Admitting: Vascular Surgery

## 2014-01-01 ENCOUNTER — Ambulatory Visit (INDEPENDENT_AMBULATORY_CARE_PROVIDER_SITE_OTHER): Payer: Medicare Other | Admitting: Vascular Surgery

## 2014-01-01 VITALS — BP 135/83 | HR 82 | Resp 18 | Ht 67.5 in | Wt 179.4 lb

## 2014-01-01 DIAGNOSIS — I714 Abdominal aortic aneurysm, without rupture, unspecified: Secondary | ICD-10-CM

## 2014-01-01 DIAGNOSIS — Z48812 Encounter for surgical aftercare following surgery on the circulatory system: Secondary | ICD-10-CM

## 2014-01-01 NOTE — Progress Notes (Addendum)
    Established EVAR  History of Present Illness  Tyler [name] is a 80 y.o. (1932-08-31) male who presents for routine follow up s/p EVAR (Date: 04/14/13).  Most recent CTA (Date: 05/22/13) demonstrates: no endoleak and  slightly decreased sac size.  The patient has no had back or abdominal pain.  The patient has been able to gain some weight and appears happier than previously.  The patient's PMH, PSH, SH, FamHx, Med, and Allergies are unchanged from 05/22/13.  On ROS today: no abd or back pain, +cough, +COPD  Physical Examination  Filed Vitals:   01/01/14 1420  BP: 135/83  Pulse: 82  Resp: 18  Height: 5' 7.5" (1.715 m)  Weight: 179 lb 6.4 oz (81.375 kg)   Body mass index is 27.67 kg/(m^2).  General: A&O x 3, WD, some wt gain  Pulmonary: Sym exp, B rales, no rhonchi, & wheezing  Cardiac: RRR, Nl S1, S2, no Murmurs, rubs or gallops  Vascular: Vessel Right Left  Radial Palpable Palpable  Brachial Palpable Palpable  Carotid Palpable, without bruit Palpable, without bruit  Aorta Not palpable N/A  Femoral Palpable Palpable  Popliteal Not palpable Not palpable  PT Palpable Faintly Palpable  DP Palpable Faintly Palpable   Gastrointestinal: soft, NTND, -G/R, - HSM, - masses, - CVAT B  Musculoskeletal: M/S 5/5 throughout , Extremities without ischemic changes   Neurologic: Pain and light touch intact in extremities , Motor exam as listed above  Medical Decision Making  Tyler [name] is a 80 y.o. male who presents s/p EVAR.  Pt is asymptomatic with previously decreasing sac size.  I discussed with the patient the importance of surveillance of the endograft.  The next endograft duplex will be scheduled for 6 months.  The next CTA will be scheduled for next week.  I will contact the patient with his CTA findings.  Thank you for allowing [place] to participate in this patient's care.  Adele Barthel, MD [facility] Office: [phone] Pager:  [phone]  01/01/2014, 2:58 PM  Addendum  Reviewed CT abd/pelvis on 01/08/14, the patient's EVAR is in good position without an obvious endoleaks.  I suspect the anterior streaks in the sac on 1-2 cuts is artifact as there is no associated posterior contrast flow from a lumbar or anterior contrast flow from a mesenteric collateral.  Aneurysm sac has shrunk already.  There is no evidence of limb dysfunction.  - Based on the CT, I would get an EVAR duplex in 6 months.  Adele Barthel, MD [facility] Office: [phone] Pager: [phone]  01/11/2014, 7:20 AM

## 2014-01-01 NOTE — Addendum Note (Signed)
Addended by: Mena Goes on: 01/01/2014 04:16 PM   Modules accepted: Orders

## 2014-01-05 ENCOUNTER — Encounter (HOSPITAL_COMMUNITY)
Admission: RE | Admit: 2014-01-05 | Discharge: 2014-01-05 | Disposition: A | Discharge status: Home / Self Care | Payer: Medicare Other | Source: Ambulatory Visit | Attending: Pulmonary Disease | Admitting: Pulmonary Disease

## 2014-01-05 DIAGNOSIS — R0609 Other forms of dyspnea: Secondary | ICD-10-CM | POA: Diagnosis not present

## 2014-01-05 DIAGNOSIS — R0989 Other specified symptoms and signs involving the circulatory and respiratory systems: Secondary | ICD-10-CM | POA: Diagnosis not present

## 2014-01-05 NOTE — Progress Notes (Signed)
Today, Tyler [name] exercised at [employer]. Service time was from 1330 to 1515.  The patient exercised for more than 31 minutes performing aerobic, strengthening, and stretching exercises. Oxygen saturation, heart rate, blood pressure, rate of perceived exertion, and shortness of breath were all monitored before, during, and after exercise. Milon presented with no problems at today's exercise session.   There was no workload change during today's exercise session.  Pre-exercise vitals:   Weight kg: 80.2   Liters of O2: ra   SpO2: 96   HR: 86   BP: 122/72   CBG: na  Exercise vitals:   Highest heartrate:  87   Lowest oxygen saturation: 92   Highest blood pressure: 134/76   Liters of 02: ra  Post-exercise vitals:   SpO2: 99   HR: 79   BP: 122/60   Liters of O2: ra   CBG: na  Dr. Brand Males, Medical Director Dr. Aileen Fass is immediately available during today's Pulmonary Rehab session for Tyler [name] on 01/05/2014 at 1330 class time.

## 2014-01-07 ENCOUNTER — Encounter (HOSPITAL_COMMUNITY)
Admission: RE | Admit: 2014-01-07 | Discharge: 2014-01-07 | Disposition: A | Discharge status: Home / Self Care | Payer: Medicare Other | Source: Ambulatory Visit | Attending: Pulmonary Disease | Admitting: Pulmonary Disease

## 2014-01-07 DIAGNOSIS — R0989 Other specified symptoms and signs involving the circulatory and respiratory systems: Secondary | ICD-10-CM | POA: Diagnosis not present

## 2014-01-07 DIAGNOSIS — R0609 Other forms of dyspnea: Secondary | ICD-10-CM | POA: Diagnosis not present

## 2014-01-07 NOTE — Progress Notes (Signed)
Today, Tyler [name] exercised at [employer]. Service time was from 1330 to 1530.  The patient exercised for more than 31 minutes performing aerobic, strengthening, and stretching exercises. Oxygen saturation, heart rate, blood pressure, rate of perceived exertion, and shortness of breath were all monitored before, during, and after exercise. Tyler [name] presented with no problems at today's exercise session. Tyler [name] also attended an education session on exercise for the pulmonary patient.  There was a workload change during today's exercise session.  Pre-exercise vitals:   Weight kg: 80.8   Liters of O2: ra   SpO2: 98   HR: 87   BP: 124/72   CBG: na  Exercise vitals:   Highest heartrate:  89   Lowest oxygen saturation: 96   Highest blood pressure: 132/80   Liters of 02: ra  Post-exercise vitals:   SpO2: 97   HR: 87   BP: 106/64   Liters of O2: ra   CBG: na  Dr. Brand Males, Medical Director Dr. Carles Collet is immediately available during today's Pulmonary Rehab session for [name] on 01/07/2014 at 1330 class time.

## 2014-01-08 ENCOUNTER — Ambulatory Visit (HOSPITAL_COMMUNITY)
Admission: RE | Admit: 2014-01-08 | Discharge: 2014-01-08 | Disposition: A | Discharge status: Home / Self Care | Payer: Medicare Other | Source: Ambulatory Visit | Attending: Physician Assistant | Admitting: Physician Assistant

## 2014-01-08 DIAGNOSIS — R918 Other nonspecific abnormal finding of lung field: Secondary | ICD-10-CM | POA: Insufficient documentation

## 2014-01-08 DIAGNOSIS — N281 Cyst of kidney, acquired: Secondary | ICD-10-CM | POA: Diagnosis not present

## 2014-01-08 DIAGNOSIS — K4090 Unilateral inguinal hernia, without obstruction or gangrene, not specified as recurrent: Secondary | ICD-10-CM | POA: Insufficient documentation

## 2014-01-08 DIAGNOSIS — C3490 Malignant neoplasm of unspecified part of unspecified bronchus or lung: Secondary | ICD-10-CM

## 2014-01-08 DIAGNOSIS — K229 Disease of esophagus, unspecified: Secondary | ICD-10-CM | POA: Diagnosis not present

## 2014-01-08 DIAGNOSIS — I714 Abdominal aortic aneurysm, without rupture, unspecified: Secondary | ICD-10-CM | POA: Insufficient documentation

## 2014-01-08 MED ORDER — IOHEXOL 300 MG/ML  SOLN
100.0000 mL | Freq: Once | INTRAMUSCULAR | Status: AC | PRN
  Administered 2014-01-08: 100 mL via INTRAVENOUS

## 2014-01-11 ENCOUNTER — Telehealth: Payer: Self-pay

## 2014-01-11 ENCOUNTER — Telehealth: Payer: Self-pay | Admitting: Medical Oncology

## 2014-01-11 IMAGING — CT CT CTA ABD/PEL W/CM AND/OR W/O CM
3 of 10 series · 11 of 36 positions shown, 16 images · IV contrast ([ID] [id number])
Comparison: Preoperative CTA 03/24/2013

CLINICAL DATA: 79-year-old male status post endovascular aortic
repair of abdominal aortic aneurysm on 04/12/2013. He presents for
his 1st post repair follow-up evaluation. Additional history
includes metastatic non-small cell adenocarcinoma of the left upper
lobe status post prior palliative radiotherapy and concurrent
chemotherapy.

EXAM:
CT ANGIOGRAPHY ABDOMEN AND PELVIS WITH CONTRAST AND WITHOUT CONTRAST
TECHNIQUE: Multidetector CT imaging of the abdomen and pelvis was performed
using the standard protocol during bolus administration of
intravenous contrast. Multiplanar reconstructed images including
MIPs were obtained and reviewed to evaluate the vascular anatomy.
CONTRAST:  100mL OMNIPAQUE IOHEXOL 350 MG/ML SOLN

[Series 6: angio 2.5 · axial · 0.74mm/px · z∈[-398,-48]mm · 5 of 210 slices shown, 10 images]
[im 35/210  soft-tissue]
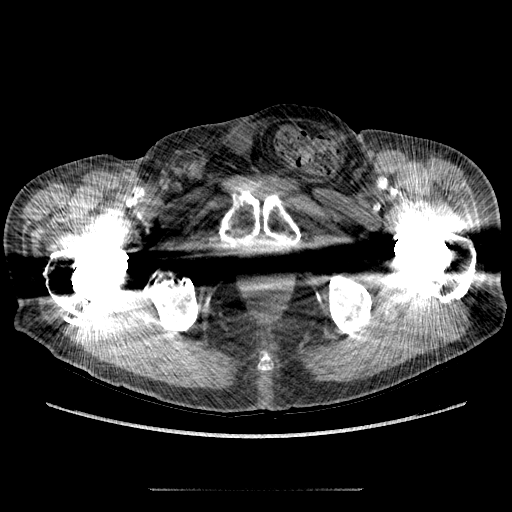
[im 35/210  bone]
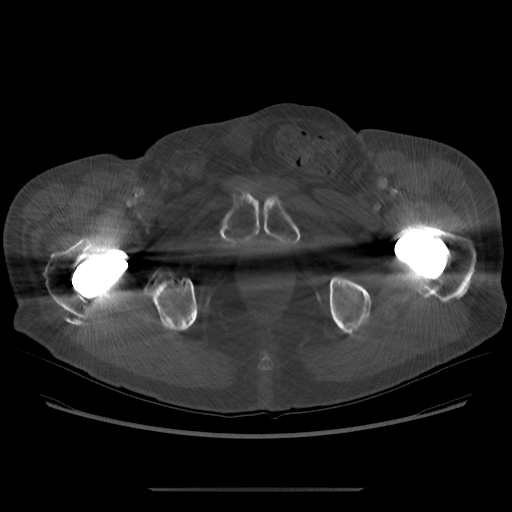
[im 70/210  soft-tissue]
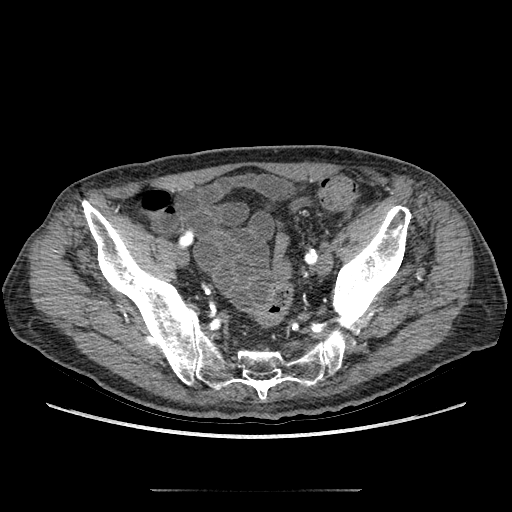
[im 70/210  lung]
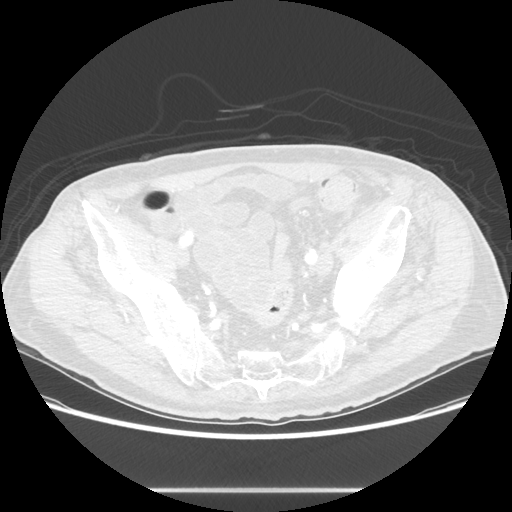
[im 105/210  soft-tissue]
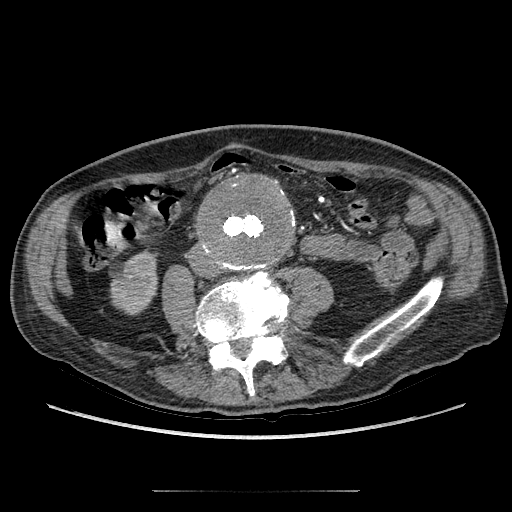
[im 105/210  lung]
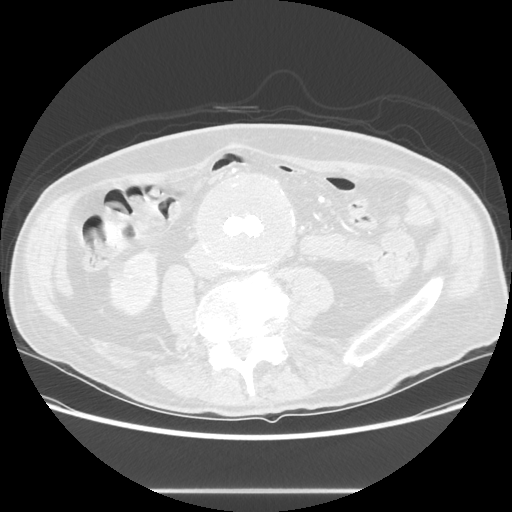
[im 140/210  soft-tissue]
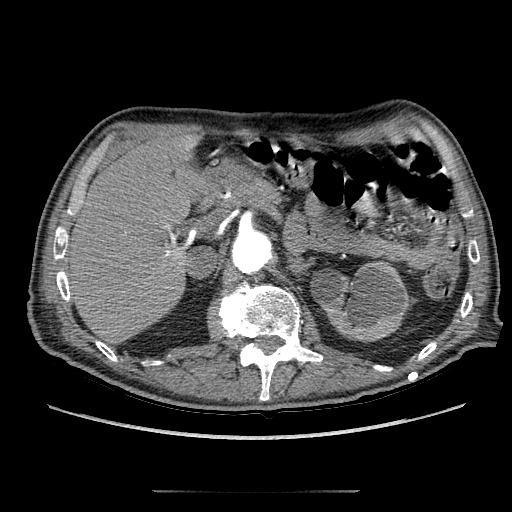
[im 140/210  lung]
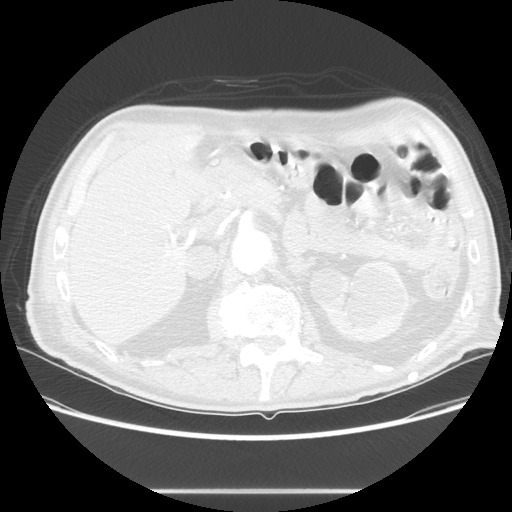
[im 175/210  soft-tissue]
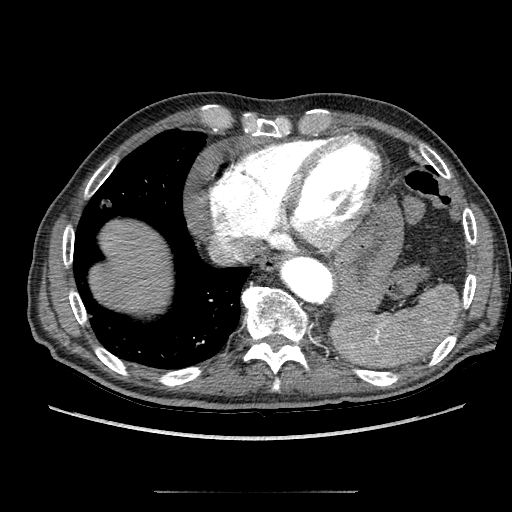
[im 175/210  lung]
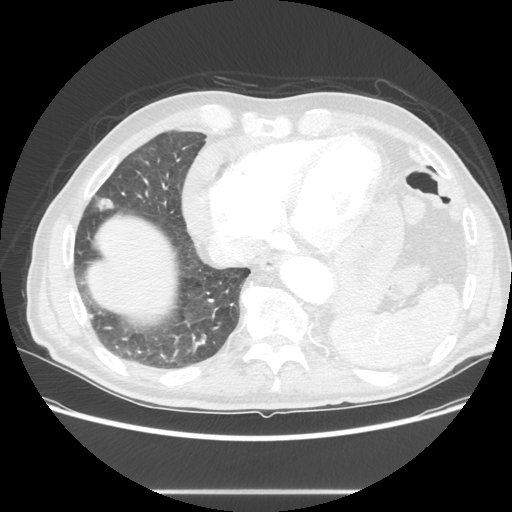

[Series 602: sagittal body · sagittal · 1.02mm/px · 3 of 153 slices shown (1 of 2)]
[im 39/153  soft-tissue]
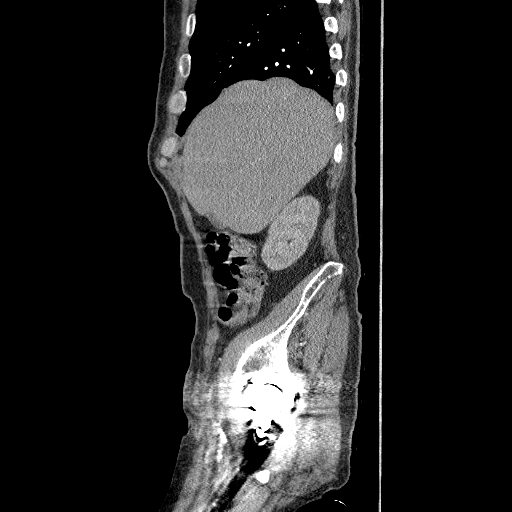
[im 77/153  soft-tissue]
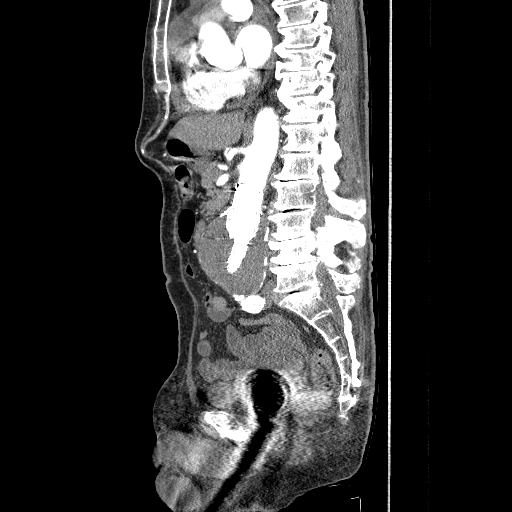
[im 115/153  soft-tissue]
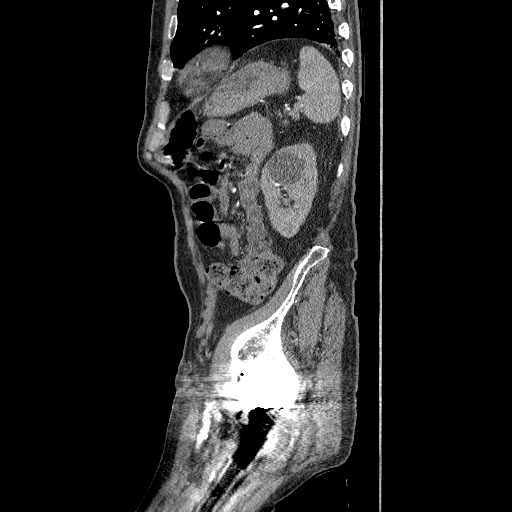

[Series 607: sagittal body · sagittal · 0.74mm/px · 3 of 153 slices shown (2 of 2)]
[im 39/153  soft-tissue]
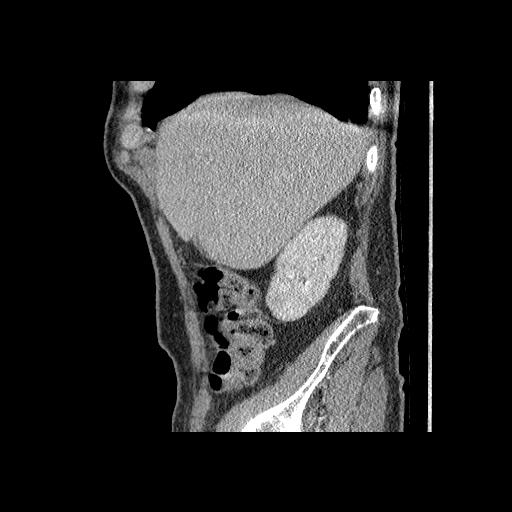
[im 77/153  soft-tissue]
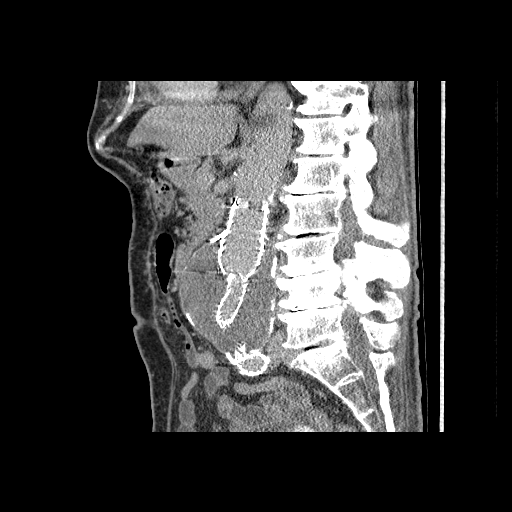
[im 115/153  soft-tissue]
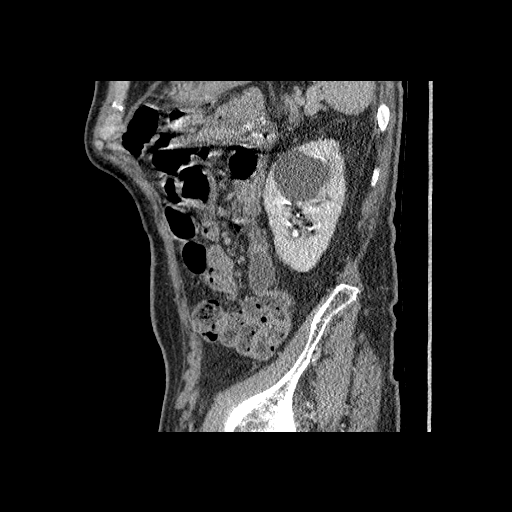

[11 of 36 positions shown; findings below may reference images not displayed]

FINDINGS: VASCULAR

Aorta: Large fusiform infrarenal abdominal aortic aneurysm status
post endovascular repair with what appears to be a bifurcated Tiger
endoprosthesis. The endoprosthesis extends from just below the renal
arteries into the bilateral common iliac arteries just proximal to
the bifurcations. The graft is well-positioned. There is no evidence
of endoleak on the arterial or delayed phase images. No significant
delayed enhancement of the excluded aneurysm sac. Using a
characteristic peripheral calcifications as anatomic markers, the
excluded aneurysm sac measures 7.0 x 7.8 cm today compared to 7.2 x
7.9 cm previously consistent with slight interval improvement.

Celiac: Widely patent.

SMA: Widely patent. The right hepatic artery is replaced to the SMA.

Renals: Single dominant renal arteries bilaterally. The arteries are
patent. Atherosclerotic vascular disease results in at least
moderate narrowing of the origin of the right renal artery.

IMA: Occluded at the origin. The vessel opacifies distally via
retrograde flow.

Inflow: Diffuse atherosclerotic vascular disease without significant
stenosis. The bilateral hypogastric arteries remain patent. No focal
dissection or significant aneurysmal dilatation. The external iliac
arteries are tortuous but relatively spared from disease. Streak
artifact from bilateral hip replacement prostheses limits evaluation
of the common femoral and proximal outflow vessels.

Veins: No focal venous abnormality.

NON-VASCULAR

Lower Chest: Numerous bilateral pulmonary nodules of varying size
and complexity are essentially unchanged over the short interval
since the prior study. There is mild dependent atelectasis. A
background of moderately severe centrilobular pulmonary emphysema.
No pleural effusion. Visualized heart remains within normal limits
for size. There is a moderately large pericardial effusion which is
similar to incrementally larger than seen on the most recent prior
study. Atherosclerotic vascular calcifications including coronary
artery disease. Calcifications noted of the aortic valve.

Abdomen: Unremarkable CT appearance of the stomach, duodenum,
spleen, pancreas and adrenal glands. Normal hepatic contour and
morphology. No discrete hepatic lesion identified. Gallbladder is
unremarkable. No intra or extrahepatic biliary ductal dilatation.

No significant interval change in the appearance of a large simple
left and small simple right renal cysts in the short interval since
the recent prior study. The kidneys demonstrate normal parenchymal
enhancement. No evidence of interval ischemia or infarct.

No evidence for bowel obstruction or other acute abnormality. There
has been interval enlargement of the left inguinal hernia which now
contains a loop of proximal sigmoid colon. Fat containing right
Inguinal hernia remain stable.

Pelvis: Limited evaluation secondary to extensive streak artifact
from bilateral hip replacement prostheses.

Bones/Soft Tissues: Incompletely imaged bilateral hip arthroplasty
prostheses. No evidence of hardware complication involving the
imaged portions. The bones are mildly osteopenic. No acute fracture
or lytic or blastic osseous lesion. Multilevel degenerative disc
disease and lower lumbar facet arthropathy.

Review of the MIP images confirms the above findings.
IMPRESSION: 1. Successful endovascular aortic repair of large fusiform
infrarenal abdominal aortic aneurysm. No evidence of endoleak or
other complication. The excluded aneurysm sac has slightly decreased
in size presently measuring 7.0 x 7.8 cm compared to 7.2 x 7.9 cm on
03/24/2013.
2. Interval enlargement of left inguinal hernia now containing a
loop of nonobstructed sigmoid colon. No CT findings to suggest
incarceration.
3. Numerous additional ancillary findings as detailed above without
significant interval change.

## 2014-01-11 NOTE — Telephone Encounter (Signed)
Message copied by Denman George on Mon Jan 11, 2014 11:53 AM ------      Message from: Conrad LeChee      Created: Mon Jan 11, 2014  7:19 AM       Tyler [name]      [id number]      Jan 04, 1933            Please contact patient to let him know that his aortic aneurysm is shrinking and the repair remains intact. ------

## 2014-01-11 NOTE — Telephone Encounter (Signed)
Requests results of CT. I told him Dr Julien Nordmann will review at appt in two days.

## 2014-01-11 NOTE — Telephone Encounter (Signed)
Attempted to call pt. with results of recent CT ABD/ Pelvis, per Dr. Lianne Moris review.  Left message to call office for results.

## 2014-01-11 NOTE — Telephone Encounter (Signed)
Spoke with the patient.  Advised of information Dr. Bridgett Larsson requested be given to pt., following his CT Abd./ Pelvis, with regards to his Aortic aneurysm.  Verb. Understanding.

## 2014-01-12 ENCOUNTER — Encounter (HOSPITAL_COMMUNITY)
Admission: RE | Admit: 2014-01-12 | Discharge: 2014-01-12 | Disposition: A | Discharge status: Home / Self Care | Payer: Medicare Other | Source: Ambulatory Visit | Attending: Pulmonary Disease | Admitting: Pulmonary Disease

## 2014-01-12 DIAGNOSIS — R0609 Other forms of dyspnea: Secondary | ICD-10-CM | POA: Diagnosis not present

## 2014-01-12 NOTE — Progress Notes (Signed)
I have reviewed a Home Exercise Prescription with [name] . Tyler [name] is not currently exercising at home.  The patient was advised to walk 3 days a week for 15 minutes at [employer].  [name] and I discussed how to progress their exercise prescription.  The patient stated that their goals were reach 80 years of age, increase energy, and strength.  The patient stated that they understand the exercise prescription.  We reviewed exercise guidelines, target heart rate during exercise, oxygen use, weather, home pulse oximeter, endpoints for exercise, and goals.  Patient is encouraged to come to me with any questions. I will continue to follow up with the patient to assist them with progression and safety.

## 2014-01-12 NOTE — Progress Notes (Signed)
Today, Tyler [name] exercised at [employer]. Service time was from 1330 to 1515.  The patient exercised for more than 31 minutes performing aerobic, strengthening, and stretching exercises. Oxygen saturation, heart rate, blood pressure, rate of perceived exertion, and shortness of breath were all monitored before, during, and after exercise. Eion presented with no problems at today's exercise session.   There was no workload change during today's exercise session.  Pre-exercise vitals:   Weight kg: 81.0   Liters of O2: RA   SpO2: 100   HR: 77   BP: 148/82   CBG: NA  Exercise vitals:   Highest heartrate:  88   Lowest oxygen saturation: 95   Highest blood pressure: 140/70   Liters of 02: RA  Post-exercise vitals:   SpO2: 99   HR: 84   BP: 112/64   Liters of O2: RA   CBG: NA Dr. Brand Males, Medical Director Dr. Carles Collet is immediately available during today's Pulmonary Rehab session for Tyler [name] on 01/12/2014 at 1330 class time.

## 2014-01-13 ENCOUNTER — Ambulatory Visit (HOSPITAL_BASED_OUTPATIENT_CLINIC_OR_DEPARTMENT_OTHER): Payer: Medicare Other

## 2014-01-13 ENCOUNTER — Other Ambulatory Visit: Payer: Medicare Other

## 2014-01-13 ENCOUNTER — Ambulatory Visit (HOSPITAL_BASED_OUTPATIENT_CLINIC_OR_DEPARTMENT_OTHER): Payer: Medicare Other | Admitting: Physician Assistant

## 2014-01-13 ENCOUNTER — Encounter: Payer: Self-pay | Admitting: Physician Assistant

## 2014-01-13 ENCOUNTER — Ambulatory Visit: Payer: Medicare Other

## 2014-01-13 ENCOUNTER — Other Ambulatory Visit (HOSPITAL_BASED_OUTPATIENT_CLINIC_OR_DEPARTMENT_OTHER): Payer: Medicare Other

## 2014-01-13 VITALS — BP 137/81 | HR 87 | Temp 97.6°F | Resp 18 | Ht 67.5 in | Wt 181.6 lb

## 2014-01-13 DIAGNOSIS — Z862 Personal history of diseases of the blood and blood-forming organs and certain disorders involving the immune mechanism: Secondary | ICD-10-CM

## 2014-01-13 DIAGNOSIS — C3410 Malignant neoplasm of upper lobe, unspecified bronchus or lung: Secondary | ICD-10-CM

## 2014-01-13 DIAGNOSIS — C3490 Malignant neoplasm of unspecified part of unspecified bronchus or lung: Secondary | ICD-10-CM

## 2014-01-13 DIAGNOSIS — J449 Chronic obstructive pulmonary disease, unspecified: Secondary | ICD-10-CM

## 2014-01-13 DIAGNOSIS — Z5111 Encounter for antineoplastic chemotherapy: Secondary | ICD-10-CM

## 2014-01-13 LAB — CBC WITH DIFFERENTIAL/PLATELET
BASO%: 0.4 % (ref 0.0–2.0)
Basophils Absolute: 0 10*3/uL (ref 0.0–0.1)
EOS%: 0.1 % (ref 0.0–7.0)
Eosinophils Absolute: 0 10*3/uL (ref 0.0–0.5)
HEMATOCRIT: 32.7 % — AB (ref 38.4–49.9)
HGB: 10.9 g/dL — ABNORMAL LOW (ref 13.0–17.1)
LYMPH#: 0.9 10*3/uL (ref 0.9–3.3)
LYMPH%: 10 % — ABNORMAL LOW (ref 14.0–49.0)
MCH: 31.5 pg (ref 27.2–33.4)
MCHC: 33.3 g/dL (ref 32.0–36.0)
MCV: 94.5 fL (ref 79.3–98.0)
MONO#: 0.9 10*3/uL (ref 0.1–0.9)
MONO%: 10 % (ref 0.0–14.0)
NEUT#: 7.1 10*3/uL — ABNORMAL HIGH (ref 1.5–6.5)
NEUT%: 79.5 % — ABNORMAL HIGH (ref 39.0–75.0)
Platelets: 266 10*3/uL (ref 140–400)
RBC: 3.46 10*6/uL — AB (ref 4.20–5.82)
RDW: 21 % — AB (ref 11.0–14.6)
WBC: 8.9 10*3/uL (ref 4.0–10.3)

## 2014-01-13 LAB — COMPREHENSIVE METABOLIC PANEL (CC13)
ALT: 28 U/L (ref 0–55)
AST: 22 U/L (ref 5–34)
Albumin: 3.4 g/dL — ABNORMAL LOW (ref 3.5–5.0)
Alkaline Phosphatase: 58 U/L (ref 40–150)
Anion Gap: 9 mEq/L (ref 3–11)
BUN: 26.1 mg/dL — ABNORMAL HIGH (ref 7.0–26.0)
CO2: 24 meq/L (ref 22–29)
CREATININE: 1.5 mg/dL — AB (ref 0.7–1.3)
Calcium: 9.1 mg/dL (ref 8.4–10.4)
Chloride: 100 mEq/L (ref 98–109)
Glucose: 190 mg/dl — ABNORMAL HIGH (ref 70–140)
POTASSIUM: 4.2 meq/L (ref 3.5–5.1)
Sodium: 133 mEq/L — ABNORMAL LOW (ref 136–145)
Total Bilirubin: 0.43 mg/dL (ref 0.20–1.20)
Total Protein: 6.3 g/dL — ABNORMAL LOW (ref 6.4–8.3)

## 2014-01-13 MED ORDER — DEXAMETHASONE SODIUM PHOSPHATE 10 MG/ML IJ SOLN
INTRAMUSCULAR | Status: AC
  Filled 2014-01-13: qty 1

## 2014-01-13 MED ORDER — SODIUM CHLORIDE 0.9 % IV SOLN
Freq: Once | INTRAVENOUS | Status: AC
  Administered 2014-01-13: 12:00:00 via INTRAVENOUS

## 2014-01-13 MED ORDER — ONDANSETRON 8 MG/50ML IVPB (CHCC)
8.0000 mg | Freq: Once | INTRAVENOUS | Status: AC
  Administered 2014-01-13: 8 mg via INTRAVENOUS

## 2014-01-13 MED ORDER — DEXAMETHASONE 4 MG PO TABS: ORAL_TABLET | ORAL | Status: DC

## 2014-01-13 MED ORDER — DEXAMETHASONE SODIUM PHOSPHATE 10 MG/ML IJ SOLN
10.0000 mg | Freq: Once | INTRAMUSCULAR | Status: AC
  Administered 2014-01-13: 10 mg via INTRAVENOUS

## 2014-01-13 MED ORDER — ONDANSETRON 8 MG/NS 50 ML IVPB
INTRAVENOUS | Status: AC
  Filled 2014-01-13: qty 8

## 2014-01-13 MED ORDER — SODIUM CHLORIDE 0.9 % IV SOLN
400.0000 mg/m2 | Freq: Once | INTRAVENOUS | Status: AC
  Administered 2014-01-13: 775 mg via INTRAVENOUS
  Filled 2014-01-13: qty 31

## 2014-01-13 NOTE — Progress Notes (Addendum)
[place] Telephone:([phone]   [phone]  SHARED VISIT PROGRESS NOTE  Tyler Bellows, MD Tyler [name] [address] [place] [address]  DIAGNOSIS AND STAGE: Metastatic non-small cell lung cancer, adenocarcinoma presented with left upper lobe lung mass in addition to extensive supraclavicular, mediastinal adenopathy and bilateral pulmonary nodules diagnosed in July of 2014. EGFR mutation as well as ALK gene translocation are negative.   PRIOR THERAPY:  1) Palliative radiotherapy to the enlarged mediastinal and supraclavicular lymph nodes under the care of Dr. Tammi Klippel.  2) Systemic chemotherapy with carboplatin for AUC of 5 and Alimta 500 mg/M2 giving every 3 weeks. First dose given on 01/21/2013. Status post 6 cycles.   CURRENT THERAPY: Systemic chemotherapy with single agent Alimta 400 mg/m2 every 3 weeks. Status post 3 cycles  CHEMOTHERAPY INTENT: Palliative  CURRENT # OF CHEMOTHERAPY CYCLES: 4 CURRENT ANTIEMETICS: Zofran, dexamethasone and Compazine  CURRENT SMOKING STATUS: currently a nonsmoker  ORAL CHEMOTHERAPY AND CONSENT: None  CURRENT BISPHOSPHONATES USE: None  PAIN MANAGEMENT: No pain  NARCOTICS INDUCED CONSTIPATION: N/A  LIVING WILL AND CODE STATUS: Full code   INTERVAL HISTORY: Tyler [name] 80 y.o. male returns to the clinic today for followup visit accompanied by his girlfriend, Tyler [name] and his daughter. He is tolerating the single agent Alimta chemotherapy relatively well. His main complaints are that of his COPD which is followed by Dr. Azalee Course and his left inguinal hernia pain. He is participating in a pulmonary rehab program. He requests a refill for his dexamethasone.  He does report that the itching that he was experiencing involving his chest and back has resolved with prednisone that was prescribed for his COPD. he does note some occasional recurrences of the itchiness that at times can be quite severe. He  continues to have mild fatigue and shortness of breath with exertion. He denied having any significant chest pain, cough or hemoptysis. He denied having any significant weight loss or night sweats. He has no fever or chills, no nausea or vomiting. He is here today to start cycle #4 of his chemotherapy. He recently had a restaging CT scan of the chest, abdomen and pelvis and also presents to discuss the results.  MEDICAL HISTORY: Past Medical History  Diagnosis Date  . BENIGN PROSTATIC HYPERTROPHY 07/23/2008  . 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 HIP PAIN, LEFT 07/04/2007  . HYPERLIPIDEMIA 01/02/2007  . HYPERTENSION 01/02/2007  . INSOMNIA 11/16/2008  . PROSTATE SPECIFIC ANTIGEN, ELEVATED 09/10/2008  . AAA (abdominal aortic aneurysm)   . Dysrhythmia     afib  . Shortness of breath   . COPD (chronic obstructive pulmonary disease)   . GERD (gastroesophageal reflux disease)   . Anemia   . History of blood transfusion   . Lung cancer dx'd 11/2012    metastatic non small cell lung cancer, adenocarcinoma     ALLERGIES:  is allergic to vicodin.  MEDICATIONS:  Current Outpatient Prescriptions  Medication Sig Dispense Refill  . cholecalciferol (VITAMIN D) 1000 UNITS tablet Take 1,000 Units by mouth every morning.       . famciclovir (FAMVIR) 500 MG tablet       . fluocinonide cream (LIDEX) 0.05 %       . folic acid (FOLVITE) 1 MG tablet Take 1 tablet (1 mg total) by mouth daily.  30 tablet  2  . multivitamin-iron-minerals-folic acid (CENTRUM) chewable tablet Chew 1 tablet by mouth every morning.       Marland Kitchen  propranolol (INDERAL) 10 MG tablet Take 1 tablet by mouth 2 (two) times daily.      Marland Kitchen acetaminophen (TYLENOL) 325 MG tablet Take 650 mg by mouth every 6 (six) hours as needed.      Marland Kitchen azithromycin (ZITHROMAX) 250 MG tablet       . dexamethasone (DECADRON) 4 MG tablet Take 1 tablet by mouth twice daily the day before, the day of, and the day after chemotherapy. Take with food.  30 tablet  1  .  finasteride (PROSCAR) 5 MG tablet Take 5 mg by mouth daily.      . fluconazole (DIFLUCAN) 100 MG tablet       . fluconazole (DIFLUCAN) 200 MG tablet       . Ipratropium-Albuterol (COMBIVENT RESPIMAT) 20-100 MCG/ACT AERS respimat Inhale 1 puff into the lungs every 6 (six) hours.  4 g  6  . predniSONE (DELTASONE) 20 MG tablet       . tamsulosin (FLOMAX) 0.4 MG CAPS capsule Take 0.4 mg by mouth daily.       No current facility-administered medications for this visit.    SURGICAL HISTORY:  Past Surgical History  Procedure Laterality Date  . Transurethral resection of prostate  2004  . Total hip arthroplasty  2010, 2011    2010- left; 2011- right  . Upper gastrointestinal endoscopy    . Abdominal aortic endovascular stent graft N/A 04/14/2013    Procedure: ABDOMINAL AORTIC ENDOVASCULAR STENT GRAFT;  Surgeon: Conrad , MD;  Location: [facility] OR;  Service: Vascular;  Laterality: N/A;    REVIEW OF SYSTEMS:  Constitutional: positive for anorexia and fatigue Eyes: negative Ears, nose, mouth, throat, and face: positive for hoarseness.  Respiratory: positive for cough and dyspnea on exertion Cardiovascular: negative Gastrointestinal: negative Genitourinary: Positive for left inguinal hernia discomfort Integument/breast: negative Hematologic/lymphatic: negative Musculoskeletal: negative. Neurological: negative Behavioral/Psych: positive for anxiety, depression and fatigue Endocrine: negative Allergic/Immunologic: negative   PHYSICAL EXAMINATION: General appearance: alert, cooperative, fatigued and no distress Head: Normocephalic, without obvious abnormality, atraumatic Neck: no adenopathy, no JVD, supple, symmetrical, trachea midline and thyroid not enlarged, symmetric, no tenderness/mass/nodules Lymph nodes: Cervical, supraclavicular, and axillary nodes normal. Resp: clear to auscultation bilaterally Back: symmetric, no curvature. ROM normal. No CVA tenderness. Cardio: regular rate and  rhythm, S1, S2 normal, no murmur, click, rub or gallop GI: soft, non-tender; bowel sounds normal; no masses,  no organomegaly Extremities: extremities normal, atraumatic, no cyanosis or edema Neurologic: Alert and oriented X 3, normal strength and tone. Normal symmetric reflexes. Normal coordination and gait   ECOG PERFORMANCE STATUS: 1 - Symptomatic but completely ambulatory  Blood pressure 137/81, pulse 87, temperature 97.6 F (36.4 C), temperature source Oral, resp. rate 18, height 5' 7.5" (1.715 m), weight 181 lb 9.6 oz (82.373 kg).  LABORATORY DATA: Lab Results  Component Value Date   WBC 8.9 01/13/2014   HGB 10.9* 01/13/2014   HCT 32.7* 01/13/2014   MCV 94.5 01/13/2014   PLT 266 01/13/2014      Chemistry      Component Value Date/Time   NA 133* 01/13/2014 1034   NA 131* 04/15/2013 0500   K 4.2 01/13/2014 1034   K 4.1 04/15/2013 0500   CL 96 04/15/2013 0500   CO2 24 01/13/2014 1034   CO2 22 04/15/2013 0500   BUN 26.1* 01/13/2014 1034   BUN 17 04/15/2013 0500   CREATININE 1.5* 01/13/2014 1034   CREATININE 1.27 04/15/2013 0500      Component Value Date/Time  CALCIUM 9.1 01/13/2014 1034   CALCIUM 8.4 04/15/2013 0500   ALKPHOS 58 01/13/2014 1034   ALKPHOS 42 04/15/2013 0500   AST 22 01/13/2014 1034   AST 15 04/15/2013 0500   ALT 28 01/13/2014 1034   ALT 9 04/15/2013 0500   BILITOT 0.43 01/13/2014 1034   BILITOT 0.5 04/15/2013 0500       RADIOGRAPHIC STUDIES: Ct Chest W Contrast  01/08/2014   CLINICAL DATA:  Non-small cell carcinoma of the  lung.  EXAM: CT CHEST, ABDOMEN, AND PELVIS WITH CONTRAST  TECHNIQUE: Multidetector CT imaging of the chest, abdomen and pelvis was performed following the standard protocol during bolus administration of intravenous contrast.  CONTRAST:  170m OMNIPAQUE IOHEXOL 300 MG/ML  SOLN  COMPARISON:  CT 10/07/2013  FINDINGS: CT CHEST FINDINGS  No axillary or supraclavicular lymphadenopathy. Enlarged right lower paratracheal lymph node measuring 17 mm  short axis compared to 16 mm on prior. Small lymph nodes in the prevascular space are in the 8 mm in short axis compared to 8 mm on prior. Subcarinal lymph nodes measures 14 mm compared to 15 mm on prior. No pericardial fluid. There is mild thickening esophagus throughout its course.  Review of the lung parenchyma again demonstrates multiple bilateral round pulmonary nodules of varying size consistent with pulmonary metastasis. Some individual nodules have increased in size.  For example right lower lobe nodule measuring 18 mm (image 41, series 5) increased from 13 mm on prior.  Two adjacent nodules in the right lower lobe measures 18 and 11 mm (image 34) compared to 17 mm and 8 mm.  Nodule in the lingula measures 8 mm compared to 8 mm on prior.  Left lower lobe nodule measures 14 mm compared to 10 on prior (image 28, series 5).  CT ABDOMEN AND PELVIS FINDINGS  No focal hepatic lesion. The gallbladder, pancreas, spleen, adrenal glands, kidneys are unchanged. There are low-density cysts of the left and right kidneys.  Large left inguinal hernia contains a loop of nonobstructed sigmoid colon.  Abdominal aorta is aneurysmal with a graft repair. No evidence of endoleak. No retroperitoneal periportal lymphadenopathy.  No free fluid the pelvis.  There is bilateral hip prosthetics.  IMPRESSION: Chest Impression:  1. While the overall pattern of the pulmonary metastasis is similar, several individual lesions have increased in size. 2. Stable mediastinal lymphadenopathy. Abdomen / Pelvis Impression  1. No evidence of metastatic disease the abdomen pelvis. 2. Stable bilateral low-density renal lesions 3. Large left inguinal hernia contains a loop of not strangulated large bowel.   Electronically Signed   By: SSuzy BouchardM.D.   On: 01/08/2014 15:36   Ct Abdomen Pelvis W Contrast  01/08/2014   CLINICAL DATA:  Non-small cell carcinoma of the  lung.  EXAM: CT CHEST, ABDOMEN, AND PELVIS WITH CONTRAST  TECHNIQUE:  Multidetector CT imaging of the chest, abdomen and pelvis was performed following the standard protocol during bolus administration of intravenous contrast.  CONTRAST:  1022mOMNIPAQUE IOHEXOL 300 MG/ML  SOLN  COMPARISON:  CT 10/07/2013  FINDINGS: CT CHEST FINDINGS  No axillary or supraclavicular lymphadenopathy. Enlarged right lower paratracheal lymph node measuring 17 mm short axis compared to 16 mm on prior. Small lymph nodes in the prevascular space are in the 8 mm in short axis compared to 8 mm on prior. Subcarinal lymph nodes measures 14 mm compared to 15 mm on prior. No pericardial fluid. There is mild thickening esophagus throughout its course.  Review of the lung parenchyma again  demonstrates multiple bilateral round pulmonary nodules of varying size consistent with pulmonary metastasis. Some individual nodules have increased in size.  For example right lower lobe nodule measuring 18 mm (image 41, series 5) increased from 13 mm on prior.  Two adjacent nodules in the right lower lobe measures 18 and 11 mm (image 34) compared to 17 mm and 8 mm.  Nodule in the lingula measures 8 mm compared to 8 mm on prior.  Left lower lobe nodule measures 14 mm compared to 10 on prior (image 28, series 5).  CT ABDOMEN AND PELVIS FINDINGS  No focal hepatic lesion. The gallbladder, pancreas, spleen, adrenal glands, kidneys are unchanged. There are low-density cysts of the left and right kidneys.  Large left inguinal hernia contains a loop of nonobstructed sigmoid colon.  Abdominal aorta is aneurysmal with a graft repair. No evidence of endoleak. No retroperitoneal periportal lymphadenopathy.  No free fluid the pelvis.  There is bilateral hip prosthetics.  IMPRESSION: Chest Impression:  1. While the overall pattern of the pulmonary metastasis is similar, several individual lesions have increased in size. 2. Stable mediastinal lymphadenopathy. Abdomen / Pelvis Impression  1. No evidence of metastatic disease the abdomen pelvis.  2. Stable bilateral low-density renal lesions 3. Large left inguinal hernia contains a loop of not strangulated large bowel.   Electronically Signed   By: Suzy Bouchard M.D.   On: 01/08/2014 15:36   ASSESSMENT AND PLAN: This is a very pleasant 80 years old white male with metastatic non-small cell lung cancer, adenocarcinoma with negative EGFR mutation and negative ALK gene translocation currently on systemic chemotherapy with carboplatin and Alimta status post 6 cycles.  He is currently undergoing systemic chemotherapy with reduced dose single agent Alimta 400 mg/M2 every 3 weeks, status post 3 cycles. Patient was discussed with him seen by Dr. Julien Nordmann. His restaging CT scan revealed slight increase in some of the pulmonary nodules with no evidence of metastatic disease in the abdomen or pelvis. These results were discussed with the patient and his family members. Dr. Julien Nordmann offered the option of adding back carboplatin to the Alimta versus continuing on Alimta alone for the next 3 cycles. The patient opted to continue with single agent Alimta for an additional 3 cycles of chemotherapy. Should his next restaging CT scan revealed further increase further discussions to discuss further treatment options will take place.  He will proceed with cycle #4 single agent Alimta at 400 mg per meter squared given every 3 weeks today as scheduled. He'll return in 3 weeks for another symptom management visit.  The patient was advised to call immediately if he has any concerning symptoms in the interval.  Carlton Adam, PA-C  Disclaimer: This note was dictated with voice recognition software. Similar sounding words can inadvertently be transcribed and may not be corrected upon review. Carlton Adam, PA-C 01/13/2014  ADDENDUM: Hematology/Oncology Attending: I had a face to face encounter with the patient. I recommended his care plan. This is a very pleasant 81 years old white male with metastatic  non-small cell lung cancer, adenocarcinoma status post induction chemotherapy with carboplatin and Alimta and he is currently undergoing systemic chemotherapy with single agent Alimta status post 3 cycles. He is tolerating his treatment well except for mild fatigue. His recent CT scan showed mild disease progression with increase in size of multiple pulmonary nodules. I discussed the scan results with the patient and his family and given the option of continuous observation with single agent Alimta  and close monitoring of the pulmonary nodules versus adding carboplatin to Alimta versus switching to a different chemotherapy regimen he is with single agent docetaxel immunotherapy. After discussion of her the option the patient prefers to continue on a single agent Alimta at this point. He will proceed with cycle #4 today as scheduled. The patient would come back for followup visit in 3 weeks with the next cycle of his treatment. He was advised to call immediately if he has any concerning symptoms in the interval.  Disclaimer: This note was dictated with voice recognition software. Similar sounding words can inadvertently be transcribed and may not be corrected upon review. Eilleen Kempf., MD 01/18/2014

## 2014-01-14 ENCOUNTER — Encounter (HOSPITAL_COMMUNITY)
Admission: RE | Admit: 2014-01-14 | Discharge: 2014-01-14 | Disposition: A | Discharge status: Home / Self Care | Payer: Medicare Other | Source: Ambulatory Visit | Attending: Pulmonary Disease | Admitting: Pulmonary Disease

## 2014-01-14 DIAGNOSIS — R0609 Other forms of dyspnea: Secondary | ICD-10-CM | POA: Diagnosis not present

## 2014-01-14 DIAGNOSIS — R0989 Other specified symptoms and signs involving the circulatory and respiratory systems: Secondary | ICD-10-CM | POA: Diagnosis not present

## 2014-01-14 NOTE — Progress Notes (Signed)
[name] 80 y.o. male Nutrition Note Spoke with pt and pt's significant other, Natalie. Pt is overweight. Wt reviewed in EMR. Pt wt is down 15.5 lb over the past 13 months. Wt loss reportedly due to chemo a year ago. Current chemo regimen has not decreased pt appetite according to pt. Pt has regained approximately 13 lbs over the past 7 months. Pt c/o some foods not tasting good, but pt has not had difficulty finding foods he likes to eat. Pt is making healthy food choices the majority of the time. Pt's Rate Your Plate results reviewed with pt. Pt does not avoid salty food; uses canned/ convenience food and eats out frequently.  Pt adds salt to food. Age-appropriate diet discussed. Pt c/o constipation. Pt managing constipation by consuming 6, 16.9 oz bottles of water daily, increasing fiber intake, and taking stool softeners. Pt's significant other, [name], helps with grocery shopping/cooking. Pt expressed understanding of the information reviewed.   Vitals - 1 value per visit 01/13/2014 01/01/2014 12/23/2013 12/22/2013 12/02/2013  Weight (lb) 181.6 179.4 184  179.6   Vitals - 1 value per visit 11/12/2013 11/10/2013 10/28/2013 10/08/2013  Weight (lb)  173 173.1 170.8   Vitals - 1 value per visit 07/09/2013 06/19/2013 05/28/2013 05/22/2013  Weight (lb) 165.4 168.9 164.8 167   Vitals - 1 value per visit 05/07/2013 05/01/2013 04/22/2013 04/15/2013  Weight (lb)  161.9 163.5    Vitals - 1 value per visit 04/14/2013 04/13/2013 04/09/2013 04/03/2013  Weight (lb) 181.22 164.4 164 161   Vitals - 1 value per visit 03/27/2013 03/26/2013 03/17/2013 03/16/2013  Weight (lb) 162.8 162.7 165.4    Vitals - 1 value per visit 03/05/2013 03/03/2013 02/26/2013 02/24/2013 02/12/2013  Weight (lb) 166.5  167.7 169 171.9   Vitals - 1 value per visit 02/02/2013 01/25/2013 01/23/2013 01/22/2013 01/20/2013  Weight (lb) 177 196 183.6  183.1   Vitals - 1 value per visit 01/19/2013 01/16/2013 01/13/2013 01/09/2013 01/05/2013  Weight (lb) 186.2 184.9  186.2 190.2 188   Vitals - 1 value per visit 12/31/2012 12/25/2012 12/23/2012 12/17/2012 12/08/2012  Weight (lb) 188.6  188.9 190 193   Nutrition Diagnosis   Food-and nutrition-related knowledge deficit related to lack of exposure to information as related to diagnosis of pulmonary disease Nutrition Rx/Est. Daily Nutrition Needs for: ? wt maintenance 2100-2400 Kcal  100-115 gm protein   1500 mg or less sodium      Nutrition Intervention   Pt's individual nutrition plan and goals reviewed with pt.   Benefits of adopting healthy eating habits discussed when pt's Rate Your Plate reviewed.   Pt to attend the Nutrition and Lung Disease class   Continual client-centered nutrition education by RD, as part of interdisciplinary care. Goal(s) 1. Describe the benefit of including fruits, vegetables, whole grains, and low-fat dairy products in a healthy meal plan. Monitor and Evaluate progress toward nutrition goal with team.   Derek Mound, M.Ed, RD, LDN, CDE 01/14/2014 2:46 PM

## 2014-01-14 NOTE — Progress Notes (Signed)
Today, Tyler [name] exercised at [employer]. Tyler [name] Tyler [name]. Service time was from 1330 to 1515.  The patient exercised for more than 31 minutes performing aerobic, strengthening, and stretching exercises. Oxygen saturation, heart rate, blood pressure, rate of perceived exertion, and shortness of breath were all monitored before, during, and after exercise. Tyler [name] presented with no problems at today's exercise session. Tyler [name] also attended an education session on warning signs of illnesses in the Tyler patient.  There was no workload change during today's exercise session.  Pre-exercise vitals:   Weight kg: 81.9   Liters of O2: ra   SpO2: 97   HR: 79   BP: 130/62   CBG: na  Exercise vitals:   Highest heartrate:  88   Lowest oxygen saturation: 94   Highest blood pressure: 120/70   Liters of 02: ra  Post-exercise vitals:   SpO2: 98   HR: 80   BP: 120/66   Liters of O2: ra   CBG: na  Dr. Brand Males, Medical Director Dr. Coralyn Pear is immediately available during today's Tyler [name] session for Tyler [name] on 01/14/2014 at 1330 class time.

## 2014-01-15 ENCOUNTER — Telehealth: Payer: Self-pay | Admitting: Internal Medicine

## 2014-01-15 NOTE — Patient Instructions (Signed)
Your CT scan revealed small increase in centimeter pulmonary nodules however there is no evidence of metastatic disease within your abdomen or pelvis Continue with labs and chemotherapy as scheduled Followup in 3 weeks

## 2014-01-15 NOTE — Telephone Encounter (Signed)
s.w. pt and advised on Aug appt....pt ok and aware...sed added tx.

## 2014-01-19 ENCOUNTER — Other Ambulatory Visit: Payer: Self-pay | Admitting: Internal Medicine

## 2014-01-19 ENCOUNTER — Telehealth: Payer: Self-pay | Admitting: *Deleted

## 2014-01-19 ENCOUNTER — Encounter (HOSPITAL_COMMUNITY)
Admission: RE | Admit: 2014-01-19 | Discharge: 2014-01-19 | Disposition: A | Discharge status: Home / Self Care | Payer: Medicare Other | Source: Ambulatory Visit | Attending: Pulmonary Disease | Admitting: Pulmonary Disease

## 2014-01-19 DIAGNOSIS — I319 Disease of pericardium, unspecified: Secondary | ICD-10-CM | POA: Insufficient documentation

## 2014-01-19 DIAGNOSIS — R0609 Other forms of dyspnea: Secondary | ICD-10-CM | POA: Insufficient documentation

## 2014-01-19 DIAGNOSIS — C3490 Malignant neoplasm of unspecified part of unspecified bronchus or lung: Secondary | ICD-10-CM

## 2014-01-19 DIAGNOSIS — R05 Cough: Secondary | ICD-10-CM | POA: Insufficient documentation

## 2014-01-19 DIAGNOSIS — R0989 Other specified symptoms and signs involving the circulatory and respiratory systems: Principal | ICD-10-CM | POA: Insufficient documentation

## 2014-01-19 DIAGNOSIS — J4489 Other specified chronic obstructive pulmonary disease: Secondary | ICD-10-CM | POA: Insufficient documentation

## 2014-01-19 DIAGNOSIS — J449 Chronic obstructive pulmonary disease, unspecified: Secondary | ICD-10-CM | POA: Diagnosis not present

## 2014-01-19 DIAGNOSIS — R059 Cough, unspecified: Secondary | ICD-10-CM | POA: Diagnosis not present

## 2014-01-19 NOTE — Telephone Encounter (Signed)
Received phone call from patient stating that [facility] does not have his prescription for Dexamethasone.  Informed patient that prescription was sent to [facility] on [address]. Also, informed patient that I could have prescription transferred to [facility] on [address].  Patient stated that he would just go to [facility] and pick it up.

## 2014-01-19 NOTE — Progress Notes (Signed)
Today, [name] exercised at [employer]. Service time was from 1330 to 1515.  The patient exercised for more than 31 minutes performing aerobic, strengthening, and stretching exercises. Oxygen saturation, heart rate, blood pressure, rate of perceived exertion, and shortness of breath were all monitored before, during, and after exercise. [name] presented with no problems at today's exercise session.   There was a workload change during today's exercise session.  Pre-exercise vitals:   Weight kg: 80.7   Liters of O2: ra   SpO2: 96   HR: 87   BP: 122/82   CBG: na  Exercise vitals:   Highest heartrate:  92   Lowest oxygen saturation: 95   Highest blood pressure: 120/70   Liters of 02: ra  Post-exercise vitals:   SpO2: 99   HR: 84   BP: 112/60   Liters of O2: ra   CBG: na  Dr. Brand Males, Medical Director Dr. Coralyn Pear is immediately available during today's Pulmonary Rehab session for Tyler [name] on 01/19/2014 at 1330 class time.

## 2014-01-21 ENCOUNTER — Encounter (HOSPITAL_COMMUNITY)
Admission: RE | Admit: 2014-01-21 | Discharge: 2014-01-21 | Disposition: A | Discharge status: Home / Self Care | Payer: Medicare Other | Source: Ambulatory Visit | Attending: Pulmonary Disease | Admitting: Pulmonary Disease

## 2014-01-21 DIAGNOSIS — R0609 Other forms of dyspnea: Secondary | ICD-10-CM | POA: Diagnosis not present

## 2014-01-21 DIAGNOSIS — R0989 Other specified symptoms and signs involving the circulatory and respiratory systems: Secondary | ICD-10-CM | POA: Diagnosis not present

## 2014-01-21 NOTE — Progress Notes (Signed)
Today, Tyri exercised at [employer]. Service time was from 1330 to 1515.  The patient exercised for more than 31 minutes performing aerobic, strengthening, and stretching exercises. Oxygen saturation, heart rate, blood pressure, rate of perceived exertion, and shortness of breath were all monitored before, during, and after exercise. Bryen presented with no problems at today's exercise session. Jaime also attended an education session on Advanced directives.  There was no workload change during today's exercise session.  Pre-exercise vitals:   Weight kg: 80.3   Liters of O2: ra   SpO2: 95   HR: 86   BP: 142/84   CBG: na  Exercise vitals:   Highest heartrate:  87   Lowest oxygen saturation: 96   Highest blood pressure: 126/88   Liters of 02: ra  Post-exercise vitals:   SpO2: 99   HR: 83   BP: 108/72   Liters of O2: ra   CBG: na  Dr. Brand Males, Medical Director Dr. Frederic Jericho is immediately available during today's Pulmonary Rehab session for [name] on 01/21/2014 at 1330 class time.

## 2014-01-26 ENCOUNTER — Encounter (HOSPITAL_COMMUNITY)
Admission: RE | Admit: 2014-01-26 | Discharge: 2014-01-26 | Disposition: A | Discharge status: Home / Self Care | Payer: Medicare Other | Source: Ambulatory Visit | Attending: Pulmonary Disease | Admitting: Pulmonary Disease

## 2014-01-26 DIAGNOSIS — R0609 Other forms of dyspnea: Secondary | ICD-10-CM | POA: Diagnosis not present

## 2014-01-26 NOTE — Progress Notes (Signed)
Today, [name] exercised at [employer] [facility]. Service time was from 1330 to 1515.  The patient exercised for more than 31 minutes performing aerobic, strengthening, and stretching exercises. Oxygen saturation, heart rate, blood pressure, rate of perceived exertion, and shortness of breath were all monitored before, during, and after exercise. Tyler [name] presented with no problems at today's exercise session.   There was no workload change during today's exercise session.  Pre-exercise vitals:   Weight kg: 79.0   Liters of O2: ra   SpO2: 97   HR: 86   BP: 122/66   CBG: na  Exercise vitals:   Highest heartrate:  86   Lowest oxygen saturation: 97   Highest blood pressure: 122/74   Liters of 02: ra  Post-exercise vitals:   SpO2: 100   HR: 80   BP: 118/72   Liters of O2: ra   CBG: na  Dr. Brand Males, Medical Director Dr. Frederic Jericho is immediately available during today's Pulmonary Rehab session for [name] on 01/26/2014 at 1330 class time.

## 2014-01-28 ENCOUNTER — Encounter (HOSPITAL_COMMUNITY)
Admission: RE | Admit: 2014-01-28 | Discharge: 2014-01-28 | Disposition: A | Discharge status: Home / Self Care | Payer: Medicare Other | Source: Ambulatory Visit | Attending: Pulmonary Disease | Admitting: Pulmonary Disease

## 2014-01-28 DIAGNOSIS — R0609 Other forms of dyspnea: Secondary | ICD-10-CM | POA: Diagnosis not present

## 2014-01-28 NOTE — Progress Notes (Signed)
Today, Tyler [name] exercised at [facility]. Service time was from 1330 to 1530.  The patient exercised for more than 31 minutes performing aerobic, strengthening, and stretching exercises. Oxygen saturation, heart rate, blood pressure, rate of perceived exertion, and shortness of breath were all monitored before, during, and after exercise. Tyler [name] presented with no problems at today's exercise session. Patient attended pulmonary medication class.  There was no workload change during today's exercise session.  Pre-exercise vitals:   Weight kg: 82.7   Liters of O2: RA   SpO2: 95   HR: 92   BP: 128/80   CBG: NA  Exercise vitals:   Highest heartrate:  90   Lowest oxygen saturation: 95   Highest blood pressure: 124/72   Liters of 02: RA  Post-exercise vitals:   SpO2: 98   HR: 85   BP: 126/64   Liters of O2: RA   CBG: NA Tyler [name], Medical Director Tyler [name] is immediately available during today's Pulmonary Rehab session for Tyler [name] on 01/28/2014 at 1330 class time.

## 2014-02-02 ENCOUNTER — Encounter (HOSPITAL_COMMUNITY)
Admission: RE | Admit: 2014-02-02 | Discharge: 2014-02-02 | Disposition: A | Discharge status: Home / Self Care | Payer: Medicare Other | Source: Ambulatory Visit | Attending: Pulmonary Disease | Admitting: Pulmonary Disease

## 2014-02-02 DIAGNOSIS — R0609 Other forms of dyspnea: Secondary | ICD-10-CM | POA: Diagnosis not present

## 2014-02-02 NOTE — Progress Notes (Signed)
Today, Sadler exercised at [facility]. Service time was from 1330 to 1500.  The patient exercised for more than 31 minutes performing aerobic, strengthening, and stretching exercises. Oxygen saturation, heart rate, blood pressure, rate of perceived exertion, and shortness of breath were all monitored before, during, and after exercise. Terry presented with no problems at today's exercise session.   There was  workload change during today's exercise session.  Pre-exercise vitals:   Weight kg: 83.0   Liters of O2: ra   SpO2: 99   HR: 84   BP: 158/86   CBG: na  Exercise vitals:   Highest heartrate:  88   Lowest oxygen saturation: 89 increased to 95 with rest break and PLB   Highest blood pressure: 122/80   Liters of 02: ra  Post-exercise vitals:   SpO2: 100   HR: 81   BP: 122/72   Liters of O2: ra   CBG: na Dr. Brand Males, Medical Director Dr. Maryland Pink is immediately available during today's Pulmonary Rehab session for [name] on Testicular self exam taught. at 1330 class time.

## 2014-02-03 ENCOUNTER — Encounter: Payer: Self-pay | Admitting: Physician Assistant

## 2014-02-03 ENCOUNTER — Ambulatory Visit (HOSPITAL_BASED_OUTPATIENT_CLINIC_OR_DEPARTMENT_OTHER): Payer: Medicare Other | Admitting: Physician Assistant

## 2014-02-03 ENCOUNTER — Encounter: Payer: Self-pay | Admitting: Internal Medicine

## 2014-02-03 ENCOUNTER — Other Ambulatory Visit: Payer: Medicare Other

## 2014-02-03 ENCOUNTER — Ambulatory Visit (HOSPITAL_BASED_OUTPATIENT_CLINIC_OR_DEPARTMENT_OTHER): Payer: Medicare Other

## 2014-02-03 ENCOUNTER — Other Ambulatory Visit (HOSPITAL_BASED_OUTPATIENT_CLINIC_OR_DEPARTMENT_OTHER): Payer: Medicare Other

## 2014-02-03 ENCOUNTER — Telehealth: Payer: Self-pay | Admitting: Internal Medicine

## 2014-02-03 VITALS — BP 154/95 | HR 84 | Temp 97.6°F | Resp 19 | Ht 67.5 in | Wt 184.0 lb

## 2014-02-03 DIAGNOSIS — C3410 Malignant neoplasm of upper lobe, unspecified bronchus or lung: Secondary | ICD-10-CM

## 2014-02-03 DIAGNOSIS — Z5111 Encounter for antineoplastic chemotherapy: Secondary | ICD-10-CM

## 2014-02-03 DIAGNOSIS — C3490 Malignant neoplasm of unspecified part of unspecified bronchus or lung: Secondary | ICD-10-CM

## 2014-02-03 LAB — COMPREHENSIVE METABOLIC PANEL (CC13)
ALK PHOS: 56 U/L (ref 40–150)
ALT: 29 U/L (ref 0–55)
AST: 22 U/L (ref 5–34)
Albumin: 3.6 g/dL (ref 3.5–5.0)
Anion Gap: 11 mEq/L (ref 3–11)
BUN: 29.7 mg/dL — ABNORMAL HIGH (ref 7.0–26.0)
CHLORIDE: 100 meq/L (ref 98–109)
CO2: 23 mEq/L (ref 22–29)
CREATININE: 1.6 mg/dL — AB (ref 0.7–1.3)
Calcium: 9 mg/dL (ref 8.4–10.4)
Glucose: 258 mg/dl — ABNORMAL HIGH (ref 70–140)
Potassium: 4.7 mEq/L (ref 3.5–5.1)
Sodium: 134 mEq/L — ABNORMAL LOW (ref 136–145)
Total Bilirubin: 0.4 mg/dL (ref 0.20–1.20)
Total Protein: 6.4 g/dL (ref 6.4–8.3)

## 2014-02-03 LAB — CBC WITH DIFFERENTIAL/PLATELET
BASO%: 0.1 % (ref 0.0–2.0)
Basophils Absolute: 0 10*3/uL (ref 0.0–0.1)
EOS%: 0.1 % (ref 0.0–7.0)
Eosinophils Absolute: 0 10*3/uL (ref 0.0–0.5)
HCT: 34.3 % — ABNORMAL LOW (ref 38.4–49.9)
HGB: 11.5 g/dL — ABNORMAL LOW (ref 13.0–17.1)
LYMPH%: 9.5 % — AB (ref 14.0–49.0)
MCH: 32.6 pg (ref 27.2–33.4)
MCHC: 33.5 g/dL (ref 32.0–36.0)
MCV: 97.2 fL (ref 79.3–98.0)
MONO#: 0.2 10*3/uL (ref 0.1–0.9)
MONO%: 2.8 % (ref 0.0–14.0)
NEUT#: 5.9 10*3/uL (ref 1.5–6.5)
NEUT%: 87.5 % — ABNORMAL HIGH (ref 39.0–75.0)
NRBC: 0 % (ref 0–0)
Platelets: 184 10*3/uL (ref 140–400)
RBC: 3.53 10*6/uL — AB (ref 4.20–5.82)
RDW: 21.2 % — ABNORMAL HIGH (ref 11.0–14.6)
WBC: 6.7 10*3/uL (ref 4.0–10.3)
lymph#: 0.6 10*3/uL — ABNORMAL LOW (ref 0.9–3.3)

## 2014-02-03 MED ORDER — ONDANSETRON 8 MG/50ML IVPB (CHCC)
8.0000 mg | Freq: Once | INTRAVENOUS | Status: AC
  Administered 2014-02-03: 8 mg via INTRAVENOUS

## 2014-02-03 MED ORDER — DEXAMETHASONE SODIUM PHOSPHATE 10 MG/ML IJ SOLN
10.0000 mg | Freq: Once | INTRAMUSCULAR | Status: AC
  Administered 2014-02-03: 10 mg via INTRAVENOUS

## 2014-02-03 MED ORDER — PEMETREXED DISODIUM CHEMO INJECTION 500 MG
400.0000 mg/m2 | Freq: Once | INTRAVENOUS | Status: AC
  Administered 2014-02-03: 775 mg via INTRAVENOUS
  Filled 2014-02-03: qty 31

## 2014-02-03 MED ORDER — SODIUM CHLORIDE 0.9 % IV SOLN
Freq: Once | INTRAVENOUS | Status: AC
  Administered 2014-02-03: 12:00:00 via INTRAVENOUS

## 2014-02-03 MED ORDER — ONDANSETRON 8 MG/NS 50 ML IVPB
INTRAVENOUS | Status: AC
  Filled 2014-02-03: qty 8

## 2014-02-03 MED ORDER — DEXAMETHASONE SODIUM PHOSPHATE 10 MG/ML IJ SOLN
INTRAMUSCULAR | Status: AC
  Filled 2014-02-03: qty 1

## 2014-02-03 NOTE — Progress Notes (Signed)
Okay to treat today, per Dr. Julien Nordmann.

## 2014-02-03 NOTE — Patient Instructions (Signed)
[place] Discharge Instructions for Patients Receiving Chemotherapy  Today you received the following chemotherapy agents Alimta  To help prevent nausea and vomiting after your treatment, we encourage you to take your nausea medication    If you develop nausea and vomiting that is not controlled by your nausea medication, call the clinic.   BELOW ARE SYMPTOMS THAT SHOULD BE REPORTED IMMEDIATELY:  *FEVER GREATER THAN 100.5 F  *CHILLS WITH OR WITHOUT FEVER  NAUSEA AND VOMITING THAT IS NOT CONTROLLED WITH YOUR NAUSEA MEDICATION  *UNUSUAL SHORTNESS OF BREATH  *UNUSUAL BRUISING OR BLEEDING  TENDERNESS IN MOUTH AND THROAT WITH OR WITHOUT PRESENCE OF ULCERS  *URINARY PROBLEMS  *BOWEL PROBLEMS  UNUSUAL RASH Items with * indicate a potential emergency and should be followed up as soon as possible.  Feel free to call the clinic you have any questions or concerns. The clinic phone number is [phone].

## 2014-02-03 NOTE — Progress Notes (Addendum)
[place] [phone]   [phone]  SHARED VISIT PROGRESS NOTE  [name], MD [address] [address]  DIAGNOSIS AND STAGE: Metastatic non-small cell lung cancer, adenocarcinoma presented with left upper lobe lung mass in addition to extensive supraclavicular, mediastinal adenopathy and bilateral pulmonary nodules diagnosed in July of 2014. EGFR mutation as well as ALK gene translocation are negative.   PRIOR THERAPY:  1) Palliative radiotherapy to the enlarged mediastinal and supraclavicular lymph nodes under the care of Dr. Tammi Klippel.  2) Systemic chemotherapy with carboplatin for AUC of 5 and Alimta 500 mg/M2 giving every 3 weeks. First dose given on 01/21/2013. Status post 6 cycles.   CURRENT THERAPY: Systemic chemotherapy with single agent Alimta 400 mg/m2 every 3 weeks. Status post 4 cycles  CHEMOTHERAPY INTENT: Palliative  CURRENT # OF CHEMOTHERAPY CYCLES: 5 CURRENT ANTIEMETICS: Zofran, dexamethasone and Compazine  CURRENT SMOKING STATUS: currently a nonsmoker  ORAL CHEMOTHERAPY AND CONSENT: None  CURRENT BISPHOSPHONATES USE: None  PAIN MANAGEMENT: No pain  NARCOTICS INDUCED CONSTIPATION: N/A  LIVING WILL AND CODE STATUS: Full code   INTERVAL HISTORY: Tyler [name] 80 y.o. male returns to the clinic today for followup visit accompanied by his girlfriend, Tyler [name].  He is tolerating the single agent Alimta chemotherapy relatively well. His main complaints are that of his COPD which is followed by Dr. Azalee Course and dizziness with position change. He also complains of left shoulder pain with decreased range of motion of the past few days. This most painful and is reaching backwards or when he is lying down on the children. He also continues to have generalized achiness primarily involving his back and scalp. He is participating in a pulmonary rehab program.  He continues to have mild fatigue and  shortness of breath with exertion. He denied having any significant chest pain, cough or hemoptysis. He denied having any significant weight loss or night sweats. He has no fever or chills, no nausea or vomiting. He is here today to start cycle #4 of his chemotherapy. He has questions as to whether his treatment is working or not. He wants to enjoy some reasonable quality of life. He reports increased fatigue. He will be attending a family vacation 02/24/2014 through 03/03/2014. He would like to postpone the chemotherapy due around that time by 1 week so that he can enjoy the vacation.   MEDICAL HISTORY: Past Medical History  Diagnosis Date  . BENIGN PROSTATIC HYPERTROPHY 07/23/2008  . 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 HIP PAIN, LEFT 07/04/2007  . HYPERLIPIDEMIA 01/02/2007  . HYPERTENSION 01/02/2007  . INSOMNIA 11/16/2008  . PROSTATE SPECIFIC ANTIGEN, ELEVATED 09/10/2008  . AAA (abdominal aortic aneurysm)   . Dysrhythmia     afib  . Shortness of breath   . COPD (chronic obstructive pulmonary disease)   . GERD (gastroesophageal reflux disease)   . Anemia   . History of blood transfusion   . Lung cancer dx'd 11/2012    metastatic non small cell lung cancer, adenocarcinoma     ALLERGIES:  has no active allergies.  MEDICATIONS:  Current Outpatient Prescriptions  Medication Sig Dispense Refill  . acetaminophen (TYLENOL) 325 MG tablet Take 650 mg by mouth every 6 (six) hours as needed.      . cholecalciferol (VITAMIN D) 1000 UNITS tablet Take 1,000 Units by mouth every morning.       Marland Kitchen dexamethasone (DECADRON) 4 MG tablet TAKE 1  TAB BY MOUTH TWICE DAILY THE DAY BEFORE, THE DAY OF, & THE DAY AFTER CHEMOTHERAPY EVERY 3 WKS  60 tablet  0  . fluocinonide cream (LIDEX) 0.05 %       . folic acid (FOLVITE) 1 MG tablet Take 1 tablet (1 mg total) by mouth daily.  30 tablet  2  . multivitamin-iron-minerals-folic acid (CENTRUM) chewable tablet Chew 1 tablet by mouth every morning.       .  propranolol (INDERAL) 10 MG tablet Take 1 tablet by mouth 2 (two) times daily.      . finasteride (PROSCAR) 5 MG tablet Take 5 mg by mouth daily.      . Ipratropium-Albuterol (COMBIVENT RESPIMAT) 20-100 MCG/ACT AERS respimat Inhale 1 puff into the lungs every 6 (six) hours.  4 g  6  . tamsulosin (FLOMAX) 0.4 MG CAPS capsule Take 0.4 mg by mouth daily.       No current facility-administered medications for this visit.    SURGICAL HISTORY:  Past Surgical History  Procedure Laterality Date  . Transurethral resection of prostate  2004  . Total hip arthroplasty  2010, 2011    2010- left; 2011- right  . Upper gastrointestinal endoscopy    . Abdominal aortic endovascular stent graft N/A 04/14/2013    Procedure: ABDOMINAL AORTIC ENDOVASCULAR STENT GRAFT;  Surgeon: Conrad Whitney, MD;  Location: [facility];  Service: Vascular;  Laterality: N/A;    REVIEW OF SYSTEMS:  Constitutional: positive for anorexia and fatigue Eyes: negative Ears, nose, mouth, throat, and face: positive for hoarseness.  Respiratory: positive for cough and dyspnea on exertion Cardiovascular: negative Gastrointestinal: negative Genitourinary: Positive for left inguinal hernia discomfort Integument/breast: negative Hematologic/lymphatic: negative Musculoskeletal: negative. Neurological: negative Behavioral/Psych: positive for anxiety, depression and fatigue Endocrine: negative Allergic/Immunologic: negative   PHYSICAL EXAMINATION: General appearance: alert, cooperative, fatigued and no distress Head: Normocephalic, without obvious abnormality, atraumatic Neck: no adenopathy, no JVD, supple, symmetrical, trachea midline and thyroid not enlarged, symmetric, no tenderness/mass/nodules Lymph nodes: Cervical, supraclavicular, and axillary nodes normal. Resp: clear to auscultation bilaterally Back: symmetric, no curvature. ROM normal. No CVA tenderness. Cardio: regular rate and rhythm, S1, S2 normal, no murmur, click, rub or  gallop GI: soft, non-tender; bowel sounds normal; no masses,  no organomegaly Extremities: extremities normal, atraumatic, no cyanosis or edema Neurologic: Alert and oriented X 3, normal strength and tone. Normal symmetric reflexes. Normal coordination and gait   ECOG PERFORMANCE STATUS: 1 - Symptomatic but completely ambulatory  Blood pressure 154/95, pulse 84, temperature 97.6 F (36.4 C), resp. rate 19, height 5' 7.5" (1.715 m), weight 184 lb (83.462 kg), SpO2 99.00%.  LABORATORY DATA: Lab Results  Component Value Date   WBC 6.7 02/03/2014   HGB 11.5* 02/03/2014   HCT 34.3* 02/03/2014   MCV 97.2 02/03/2014   PLT 184 02/03/2014      Chemistry      Component Value Date/Time   NA 134* 02/03/2014 1007   NA 131* 04/15/2013 0500   K 4.7 02/03/2014 1007   K 4.1 04/15/2013 0500   CL 96 04/15/2013 0500   CO2 23 02/03/2014 1007   CO2 22 04/15/2013 0500   BUN 29.7* 02/03/2014 1007   BUN 17 04/15/2013 0500   CREATININE 1.6* 02/03/2014 1007   CREATININE 1.27 04/15/2013 0500      Component Value Date/Time   CALCIUM 9.0 02/03/2014 1007   CALCIUM 8.4 04/15/2013 0500   ALKPHOS 56 02/03/2014 1007   ALKPHOS 42 04/15/2013 0500   AST 22  02/03/2014 1007   AST 15 04/15/2013 0500   ALT 29 02/03/2014 1007   ALT 9 04/15/2013 0500   BILITOT 0.40 02/03/2014 1007   BILITOT 0.5 04/15/2013 0500       RADIOGRAPHIC STUDIES: Ct Chest W Contrast  01/08/2014   CLINICAL DATA:  Non-small cell carcinoma of the  lung.  EXAM: CT CHEST, ABDOMEN, AND PELVIS WITH CONTRAST  TECHNIQUE: Multidetector CT imaging of the chest, abdomen and pelvis was performed following the standard protocol during bolus administration of intravenous contrast.  CONTRAST:  168m OMNIPAQUE IOHEXOL 300 MG/ML  SOLN  COMPARISON:  CT 10/07/2013  FINDINGS: CT CHEST FINDINGS  No axillary or supraclavicular lymphadenopathy. Enlarged right lower paratracheal lymph node measuring 17 mm short axis compared to 16 mm on prior. Small lymph nodes in the  prevascular space are in the 8 mm in short axis compared to 8 mm on prior. Subcarinal lymph nodes measures 14 mm compared to 15 mm on prior. No pericardial fluid. There is mild thickening esophagus throughout its course.  Review of the lung parenchyma again demonstrates multiple bilateral round pulmonary nodules of varying size consistent with pulmonary metastasis. Some individual nodules have increased in size.  For example right lower lobe nodule measuring 18 mm (image 41, series 5) increased from 13 mm on prior.  Two adjacent nodules in the right lower lobe measures 18 and 11 mm (image 34) compared to 17 mm and 8 mm.  Nodule in the lingula measures 8 mm compared to 8 mm on prior.  Left lower lobe nodule measures 14 mm compared to 10 on prior (image 28, series 5).  CT ABDOMEN AND PELVIS FINDINGS  No focal hepatic lesion. The gallbladder, pancreas, spleen, adrenal glands, kidneys are unchanged. There are low-density cysts of the left and right kidneys.  Large left inguinal hernia contains a loop of nonobstructed sigmoid colon.  Abdominal aorta is aneurysmal with a graft repair. No evidence of endoleak. No retroperitoneal periportal lymphadenopathy.  No free fluid the pelvis.  There is bilateral hip prosthetics.  IMPRESSION: Chest Impression:  1. While the overall pattern of the pulmonary metastasis is similar, several individual lesions have increased in size. 2. Stable mediastinal lymphadenopathy. Abdomen / Pelvis Impression  1. No evidence of metastatic disease the abdomen pelvis. 2. Stable bilateral low-density renal lesions 3. Large left inguinal hernia contains a loop of not strangulated large bowel.   Electronically Signed   By: SSuzy BouchardM.D.   On: 01/08/2014 15:36   Ct Abdomen Pelvis W Contrast  01/08/2014   CLINICAL DATA:  Non-small cell carcinoma of the  lung.  EXAM: CT CHEST, ABDOMEN, AND PELVIS WITH CONTRAST  TECHNIQUE: Multidetector CT imaging of the chest, abdomen and pelvis was performed  following the standard protocol during bolus administration of intravenous contrast.  CONTRAST:  104mOMNIPAQUE IOHEXOL 300 MG/ML  SOLN  COMPARISON:  CT 10/07/2013  FINDINGS: CT CHEST FINDINGS  No axillary or supraclavicular lymphadenopathy. Enlarged right lower paratracheal lymph node measuring 17 mm short axis compared to 16 mm on prior. Small lymph nodes in the prevascular space are in the 8 mm in short axis compared to 8 mm on prior. Subcarinal lymph nodes measures 14 mm compared to 15 mm on prior. No pericardial fluid. There is mild thickening esophagus throughout its course.  Review of the lung parenchyma again demonstrates multiple bilateral round pulmonary nodules of varying size consistent with pulmonary metastasis. Some individual nodules have increased in size.  For example right lower lobe  nodule measuring 18 mm (image 41, series 5) increased from 13 mm on prior.  Two adjacent nodules in the right lower lobe measures 18 and 11 mm (image 34) compared to 17 mm and 8 mm.  Nodule in the lingula measures 8 mm compared to 8 mm on prior.  Left lower lobe nodule measures 14 mm compared to 10 on prior (image 28, series 5).  CT ABDOMEN AND PELVIS FINDINGS  No focal hepatic lesion. The gallbladder, pancreas, spleen, adrenal glands, kidneys are unchanged. There are low-density cysts of the left and right kidneys.  Large left inguinal hernia contains a loop of nonobstructed sigmoid colon.  Abdominal aorta is aneurysmal with a graft repair. No evidence of endoleak. No retroperitoneal periportal lymphadenopathy.  No free fluid the pelvis.  There is bilateral hip prosthetics.  IMPRESSION: Chest Impression:  1. While the overall pattern of the pulmonary metastasis is similar, several individual lesions have increased in size. 2. Stable mediastinal lymphadenopathy. Abdomen / Pelvis Impression  1. No evidence of metastatic disease the abdomen pelvis. 2. Stable bilateral low-density renal lesions 3. Large left inguinal  hernia contains a loop of not strangulated large bowel.   Electronically Signed   By: Suzy Bouchard M.D.   On: 01/08/2014 15:36   ASSESSMENT AND PLAN: This is a very pleasant 80 years old white male with metastatic non-small cell lung cancer, adenocarcinoma with negative EGFR mutation and negative ALK gene translocation currently on systemic chemotherapy with carboplatin and Alimta status post 6 cycles.  He is currently undergoing systemic chemotherapy with reduced dose single agent Alimta 400 mg/M2 every 3 weeks, status post 4 cycles. Patient was discussed with him seen by Dr. Julien Nordmann. His most recent restaging CT scan revealed slight increase in some of the pulmonary nodules with no evidence of metastatic disease in the abdomen or pelvis. Mr. Quintella Baton that he is willing to complete cycle 5 of cycle 6 of single agent Alimta followed by restaging CT scan. Pending the results of the CT scan he would definitely be amenable to the chemotherapy break. He'll proceed with cycle #5 today as scheduled. He will return in 3 weeks prior to cycle #6 another symptom management visit. Regarding his dizziness with position change, he is advised to change positions slowly. We will adjust his chemotherapy schedule to accommodate his family vacation.  The patient was advised to call immediately if he has any concerning symptoms in the interval.  Disclaimer: This note was dictated with voice recognition software. Similar sounding words can inadvertently be transcribed and may not be corrected upon review. Carlton Adam, PA-C 02/03/2014  ADDENDUM: Hematology/Oncology Attending: I had a face to face encounter with the patient. I recommended his care plan. This is a very pleasant 80 years old white male with metastatic non-small cell lung cancer, adenocarcinoma currently undergoing treatment with single agent Alimta status post 5 cycles and tolerating his treatment fairly well. He continues to complain of pain on the  left shoulder area and with decreased range of motion. He also has fatigue and occasional shortness of breath especially with exertion but he still able to exercise at regular basis.  Will proceed with cycle #6 today as scheduled. The patient would come back for followup visit in 3 weeks after repeating CT scan of the chest, abdomen and pelvis for restaging of his disease. He was advised to call immediately if he has any concerning symptoms in the interval.  Disclaimer: This note was dictated with voice recognition software. Similar sounding  words can inadvertently be transcribed and may be missed upon review. Eilleen Kempf., MD 02/06/2014

## 2014-02-03 NOTE — Telephone Encounter (Signed)
gv and printed appt scehd and avs for pt for SEpt...sed added tx.

## 2014-02-04 ENCOUNTER — Encounter (HOSPITAL_COMMUNITY)
Admission: RE | Admit: 2014-02-04 | Discharge: 2014-02-04 | Disposition: A | Discharge status: Home / Self Care | Payer: Medicare Other | Source: Ambulatory Visit | Attending: Pulmonary Disease | Admitting: Pulmonary Disease

## 2014-02-04 NOTE — Progress Notes (Signed)
Today, Toy arrived at [facility] [facility]. Service time was from 1330 to 1430.  The patient did not exercise today due to incontinence just as the exercise session started.    He soiled his clothing and left to go home to clean up.  He did attend the home exercise class, warm up stretches and strength training for 10 minutes.      Pre-exercise vitals:   Weight kg: 84.2   Liters of O2: RA   SpO2: 96   HR: 83   BP: 140/80   CBG: NA  Dr. Brand Males, Medical Director Dr. Lavella Lemons is immediately available during today's Pulmonary Rehab session for [name] on 02/04/2014 at 1330 class time.

## 2014-02-05 NOTE — Patient Instructions (Signed)
Follow-up in 3 weeks

## 2014-02-08 ENCOUNTER — Telehealth: Payer: Self-pay | Admitting: Pulmonary Disease

## 2014-02-08 ENCOUNTER — Ambulatory Visit (INDEPENDENT_AMBULATORY_CARE_PROVIDER_SITE_OTHER)
Admission: RE | Admit: 2014-02-08 | Discharge: 2014-02-08 | Disposition: A | Discharge status: Home / Self Care | Payer: Medicare Other | Source: Ambulatory Visit | Attending: Emergency Medicine | Admitting: Emergency Medicine

## 2014-02-08 ENCOUNTER — Encounter: Payer: Self-pay | Admitting: Emergency Medicine

## 2014-02-08 ENCOUNTER — Ambulatory Visit (INDEPENDENT_AMBULATORY_CARE_PROVIDER_SITE_OTHER): Payer: Medicare Other | Admitting: Emergency Medicine

## 2014-02-08 VITALS — BP 138/80 | HR 94 | Temp 98.6°F | Ht 68.0 in | Wt 182.0 lb

## 2014-02-08 DIAGNOSIS — J449 Chronic obstructive pulmonary disease, unspecified: Secondary | ICD-10-CM

## 2014-02-08 MED ORDER — AZITHROMYCIN 250 MG PO TABS: ORAL_TABLET | ORAL | Status: DC

## 2014-02-08 NOTE — Telephone Encounter (Signed)
Called and spoke with natalie---pts spouse and she stated that the pt has been c/o chest discomfort and cough for about 1.5 weeks.  Sputum is clear.  Just not feeling well.  Requested an appt today since he has a pending appt with BQ on next Monday.  appt scheduled today with RB at 3:45.  appt with MW has been cancelled for tomorrow.

## 2014-02-08 NOTE — Patient Instructions (Addendum)
We will perform a CXR today Please start using your combivent 4 times a day on a schedule Take azithromycin for 5 days as directed.  Start using guaifenesin 600mg  twice a day for the next week.  Follow with Dr Lake Bells in 2 weeks. Call us sooner if you have any problems.

## 2014-02-08 NOTE — Progress Notes (Signed)
HPI :  80 yo man followed by Dr Lake Bells for moderate COPD, hx of stage 4 adenoCA of the LUL. Most recent CT scan 01/08/14 with some increase in size of pulm nodules, no abdominal disease evident. He has received chemo/XRT in past, now receiving a 2nd course (has completed 5 of 6 with plans to reassess).   He presents today c/o progressive dyspnea over about a week. He has been coughing more for 2 days, particularly at night. Productive of pale brownish phlegm. His chest feels tight even at rest. He is on combivent prn, but rarely uses it. He hasn't tried it in the last few days - he is skeptical that it helps him. Exertional tolerance is down. No fevers. He has had sweats for a week, chills for a week. He has been taking his decadron every 2-3 days (not as its prescribed).    Past Medical History  Diagnosis Date  . BENIGN PROSTATIC HYPERTROPHY 07/23/2008  . 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 HIP PAIN, LEFT 07/04/2007  . HYPERLIPIDEMIA 01/02/2007  . HYPERTENSION 01/02/2007  . INSOMNIA 11/16/2008  . PROSTATE SPECIFIC ANTIGEN, ELEVATED 09/10/2008  . AAA (abdominal aortic aneurysm)   . Dysrhythmia     afib  . Shortness of breath   . COPD (chronic obstructive pulmonary disease)   . GERD (gastroesophageal reflux disease)   . Anemia   . History of blood transfusion   . Lung cancer dx'd 11/2012    metastatic non small cell lung cancer, adenocarcinoma      Family History  Problem Relation Age of Onset  . Colon polyps Neg Hx   . Stomach cancer Neg Hx   . Colon cancer Neg Hx   . Cancer Neg Hx      History   Social History  . Marital Status: Widowed    Spouse Name: N/A    Number of Children: N/A  . Years of Education: N/A   Occupational History  . Retired Software engineer    Social History Main Topics  . Smoking status: Former Smoker -- 1.00 packs/day for 40 years    Types: Cigarettes    Quit date: 06/18/1998  . Smokeless tobacco: Never Used  . Alcohol Use: No  . Drug Use: No  .  Sexual Activity: No   Other Topics Concern  . Not on file   Social History Narrative  . No narrative on file     No Active Allergies   Outpatient Prescriptions Prior to Visit  Medication Sig Dispense Refill  . acetaminophen (TYLENOL) 325 MG tablet Take 650 mg by mouth every 6 (six) hours as needed.      . cholecalciferol (VITAMIN D) 1000 UNITS tablet Take 1,000 Units by mouth every morning.       Marland Kitchen dexamethasone (DECADRON) 4 MG tablet TAKE 1 TAB BY MOUTH TWICE DAILY THE DAY BEFORE, THE DAY OF, & THE DAY AFTER CHEMOTHERAPY EVERY 3 WKS  60 tablet  0  . fluocinonide cream (LIDEX) 0.05 %       . folic acid (FOLVITE) 1 MG tablet Take 1 tablet (1 mg total) by mouth daily.  30 tablet  2  . Ipratropium-Albuterol (COMBIVENT RESPIMAT) 20-100 MCG/ACT AERS respimat Inhale 1 puff into the lungs every 6 (six) hours.  4 g  6  . multivitamin-iron-minerals-folic acid (CENTRUM) chewable tablet Chew 1 tablet by mouth every morning.       . propranolol (INDERAL) 10 MG tablet Take 1 tablet by mouth 2 (two) times daily.      Marland Kitchen  finasteride (PROSCAR) 5 MG tablet Take 5 mg by mouth daily.      . tamsulosin (FLOMAX) 0.4 MG CAPS capsule Take 0.4 mg by mouth daily.       No facility-administered medications prior to visit.   Filed Vitals:   02/08/14 1612  BP: 138/80  Pulse: 94  Temp: 98.6 F (37 C)  TempSrc: Oral  Height: 5\' 8"  (1.727 m)  Weight: 182 lb (82.555 kg)  SpO2: 97%   Gen: Pleasant, elder man, in no distress,  normal affect  ENT: No lesions,  mouth clear,  oropharynx clear, no postnasal drip  Neck: No JVD, no TMG, no carotid bruits  Lungs: No use of accessory muscles, distant, clear without rales or rhonchi  Cardiovascular: RRR, heart sounds normal, no murmur or gallops, no peripheral edema  Musculoskeletal: No deformities, no cyanosis or clubbing  Neuro: alert, non focal  Skin: Warm, no lesions or rashes    CT scan chest/abd/pelvis 01/08/14 ---  COMPARISON: CT 10/07/2013   FINDINGS:  CT CHEST FINDINGS  No axillary or supraclavicular lymphadenopathy. Enlarged right lower  paratracheal lymph node measuring 17 mm short axis compared to 16 mm  on prior. Small lymph nodes in the prevascular space are in the 8 mm  in short axis compared to 8 mm on prior. Subcarinal lymph nodes  measures 14 mm compared to 15 mm on prior. No pericardial fluid.  There is mild thickening esophagus throughout its course.  Review of the lung parenchyma again demonstrates multiple bilateral  round pulmonary nodules of varying size consistent with pulmonary  metastasis. Some individual nodules have increased in size.  For example right lower lobe nodule measuring 18 mm (image 41,  series 5) increased from 13 mm on prior.  Two adjacent nodules in the right lower lobe measures 18 and 11 mm  (image 34) compared to 17 mm and 8 mm.  Nodule in the lingula measures 8 mm compared to 8 mm on prior.  Left lower lobe nodule measures 14 mm compared to 10 on prior (image  28, series 5).  CT ABDOMEN AND PELVIS FINDINGS  No focal hepatic lesion. The gallbladder, pancreas, spleen, adrenal  glands, kidneys are unchanged. There are low-density cysts of the  left and right kidneys.  Large left inguinal hernia contains a loop of nonobstructed sigmoid  colon.  Abdominal aorta is aneurysmal with a graft repair. No evidence of  endoleak. No retroperitoneal periportal lymphadenopathy.  No free fluid the pelvis. There is bilateral hip prosthetics.  IMPRESSION:  Chest Impression:  1. While the overall pattern of the pulmonary metastasis is similar,  several individual lesions have increased in size.  2. Stable mediastinal lymphadenopathy.  Abdomen / Pelvis Impression  1. No evidence of metastatic disease the abdomen pelvis.  2. Stable bilateral low-density renal lesions  3. Large left inguinal hernia contains a loop of not strangulated  large bowel.    COPD, moderate Not clearly an AE - no wheeze  on exam. But he does likely have a bronchitis vs early opportunistic PNA.  - will treat with azithro, check CXR and change to quinolone if he has an infiltrate. - guaifenesin  - asked him to start using his combivent on a schedule so he can report to Dr Lake Bells whether this has been helpful. If so, they may choose to put him on long=acting meds

## 2014-02-08 NOTE — Assessment & Plan Note (Signed)
Not clearly an AE - no wheeze on exam. But he does likely have a bronchitis vs early opportunistic PNA.  - will treat with azithro, check CXR and change to quinolone if he has an infiltrate. - guaifenesin  - asked him to start using his combivent on a schedule so he can report to Dr Lake Bells whether this has been helpful. If so, they may choose to put him on long=acting meds

## 2014-02-09 ENCOUNTER — Ambulatory Visit: Payer: Medicare Other | Admitting: Internal Medicine

## 2014-02-09 ENCOUNTER — Encounter (HOSPITAL_COMMUNITY)
Admission: RE | Admit: 2014-02-09 | Discharge: 2014-02-09 | Disposition: A | Discharge status: Home / Self Care | Payer: Medicare Other | Source: Ambulatory Visit | Attending: Pulmonary Disease | Admitting: Pulmonary Disease

## 2014-02-09 DIAGNOSIS — R0609 Other forms of dyspnea: Secondary | ICD-10-CM | POA: Diagnosis not present

## 2014-02-09 DIAGNOSIS — R0989 Other specified symptoms and signs involving the circulatory and respiratory systems: Secondary | ICD-10-CM | POA: Diagnosis not present

## 2014-02-11 ENCOUNTER — Encounter (HOSPITAL_COMMUNITY)
Admission: RE | Admit: 2014-02-11 | Discharge: 2014-02-11 | Disposition: A | Discharge status: Home / Self Care | Payer: Medicare Other | Source: Ambulatory Visit | Attending: Pulmonary Disease | Admitting: Pulmonary Disease

## 2014-02-11 DIAGNOSIS — R0989 Other specified symptoms and signs involving the circulatory and respiratory systems: Secondary | ICD-10-CM | POA: Diagnosis not present

## 2014-02-11 DIAGNOSIS — R0609 Other forms of dyspnea: Secondary | ICD-10-CM | POA: Diagnosis not present

## 2014-02-11 NOTE — Progress Notes (Signed)
Today, Jafet exercised at [facility]. Service time was from 1230 to 1430.  The patient exercised for more than 31 minutes performing aerobic, strengthening, and stretching exercises. Oxygen saturation, heart rate, blood pressure, rate of perceived exertion, and shortness of breath were all monitored before, during, and after exercise. Earland presented with no problems at today's exercise session. Alexandros also attended a Q and A session with the pulmonologist.  There was no workload change during today's exercise session.  Pre-exercise vitals:   Weight kg: 82.0   Liters of O2: ra   SpO2: 98   HR: 80   BP: 122/70   CBG: na  Exercise vitals:   Highest heartrate:  85   Lowest oxygen saturation: 97   Highest blood pressure: 128/62   Liters of 02: ra  Post-exercise vitals:   SpO2: 96   HR: 81   BP: 104/62   Liters of O2: ra   CBG: na  Dr. Brand Males, Medical Director Dr. Broadus John is immediately available during today's Pulmonary Rehab session for [name] on 02/11/2014 at 1230 class time.

## 2014-02-12 ENCOUNTER — Other Ambulatory Visit: Payer: Medicare Other

## 2014-02-12 ENCOUNTER — Ambulatory Visit (HOSPITAL_BASED_OUTPATIENT_CLINIC_OR_DEPARTMENT_OTHER): Payer: Medicare Other

## 2014-02-12 ENCOUNTER — Telehealth: Payer: Self-pay | Admitting: Medical Oncology

## 2014-02-12 ENCOUNTER — Telehealth: Payer: Self-pay | Admitting: Internal Medicine

## 2014-02-12 ENCOUNTER — Ambulatory Visit (HOSPITAL_BASED_OUTPATIENT_CLINIC_OR_DEPARTMENT_OTHER): Payer: Medicare Other | Admitting: Nurse Practitioner

## 2014-02-12 ENCOUNTER — Other Ambulatory Visit: Payer: Self-pay | Admitting: Medical Oncology

## 2014-02-12 VITALS — BP 148/78 | HR 80 | Temp 97.8°F | Resp 19 | Ht 68.0 in

## 2014-02-12 DIAGNOSIS — C3490 Malignant neoplasm of unspecified part of unspecified bronchus or lung: Secondary | ICD-10-CM

## 2014-02-12 DIAGNOSIS — C3410 Malignant neoplasm of upper lobe, unspecified bronchus or lung: Secondary | ICD-10-CM

## 2014-02-12 DIAGNOSIS — L299 Pruritus, unspecified: Secondary | ICD-10-CM

## 2014-02-12 DIAGNOSIS — T451X5A Adverse effect of antineoplastic and immunosuppressive drugs, initial encounter: Secondary | ICD-10-CM

## 2014-02-12 DIAGNOSIS — M25512 Pain in left shoulder: Secondary | ICD-10-CM

## 2014-02-12 DIAGNOSIS — N189 Chronic kidney disease, unspecified: Secondary | ICD-10-CM

## 2014-02-12 DIAGNOSIS — E860 Dehydration: Secondary | ICD-10-CM

## 2014-02-12 DIAGNOSIS — D6481 Anemia due to antineoplastic chemotherapy: Secondary | ICD-10-CM

## 2014-02-12 DIAGNOSIS — K1231 Oral mucositis (ulcerative) due to antineoplastic therapy: Secondary | ICD-10-CM | POA: Insufficient documentation

## 2014-02-12 DIAGNOSIS — M25519 Pain in unspecified shoulder: Secondary | ICD-10-CM

## 2014-02-12 LAB — CBC WITH DIFFERENTIAL/PLATELET
BASO%: 0.3 % (ref 0.0–2.0)
BASOS ABS: 0 10*3/uL (ref 0.0–0.1)
EOS%: 3.1 % (ref 0.0–7.0)
Eosinophils Absolute: 0.1 10*3/uL (ref 0.0–0.5)
HEMATOCRIT: 33 % — AB (ref 38.4–49.9)
HEMOGLOBIN: 10.8 g/dL — AB (ref 13.0–17.1)
LYMPH%: 16.1 % (ref 14.0–49.0)
MCH: 32.9 pg (ref 27.2–33.4)
MCHC: 32.9 g/dL (ref 32.0–36.0)
MCV: 100.2 fL — ABNORMAL HIGH (ref 79.3–98.0)
MONO#: 0.6 10*3/uL (ref 0.1–0.9)
MONO%: 14 % (ref 0.0–14.0)
NEUT#: 2.7 10*3/uL (ref 1.5–6.5)
NEUT%: 66.5 % (ref 39.0–75.0)
Platelets: 152 10*3/uL (ref 140–400)
RBC: 3.29 10*6/uL — ABNORMAL LOW (ref 4.20–5.82)
RDW: 20.8 % — ABNORMAL HIGH (ref 11.0–14.6)
WBC: 4.1 10*3/uL (ref 4.0–10.3)
lymph#: 0.7 10*3/uL — ABNORMAL LOW (ref 0.9–3.3)

## 2014-02-12 LAB — COMPREHENSIVE METABOLIC PANEL (CC13)
ALT: 24 U/L (ref 0–55)
ANION GAP: 8 meq/L (ref 3–11)
AST: 18 U/L (ref 5–34)
Albumin: 3.2 g/dL — ABNORMAL LOW (ref 3.5–5.0)
Alkaline Phosphatase: 49 U/L (ref 40–150)
BUN: 30.6 mg/dL — AB (ref 7.0–26.0)
CALCIUM: 9 mg/dL (ref 8.4–10.4)
CHLORIDE: 97 meq/L — AB (ref 98–109)
CO2: 27 mEq/L (ref 22–29)
CREATININE: 1.5 mg/dL — AB (ref 0.7–1.3)
GLUCOSE: 185 mg/dL — AB (ref 70–140)
Potassium: 4.2 mEq/L (ref 3.5–5.1)
Sodium: 131 mEq/L — ABNORMAL LOW (ref 136–145)
Total Bilirubin: 0.63 mg/dL (ref 0.20–1.20)
Total Protein: 6.2 g/dL — ABNORMAL LOW (ref 6.4–8.3)

## 2014-02-12 MED ORDER — FLUCONAZOLE 100 MG PO TABS: 100.0000 mg | ORAL_TABLET | Freq: Every day | ORAL | Status: DC

## 2014-02-12 NOTE — Telephone Encounter (Signed)
added appt per pof....pt aware °

## 2014-02-12 NOTE — Telephone Encounter (Signed)
appt made for today 

## 2014-02-12 NOTE — Telephone Encounter (Signed)
Requests refill for diflucan and reports Left  shoulder blade pain .-Appt made for today.

## 2014-02-15 ENCOUNTER — Ambulatory Visit (INDEPENDENT_AMBULATORY_CARE_PROVIDER_SITE_OTHER): Payer: Medicare Other | Admitting: Pulmonary Disease

## 2014-02-15 ENCOUNTER — Encounter: Payer: Self-pay | Admitting: Pulmonary Disease

## 2014-02-15 VITALS — BP 126/70 | HR 82 | Ht 68.0 in | Wt 182.0 lb

## 2014-02-15 DIAGNOSIS — C3490 Malignant neoplasm of unspecified part of unspecified bronchus or lung: Secondary | ICD-10-CM

## 2014-02-15 DIAGNOSIS — J449 Chronic obstructive pulmonary disease, unspecified: Secondary | ICD-10-CM

## 2014-02-15 MED ORDER — ALBUTEROL SULFATE 108 (90 BASE) MCG/ACT IN AEPB: 1.0000 | INHALATION_SPRAY | Freq: Four times a day (QID) | RESPIRATORY_TRACT | Status: DC | PRN

## 2014-02-15 MED ORDER — IPRATROPIUM-ALBUTEROL 0.5-2.5 (3) MG/3ML IN SOLN: 3.0000 mL | Freq: Four times a day (QID) | RESPIRATORY_TRACT | Status: DC | PRN

## 2014-02-15 NOTE — Assessment & Plan Note (Signed)
Tyler [name] has never done well with long acting bronchodilators but I think that is because Tyler [name] has unrealistic expectations for his chronic, severe illness (COPD) and Tyler [name] has suffered from side effects from dry powder inhalers.  Further, when Tyler [name] has tried short acting bronchodilators like combivent I think Tyler [name] has struggled with adequate timing and inhaling the medicine appopriately.  Tyler [name] has severe COPD but Tyler [name] does not have an acute exacerbation right now.  Plan: -try changing to duoneb tid + prn albuterol respiclick as these should be very easy for use -if improvement, consider trying another LABA/ICS combo (Advair?) -continue pulmonary rehab

## 2014-02-15 NOTE — Patient Instructions (Addendum)
Use the albuterol and ipratropium through a nebulizer machine three times per day When your are short of breath and out of the house, use the albuterol respi-click inhaler. Stop taking the combivent We will see you back in 3 months or sooner if needed

## 2014-02-15 NOTE — Assessment & Plan Note (Signed)
Continue follow up with Dr. Julien Nordmann and with ongoing chemotherapy.

## 2014-02-15 NOTE — Progress Notes (Signed)
Subjective:    Patient ID: Tyler [name], male    DOB: 12/06/1932, 80 y.o.   MRN: [id number]  Synopsis: Tyler [name] first saw the Tyler [name] pulmonary office in June of 2014. On that visit he was diagnosed with moderate COPD as well as stage IV non-small cell lung cancer. He started radiation therapy for his lung cancer in July 2014 and is due to start chemotherapy early August 2014.  HPI  02/15/2014 ROV > Tyler [name] states that his mucus production has improved since starting the mucinex one week ago after he saw Dr. Lamonte [name].  His dyspnea is improving slightly but he continues to struggle with that as well as cough.  His energy level is up a little.  He continues to take chemotherapy.  He has not had a fever or chills or chest pain.  He thinks that his weight has been stable.  He has been participating in rehab and enjoys this, but he struggles to breathe sometime with the harder exercises.  He doesn't think that the combivent is really helping, but he admits that he has a hard time timing it appropriately and he's not sure if he is getting the drug in his lungs.    Past Medical History  Diagnosis Date  . BENIGN PROSTATIC HYPERTROPHY 07/23/2008  . 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 HIP PAIN, LEFT 07/04/2007  . HYPERLIPIDEMIA 01/02/2007  . HYPERTENSION 01/02/2007  . INSOMNIA 11/16/2008  . PROSTATE SPECIFIC ANTIGEN, ELEVATED 09/10/2008  . AAA (abdominal aortic aneurysm)   . Dysrhythmia     afib  . Shortness of breath   . COPD (chronic obstructive pulmonary disease)   . GERD (gastroesophageal reflux disease)   . Anemia   . History of blood transfusion   . Lung cancer dx'd 11/2012    metastatic non small cell lung cancer, adenocarcinoma      Review of Systems  Constitutional: Positive for fatigue. Negative for fever and chills.  HENT: Negative for congestion, nosebleeds, postnasal drip and rhinorrhea.   Respiratory: Positive for cough and shortness of breath. Negative for wheezing.   Cardiovascular:  Negative for chest pain and leg swelling.       Objective:   Physical Exam   Filed Vitals:   02/15/14 1559  BP: 126/70  Pulse: 82  Height: 5\' 8"  (1.727 m)  Weight: 182 lb (82.555 kg)  SpO2: 100%   RA  Gen: chronically ill appearing, no acute distress HEENT: NCAT, EOMi, OP clear,  PULM: diminished left side, few right sided crackles, no wheezing CV: RRR, no mgr, no JVD AB: BS+, soft, nontender, no hsm Ext: warm, no edema, no clubbing, no cyanosis  12/08/2012 simple spirometry ratio 55%, FEV1 0.94 L (30% predicted)     Assessment & Plan:   COPD, severe Tyler [name] has never done well with long acting bronchodilators but I think that is because he has unrealistic expectations for his chronic, severe illness (COPD) and he has suffered from side effects from dry powder inhalers.  Further, when he has tried short acting bronchodilators like combivent I think he has struggled with adequate timing and inhaling the medicine appopriately.  He has severe COPD but he does not have an acute exacerbation right now.  Plan: -try changing to duoneb tid + prn albuterol respiclick as these should be very easy for use -if improvement, consider trying another LABA/ICS combo (Advair?) -continue pulmonary rehab  Non-small cell carcinoma of lung Continue follow up with Dr. Julien [name] and with ongoing chemotherapy.  Updated Medication List Outpatient Encounter Prescriptions as of 02/15/2014  Medication Sig  . acetaminophen (TYLENOL) 325 MG tablet Take 650 mg by mouth every 6 (six) hours as needed.  Marland Kitchen azithromycin (ZITHROMAX) 250 MG tablet Take two tablets today, then 1 tablet daily until gone.  . cholecalciferol (VITAMIN D) 1000 UNITS tablet Take 1,000 Units by mouth every morning.   Marland Kitchen dexamethasone (DECADRON) 4 MG tablet TAKE 1 TAB BY MOUTH TWICE DAILY THE DAY BEFORE, THE DAY OF, & THE DAY AFTER CHEMOTHERAPY EVERY 3 WKS  . fluocinonide cream (LIDEX) 0.05 %   . folic acid (FOLVITE) 1 MG tablet  Take 1 tablet (1 mg total) by mouth daily.  Marland Kitchen guaiFENesin (MUCINEX) 600 MG 12 hr tablet Take 600 mg by mouth 2 (two) times daily.  . Ipratropium-Albuterol (COMBIVENT RESPIMAT) 20-100 MCG/ACT AERS respimat Inhale 1 puff into the lungs every 6 (six) hours.  . multivitamin-iron-minerals-folic acid (CENTRUM) chewable tablet Chew 1 tablet by mouth every morning.   . propranolol (INDERAL) 10 MG tablet Take 1 tablet by mouth 2 (two) times daily.  . [DISCONTINUED] fluconazole (DIFLUCAN) 100 MG tablet Take 1 tablet (100 mg total) by mouth daily.

## 2014-02-16 ENCOUNTER — Telehealth: Payer: Self-pay | Admitting: Pulmonary Disease

## 2014-02-16 ENCOUNTER — Encounter (HOSPITAL_COMMUNITY)
Admission: RE | Admit: 2014-02-16 | Discharge: 2014-02-16 | Disposition: A | Discharge status: Home / Self Care | Payer: Medicare Other | Source: Ambulatory Visit | Attending: Pulmonary Disease | Admitting: Pulmonary Disease

## 2014-02-16 DIAGNOSIS — R0989 Other specified symptoms and signs involving the circulatory and respiratory systems: Secondary | ICD-10-CM | POA: Diagnosis not present

## 2014-02-16 DIAGNOSIS — J4489 Other specified chronic obstructive pulmonary disease: Secondary | ICD-10-CM | POA: Insufficient documentation

## 2014-02-16 DIAGNOSIS — R0609 Other forms of dyspnea: Secondary | ICD-10-CM | POA: Insufficient documentation

## 2014-02-16 DIAGNOSIS — I319 Disease of pericardium, unspecified: Secondary | ICD-10-CM | POA: Diagnosis not present

## 2014-02-16 DIAGNOSIS — C3490 Malignant neoplasm of unspecified part of unspecified bronchus or lung: Secondary | ICD-10-CM | POA: Diagnosis not present

## 2014-02-16 DIAGNOSIS — R059 Cough, unspecified: Secondary | ICD-10-CM | POA: Insufficient documentation

## 2014-02-16 DIAGNOSIS — R05 Cough: Secondary | ICD-10-CM | POA: Insufficient documentation

## 2014-02-16 DIAGNOSIS — J449 Chronic obstructive pulmonary disease, unspecified: Secondary | ICD-10-CM | POA: Diagnosis not present

## 2014-02-16 MED ORDER — IPRATROPIUM-ALBUTEROL 0.5-2.5 (3) MG/3ML IN SOLN: 3.0000 mL | Freq: Four times a day (QID) | RESPIRATORY_TRACT | Status: DC | PRN

## 2014-02-16 NOTE — Telephone Encounter (Signed)
Called spoke with iris from [facility]. They need pt duoneb RX faxed to them at [phone]. RX printed for BQ to sign. Please advise once done thanks

## 2014-02-16 NOTE — Telephone Encounter (Signed)
This has been printed, signed, and returned to triage.

## 2014-02-16 NOTE — Progress Notes (Signed)
Today, [name] exercised at [employer]. Service time was from 1330 to 1515.  The patient exercised for more than 31 minutes performing aerobic, strengthening, and stretching exercises. Oxygen saturation, heart rate, blood pressure, rate of perceived exertion, and shortness of breath were all monitored before, during, and after exercise. [name] presented with no problems at today's exercise session.   There was no workload change during today's exercise session.  Pre-exercise vitals:   Weight kg: 81.1   Liters of O2: RA   SpO2: 99   HR: 86   BP: 110/70   CBG: NA  Exercise vitals:   Highest heartrate:  94   Lowest oxygen saturation: 87 and increased to 92 with pursed lip breathing   Highest blood pressure: 130/70   Liters of 02: RA  Post-exercise vitals:   SpO2: 96   HR: 79   BP: 120/80   Liters of O2: RA   CBG: NA Dr. Brand Males, Medical Director Dr. Broadus John is immediately available during today's Pulmonary Rehab session for Tyler [name] on 02/16/2014 at 1330 class time.

## 2014-02-16 NOTE — Telephone Encounter (Signed)
RX has been faxed back

## 2014-02-17 ENCOUNTER — Telehealth: Payer: Self-pay | Admitting: Pulmonary Disease

## 2014-02-17 ENCOUNTER — Other Ambulatory Visit: Payer: Self-pay | Admitting: Physician Assistant

## 2014-02-17 ENCOUNTER — Encounter: Payer: Self-pay | Admitting: Nurse Practitioner

## 2014-02-17 DIAGNOSIS — T451X5A Adverse effect of antineoplastic and immunosuppressive drugs, initial encounter: Secondary | ICD-10-CM | POA: Insufficient documentation

## 2014-02-17 DIAGNOSIS — L299 Pruritus, unspecified: Secondary | ICD-10-CM | POA: Insufficient documentation

## 2014-02-17 DIAGNOSIS — N189 Chronic kidney disease, unspecified: Secondary | ICD-10-CM | POA: Insufficient documentation

## 2014-02-17 DIAGNOSIS — D6481 Anemia due to antineoplastic chemotherapy: Secondary | ICD-10-CM | POA: Insufficient documentation

## 2014-02-17 DIAGNOSIS — M25512 Pain in left shoulder: Secondary | ICD-10-CM | POA: Insufficient documentation

## 2014-02-17 MED ORDER — IPRATROPIUM-ALBUTEROL 0.5-2.5 (3) MG/3ML IN SOLN: 3.0000 mL | Freq: Three times a day (TID) | RESPIRATORY_TRACT | Status: DC

## 2014-02-17 NOTE — Assessment & Plan Note (Signed)
The patient also continues to complain of some chronic, intermittent left upper back pruritus.  He states this is the location of his previous radiation therapy.  Advised patient to try some hydrocortisone cream to this area.

## 2014-02-17 NOTE — Assessment & Plan Note (Signed)
Mild chemotherapy-induced anemia with a hemoglobin of decreased from 11.5 down to 10.8.  Patient denies any worsening issues with either fatigue or shortness exertion.

## 2014-02-17 NOTE — Telephone Encounter (Signed)
Called spoke with pt. Aware no samples at this time. He will call later this week. Nothing further needed

## 2014-02-17 NOTE — Assessment & Plan Note (Signed)
Labs today do reveal some mild dehydration.  Patient continues to insist that he is not dehydrated; and feels no need for any IV fluid rehydration today.  Patient was encouraged to continue to push fluids is much as possible.

## 2014-02-17 NOTE — Assessment & Plan Note (Signed)
History of chronic renal insufficiency continues the; with a creatinine of 1.5 today.

## 2014-02-17 NOTE — Assessment & Plan Note (Signed)
Patient states that he continues with chronic, intermittent left shoulder discomfort.  He denies any known injury or trauma to this area.  Patient was observed with full range of motion to the left shoulder; but does experience some discomfort when he lifts his arm.  No evidence of any abnormality of flush or on exam.  Patient states he has a refused and orthopedic referral for left shoulder discomfort in the past.  The patient will be obtaining a restaging CT of the chest/abdomen/pelvis following his next cycle of chemotherapy to further evaluate his left shoulder pain.

## 2014-02-17 NOTE — Assessment & Plan Note (Signed)
Patient completed cycle 5 of Alimta chemotherapy on August 19 at 2015.  This is cycled on an every three-week basis.  The patient is due for cycle 6 of the same regimen on 03/03/2014.  Following cycle 6 of his chemotherapy-patient will be due for restaging CT with contrast of the chest/abdomen/pelvis.

## 2014-02-17 NOTE — Progress Notes (Signed)
[place]   Chief Complaint  Patient presents with  . Mucositis    HPI: Tyler [name] 80 y.o. male diagnosed with lung cancer.  Currently undergoing Alimta chemotherapy regimen on an every three-week basis.  Patient received cycle 5 of his Alimta on 02/03/2014.  Patient called the cancer Center today requesting an urgent care visit.  He is complaining of some chemotherapy therapy-induced mucositis.  He is a retired Software engineer; and states that he initiated oral Diflucan 100 mg on a daily basis 3 days ago.  He states that the Diflucan has greatly improved his mucositis issues.  He is requesting a refill of the Diflucan today.  The patient is also complaining of a chronic, intermittent left shoulder pain.  He denies any known injury or trauma to this area.  He also states that his left upper back area is intermittently pruritic as well.  He states that this is the area of previous radiation treatments.  The patient denies any other new symptoms whatsoever.  He denies any recent fevers or chills.  Patient and his wife both report that he has plans to followup with his pulmonologist Dr. Lake Bells this coming Monday, 02/15/2014.   HPI  CURRENT THERAPY: Upcoming Treatment Dates - LUNG Pemetrexed (Alimta) q21d  Days with orders from any treatment category:  03/03/2014      [facility] ONC SCHEDULING COMMUNICATION      ondansetron (ZOFRAN) IVPB 8 mg      dexamethasone (DECADRON) injection 10 mg      PEMEtrexed (ALIMTA) 775 mg in sodium chloride 0.9 % 100 mL chemo infusion      sodium chloride 0.9 % injection 10 mL      heparin lock flush 100 unit/mL      heparin lock flush 100 unit/mL      alteplase (CATHFLO ACTIVASE) injection 2 mg      sodium chloride 0.9 % injection 3 mL      cyanocobalamin ((VITAMIN B-12)) injection 1,000 mcg      0.9 %  sodium chloride infusion      TREATMENT CONDITIONS 03/24/2014      [facility] ONC SCHEDULING COMMUNICATION      ondansetron (ZOFRAN) IVPB 8 mg   dexamethasone (DECADRON) injection 10 mg      PEMEtrexed (ALIMTA) 775 mg in sodium chloride 0.9 % 100 mL chemo infusion      sodium chloride 0.9 % injection 10 mL      heparin lock flush 100 unit/mL      heparin lock flush 100 unit/mL      alteplase (CATHFLO ACTIVASE) injection 2 mg      sodium chloride 0.9 % injection 3 mL      cyanocobalamin ((VITAMIN B-12)) injection 1,000 mcg      0.9 %  sodium chloride infusion      TREATMENT CONDITIONS 04/14/2014      [facility] ONC SCHEDULING COMMUNICATION      ondansetron (ZOFRAN) IVPB 8 mg      dexamethasone (DECADRON) injection 10 mg      PEMEtrexed (ALIMTA) 775 mg in sodium chloride 0.9 % 100 mL chemo infusion      sodium chloride 0.9 % injection 10 mL      heparin lock flush 100 unit/mL      heparin lock flush 100 unit/mL      alteplase (CATHFLO ACTIVASE) injection 2 mg      sodium chloride 0.9 % injection 3 mL      cyanocobalamin ((VITAMIN  B-12)) injection 1,000 mcg      0.9 %  sodium chloride infusion      TREATMENT CONDITIONS    ROS  Past Medical History  Diagnosis Date  . BENIGN PROSTATIC HYPERTROPHY 07/23/2008  . 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 HIP PAIN, LEFT 07/04/2007  . HYPERLIPIDEMIA 01/02/2007  . HYPERTENSION 01/02/2007  . INSOMNIA 11/16/2008  . PROSTATE SPECIFIC ANTIGEN, ELEVATED 09/10/2008  . AAA (abdominal aortic aneurysm)   . Dysrhythmia     afib  . Shortness of breath   . COPD (chronic obstructive pulmonary disease)   . GERD (gastroesophageal reflux disease)   . Anemia   . History of blood transfusion   . Lung cancer dx'd 11/2012    metastatic non small cell lung cancer, adenocarcinoma     Past Surgical History  Procedure Laterality Date  . Transurethral resection of prostate  2004  . Total hip arthroplasty  2010, 2011    2010- left; 2011- right  . Upper gastrointestinal endoscopy    . Abdominal aortic endovascular stent graft N/A 04/14/2013    Procedure: ABDOMINAL AORTIC ENDOVASCULAR STENT GRAFT;  Surgeon:  Conrad Lake Park, MD;  Location: [facility];  Service: Vascular;  Laterality: N/A;    has HYPERLIPIDEMIA; HYPERTENSION; BENIGN PROSTATIC HYPERTROPHY; ERECTILE DYSFUNCTION, ORGANIC; HIP PAIN, LEFT; INSOMNIA; PROSTATE SPECIFIC ANTIGEN, ELEVATED; Dyspnea; COPD, severe; Pericardial effusion; Non-small cell carcinoma of lung; Cough; New onset a-fib; Hypotension; Dehydration; Hyponatremia; Pancytopenia; Aneurysm of infrarenal abdominal aorta; Abdominal aneurysm without mention of rupture; Mucositis (ulcerative) due to antineoplastic therapy; Shoulder pain, left; Pruritus; Chronic renal insufficiency; and Antineoplastic chemotherapy induced anemia(285.3) on his problem list.     has No Known Allergies.    Medication List       This list is accurate as of: 02/12/14 11:59 PM.  Always use your most recent med list.               acetaminophen 325 MG tablet  Commonly known as:  TYLENOL  Take 650 mg by mouth every 6 (six) hours as needed.     azithromycin 250 MG tablet  Commonly known as:  ZITHROMAX  Take two tablets today, then 1 tablet daily until gone.     cholecalciferol 1000 UNITS tablet  Commonly known as:  VITAMIN D  Take 1,000 Units by mouth every morning.     dexamethasone 4 MG tablet  Commonly known as:  DECADRON  TAKE 1 TAB BY MOUTH TWICE DAILY THE DAY BEFORE, THE DAY OF, & THE DAY AFTER CHEMOTHERAPY EVERY 3 WKS     fluconazole 100 MG tablet  Commonly known as:  DIFLUCAN  Take 1 tablet (100 mg total) by mouth daily.     fluocinonide cream 0.05 %  Commonly known as:  LIDEX     folic acid 1 MG tablet  Commonly known as:  FOLVITE  Take 1 tablet (1 mg total) by mouth daily.     Ipratropium-Albuterol 20-100 MCG/ACT Aers respimat  Commonly known as:  COMBIVENT RESPIMAT  Inhale 1 puff into the lungs every 6 (six) hours.     multivitamin-iron-minerals-folic acid chewable tablet  Chew 1 tablet by mouth every morning.     propranolol 10 MG tablet  Commonly known as:  INDERAL  Take 1  tablet by mouth 2 (two) times daily.         PHYSICAL EXAMINATION  Blood pressure 148/78, pulse 80, temperature 97.8 F (36.6 C), temperature source Oral, resp. rate 19, height 5\' 8"  (1.727 m), SpO2  99.00%.  Physical Exam  Nursing note and vitals reviewed. Constitutional: He is oriented to person, place, and time and well-developed, well-nourished, and in no distress. No distress.  HENT:  Head: Normocephalic and atraumatic.  No oral lesions noted.  No white coating to tongue.  Tongue appears slightly erythmatous only.   Eyes: Conjunctivae are normal. Pupils are equal, round, and reactive to light.  Neck: Normal range of motion. Neck supple.  Cardiovascular: Normal rate, regular rhythm, normal heart sounds and intact distal pulses.  Exam reveals no friction rub.   No murmur heard. Pulmonary/Chest: Effort normal and breath sounds normal. No respiratory distress.  Abdominal: Soft. Bowel sounds are normal. He exhibits no distension. There is no tenderness. There is no rebound.  Musculoskeletal: Normal range of motion. He exhibits tenderness. He exhibits no edema.  Mild discomfort experienced inpatient fully extends his left arm only.  No evidence of injury or trauma to left shoulder area.  Lymphadenopathy:    He has no cervical adenopathy.  Neurological: He is alert and oriented to person, place, and time. Gait normal.  Skin: Skin is warm and dry. No rash noted. No erythema.  Area of left upper back pruritus with no evidence of rash or infection.  Psychiatric: Affect normal.    LABORATORY DATA:. CBC  Lab Results  Component Value Date   WBC 4.1 02/12/2014   RBC 3.29* 02/12/2014   HGB 10.8* 02/12/2014   HCT 33.0* 02/12/2014   PLT 152 02/12/2014   MCV 100.2* 02/12/2014   MCH 32.9 02/12/2014   MCHC 32.9 02/12/2014   RDW 20.8* 02/12/2014   LYMPHSABS 0.7* 02/12/2014   MONOABS 0.6 02/12/2014   EOSABS 0.1 02/12/2014   BASOSABS 0.0 02/12/2014     CMET  Lab Results  Component Value Date     NA 131* 02/12/2014   K 4.2 02/12/2014   CL 96 04/15/2013   CO2 27 02/12/2014   GLUCOSE 185* 02/12/2014   BUN 30.6* 02/12/2014   CREATININE 1.5* 02/12/2014   CALCIUM 9.0 02/12/2014   PROT 6.2* 02/12/2014   ALBUMIN 3.2* 02/12/2014   AST 18 02/12/2014   ALT 24 02/12/2014   ALKPHOS 49 02/12/2014   BILITOT 0.63 02/12/2014   GFRNONAA 53* 04/15/2013   GFRAA 61* 04/15/2013    ASSESSMENT/PLAN:    Non-small cell carcinoma of lung  Assessment & Plan Patient completed cycle 5 of Alimta chemotherapy on August 19 at 2015.  This is cycled on an every three-week basis.  The patient is due for cycle 6 of the same regimen on 03/03/2014.  Following cycle 6 of his chemotherapy-patient will be due for restaging CT with contrast of the chest/abdomen/pelvis.   Dehydration  Assessment & Plan Labs today do reveal some mild dehydration.  Patient continues to insist that he is not dehydrated; and feels no need for any IV fluid rehydration today.  Patient was encouraged to continue to push fluids is much as possible.   Mucositis (ulcerative) due to antineoplastic therapy  Assessment & Plan Chemotherapy-induced the mucositis.  The patient states that he has taken Diflucan 100 mg daily for the past 3 days; and his mucositis issues have greatly improved.  He is requesting a refill of the oral Diflucan at today.  Diflucan 100 mg on a daily basis was again prescribed for patient.  The patient states that he prefers not to use any Magic mouthwash-type mouth rinses at this time.  I did I reviewed need to use the baking soda/salt swish and spit  on a very regular basis as well.   Shoulder pain, left  Assessment & Plan Patient states that he continues with chronic, intermittent left shoulder discomfort.  He denies any known injury or trauma to this area.  Patient was observed with full range of motion to the left shoulder; but does experience some discomfort when he lifts his arm.  No evidence of any abnormality of flush  or on exam.  Patient states he has a refused and orthopedic referral for left shoulder discomfort in the past.  The patient will be obtaining a restaging CT of the chest/abdomen/pelvis following his next cycle of chemotherapy to further evaluate his left shoulder pain.   Pruritus  Assessment & Plan The patient also continues to complain of some chronic, intermittent left upper back pruritus.  He states this is the location of his previous radiation therapy.  Advised patient to try some hydrocortisone cream to this area.   Chronic renal insufficiency  Assessment & Plan History of chronic renal insufficiency continues the; with a creatinine of 1.5 today.   Antineoplastic chemotherapy induced anemia(285.3)  Assessment & Plan Mild chemotherapy-induced anemia with a hemoglobin of decreased from 11.5 down to 10.8.  Patient denies any worsening issues with either fatigue or shortness exertion.   Patient stated understanding of all instructions; and was in agreement with this plan of care. The patient knows to call the clinic with any problems, questions or concerns.   Review/collaboration with Dr. Julien Nordmann regarding all aspects of patient's visit today.   Total time spent with patient was 25 minutes;  with greater than 75 percent of that time spent in face to face counseling regarding his symptoms, and coordination of care and follow up.  Disclaimer: This note was dictated with voice recognition software. Similar sounding words can inadvertently be transcribed and may not be corrected upon review.   Drue Second, NP 02/17/2014

## 2014-02-17 NOTE — Telephone Encounter (Signed)
According to last OV note 02/15/14 pt is to use duoneb TID.  [facility]. Gave VO. Nothing further needed

## 2014-02-17 NOTE — Assessment & Plan Note (Signed)
Chemotherapy-induced the mucositis.  The patient states that he has taken Diflucan 100 mg daily for the past 3 days; and his mucositis issues have greatly improved.  He is requesting a refill of the oral Diflucan at today.  Diflucan 100 mg on a daily basis was again prescribed for patient.  The patient states that he prefers not to use any Magic mouthwash-type mouth rinses at this time.  I did I reviewed need to use the baking soda/salt swish and spit on a very regular basis as well.

## 2014-02-18 ENCOUNTER — Encounter (HOSPITAL_COMMUNITY)
Admission: RE | Admit: 2014-02-18 | Discharge: 2014-02-18 | Disposition: A | Discharge status: Home / Self Care | Payer: Medicare Other | Source: Ambulatory Visit | Attending: Pulmonary Disease | Admitting: Pulmonary Disease

## 2014-02-18 DIAGNOSIS — R0989 Other specified symptoms and signs involving the circulatory and respiratory systems: Secondary | ICD-10-CM | POA: Diagnosis not present

## 2014-02-18 DIAGNOSIS — R0609 Other forms of dyspnea: Secondary | ICD-10-CM | POA: Diagnosis not present

## 2014-02-18 NOTE — Progress Notes (Signed)
Today, Tyler [name] exercised at [employer]. Service time was from 1330 to 1530.  The patient exercised for more than 31 minutes performing aerobic, strengthening, and stretching exercises. Oxygen saturation, heart rate, blood pressure, rate of perceived exertion, and shortness of breath were all monitored before, during, and after exercise. Tyler [name] presented with no problems at today's exercise session. Tyler [name] also attended an education session on home oxygen use and safety.  There was no workload change during today's exercise session.  Pre-exercise vitals:   Weight kg: 80.9   Liters of O2: ra   SpO2: 96   HR: 80   BP: 108/70   CBG: na  Exercise vitals:   Highest heartrate:  85   Lowest oxygen saturation: 96   Highest blood pressure: 140/80   Liters of 02: ra  Post-exercise vitals:   SpO2: 94   HR: 85   BP: 120/60   Liters of O2: ra   CBG: na  Dr. Brand Males, Medical Director Dr. Wendee Beavers is immediately available during today's Pulmonary Rehab session for Tyler [name] on 02/18/2014 at 1330 class time.

## 2014-02-19 ENCOUNTER — Telehealth: Payer: Self-pay | Admitting: Pulmonary Disease

## 2014-02-19 NOTE — Telephone Encounter (Signed)
Spoke with [name] and notified that we do not have any samples at this time  I offered to send refill and she refused  Nothing further needed

## 2014-02-19 NOTE — Telephone Encounter (Signed)
lmomtcb x1 for pt No samples at this time

## 2014-02-23 ENCOUNTER — Telehealth (HOSPITAL_COMMUNITY): Payer: Self-pay | Admitting: Family Medicine

## 2014-02-23 ENCOUNTER — Encounter (HOSPITAL_COMMUNITY): Payer: Medicare Other

## 2014-02-24 ENCOUNTER — Ambulatory Visit: Payer: Medicare Other

## 2014-02-24 ENCOUNTER — Other Ambulatory Visit: Payer: Medicare Other

## 2014-02-25 ENCOUNTER — Encounter (HOSPITAL_COMMUNITY): Payer: Medicare Other

## 2014-02-26 ENCOUNTER — Other Ambulatory Visit: Payer: Self-pay

## 2014-02-26 MED ORDER — IPRATROPIUM-ALBUTEROL 20-100 MCG/ACT IN AERS: 1.0000 | INHALATION_SPRAY | Freq: Four times a day (QID) | RESPIRATORY_TRACT | Status: DC

## 2014-03-02 ENCOUNTER — Encounter (HOSPITAL_COMMUNITY): Payer: Medicare Other

## 2014-03-03 ENCOUNTER — Ambulatory Visit (HOSPITAL_BASED_OUTPATIENT_CLINIC_OR_DEPARTMENT_OTHER): Payer: Medicare Other | Admitting: Nurse Practitioner

## 2014-03-03 ENCOUNTER — Other Ambulatory Visit (HOSPITAL_BASED_OUTPATIENT_CLINIC_OR_DEPARTMENT_OTHER): Payer: Medicare Other

## 2014-03-03 ENCOUNTER — Telehealth: Payer: Self-pay | Admitting: Nurse Practitioner

## 2014-03-03 ENCOUNTER — Telehealth: Payer: Self-pay | Admitting: *Deleted

## 2014-03-03 ENCOUNTER — Ambulatory Visit (HOSPITAL_BASED_OUTPATIENT_CLINIC_OR_DEPARTMENT_OTHER): Payer: Medicare Other

## 2014-03-03 VITALS — BP 153/87 | HR 80 | Temp 97.5°F | Resp 18 | Ht 68.0 in | Wt 182.8 lb

## 2014-03-03 DIAGNOSIS — C3490 Malignant neoplasm of unspecified part of unspecified bronchus or lung: Secondary | ICD-10-CM

## 2014-03-03 DIAGNOSIS — C3410 Malignant neoplasm of upper lobe, unspecified bronchus or lung: Secondary | ICD-10-CM

## 2014-03-03 DIAGNOSIS — Z5111 Encounter for antineoplastic chemotherapy: Secondary | ICD-10-CM

## 2014-03-03 DIAGNOSIS — N189 Chronic kidney disease, unspecified: Secondary | ICD-10-CM

## 2014-03-03 DIAGNOSIS — K4091 Unilateral inguinal hernia, without obstruction or gangrene, recurrent: Secondary | ICD-10-CM

## 2014-03-03 LAB — COMPREHENSIVE METABOLIC PANEL (CC13)
ALT: 19 U/L (ref 0–55)
AST: 21 U/L (ref 5–34)
Albumin: 3.5 g/dL (ref 3.5–5.0)
Alkaline Phosphatase: 56 U/L (ref 40–150)
Anion Gap: 12 mEq/L — ABNORMAL HIGH (ref 3–11)
BUN: 17.4 mg/dL (ref 7.0–26.0)
CALCIUM: 9.1 mg/dL (ref 8.4–10.4)
CHLORIDE: 97 meq/L — AB (ref 98–109)
CO2: 22 meq/L (ref 22–29)
CREATININE: 1.5 mg/dL — AB (ref 0.7–1.3)
Glucose: 167 mg/dl — ABNORMAL HIGH (ref 70–140)
Potassium: 4.3 mEq/L (ref 3.5–5.1)
Sodium: 132 mEq/L — ABNORMAL LOW (ref 136–145)
Total Bilirubin: 0.44 mg/dL (ref 0.20–1.20)
Total Protein: 6.6 g/dL (ref 6.4–8.3)

## 2014-03-03 LAB — CBC WITH DIFFERENTIAL/PLATELET
BASO%: 0.2 % (ref 0.0–2.0)
BASOS ABS: 0 10*3/uL (ref 0.0–0.1)
EOS%: 0.2 % (ref 0.0–7.0)
Eosinophils Absolute: 0 10*3/uL (ref 0.0–0.5)
HEMATOCRIT: 34.1 % — AB (ref 38.4–49.9)
HEMOGLOBIN: 11.6 g/dL — AB (ref 13.0–17.1)
LYMPH%: 15.6 % (ref 14.0–49.0)
MCH: 33.2 pg (ref 27.2–33.4)
MCHC: 34 g/dL (ref 32.0–36.0)
MCV: 97.7 fL (ref 79.3–98.0)
MONO#: 0.8 10*3/uL (ref 0.1–0.9)
MONO%: 11.7 % (ref 0.0–14.0)
NEUT#: 4.7 10*3/uL (ref 1.5–6.5)
NEUT%: 72.3 % (ref 39.0–75.0)
Platelets: 182 10*3/uL (ref 140–400)
RBC: 3.49 10*6/uL — ABNORMAL LOW (ref 4.20–5.82)
RDW: 16.8 % — ABNORMAL HIGH (ref 11.0–14.6)
WBC: 6.4 10*3/uL (ref 4.0–10.3)
lymph#: 1 10*3/uL (ref 0.9–3.3)

## 2014-03-03 MED ORDER — CYANOCOBALAMIN 1000 MCG/ML IJ SOLN
INTRAMUSCULAR | Status: AC
  Filled 2014-03-03: qty 1

## 2014-03-03 MED ORDER — ONDANSETRON 8 MG/NS 50 ML IVPB
INTRAVENOUS | Status: AC
  Filled 2014-03-03: qty 8

## 2014-03-03 MED ORDER — SODIUM CHLORIDE 0.9 % IV SOLN
Freq: Once | INTRAVENOUS | Status: AC
  Administered 2014-03-03: 13:00:00 via INTRAVENOUS

## 2014-03-03 MED ORDER — DEXAMETHASONE SODIUM PHOSPHATE 10 MG/ML IJ SOLN
10.0000 mg | Freq: Once | INTRAMUSCULAR | Status: AC
  Administered 2014-03-03: 10 mg via INTRAVENOUS

## 2014-03-03 MED ORDER — SODIUM CHLORIDE 0.9 % IV SOLN
400.0000 mg/m2 | Freq: Once | INTRAVENOUS | Status: AC
  Administered 2014-03-03: 775 mg via INTRAVENOUS
  Filled 2014-03-03: qty 31

## 2014-03-03 MED ORDER — ONDANSETRON 8 MG/50ML IVPB (CHCC)
8.0000 mg | Freq: Once | INTRAVENOUS | Status: AC
  Administered 2014-03-03: 8 mg via INTRAVENOUS

## 2014-03-03 MED ORDER — HEPARIN SOD (PORK) LOCK FLUSH 100 UNIT/ML IV SOLN
500.0000 [IU] | Freq: Once | INTRAVENOUS | Status: DC | PRN
  Filled 2014-03-03: qty 5

## 2014-03-03 MED ORDER — DEXAMETHASONE SODIUM PHOSPHATE 10 MG/ML IJ SOLN
INTRAMUSCULAR | Status: AC
  Filled 2014-03-03: qty 1

## 2014-03-03 MED ORDER — SODIUM CHLORIDE 0.9 % IJ SOLN
10.0000 mL | INTRAMUSCULAR | Status: DC | PRN
  Filled 2014-03-03: qty 10

## 2014-03-03 MED ORDER — CYANOCOBALAMIN 1000 MCG/ML IJ SOLN
1000.0000 ug | Freq: Once | INTRAMUSCULAR | Status: AC
  Administered 2014-03-03: 1000 ug via INTRAMUSCULAR

## 2014-03-03 NOTE — Telephone Encounter (Signed)
cld Tyler [name] in [facility] to fax medical records & demo to [facility] @[phone] -per CSS

## 2014-03-03 NOTE — Telephone Encounter (Signed)
Per staff message and POF I have scheduled appts. Advised scheduler of appts. JMW  

## 2014-03-03 NOTE — Patient Instructions (Signed)
[facility] Discharge Instructions for Patients Receiving Chemotherapy  Today you received the following chemotherapy agents :  Alimta.  To help prevent nausea and vomiting after your treatment, we encourage you to take your nausea medication as instructed by your physician.   If you develop nausea and vomiting that is not controlled by your nausea medication, call the clinic.   BELOW ARE SYMPTOMS THAT SHOULD BE REPORTED IMMEDIATELY:  *FEVER GREATER THAN 100.5 F  *CHILLS WITH OR WITHOUT FEVER  NAUSEA AND VOMITING THAT IS NOT CONTROLLED WITH YOUR NAUSEA MEDICATION  *UNUSUAL SHORTNESS OF BREATH  *UNUSUAL BRUISING OR BLEEDING  TENDERNESS IN MOUTH AND THROAT WITH OR WITHOUT PRESENCE OF ULCERS  *URINARY PROBLEMS  *BOWEL PROBLEMS  UNUSUAL RASH Items with * indicate a potential emergency and should be followed up as soon as possible.  Feel free to call the clinic you have any questions or concerns. The clinic phone number is [phone].

## 2014-03-04 ENCOUNTER — Encounter (HOSPITAL_COMMUNITY): Payer: Medicare Other

## 2014-03-04 ENCOUNTER — Telehealth: Payer: Self-pay | Admitting: Medical Oncology

## 2014-03-04 ENCOUNTER — Telehealth: Payer: Self-pay | Admitting: Internal Medicine

## 2014-03-04 NOTE — Telephone Encounter (Signed)
CT put back on 03/22/14. Natalie informed.

## 2014-03-04 NOTE — Telephone Encounter (Signed)
MEDICAL RECORDS FAXED TO [facility] @ [phone]

## 2014-03-09 ENCOUNTER — Encounter (HOSPITAL_COMMUNITY)
Admission: RE | Admit: 2014-03-09 | Discharge: 2014-03-09 | Disposition: A | Discharge status: Home / Self Care | Payer: Medicare Other | Source: Ambulatory Visit | Attending: Pulmonary Disease | Admitting: Pulmonary Disease

## 2014-03-09 DIAGNOSIS — R0609 Other forms of dyspnea: Secondary | ICD-10-CM | POA: Diagnosis not present

## 2014-03-09 NOTE — Progress Notes (Signed)
Today, Susana exercised at [employer]. [facility]. Service time was from 1330 to 1500.  The patient exercised for more than 31 minutes performing aerobic, strengthening, and stretching exercises. Oxygen saturation, heart rate, blood pressure, rate of perceived exertion, and shortness of breath were all monitored before, during, and after exercise. [place] presented with no problems at today's exercise session.   There was no workload change during today's exercise session.  Pre-exercise vitals:   Weight kg: 82.1   Liters of O2: ra   SpO2: 98   HR: 82   BP: 120/68   CBG: na  Exercise vitals:   Highest heartrate:  91   Lowest oxygen saturation: 93   Highest blood pressure: 114/60   Liters of 02: ra  Post-exercise vitals:   SpO2: 99   HR: 83   BP: 114/66   Liters of O2: ra   CBG: na  Dr. Brand Males, Medical Director Dr. Wyline Copas is immediately available during today's Pulmonary Rehab session for [name] on 03/09/2014 at 1330 class time.

## 2014-03-10 ENCOUNTER — Encounter: Payer: Self-pay | Admitting: Nurse Practitioner

## 2014-03-10 DIAGNOSIS — K4090 Unilateral inguinal hernia, without obstruction or gangrene, not specified as recurrent: Secondary | ICD-10-CM | POA: Insufficient documentation

## 2014-03-10 NOTE — Assessment & Plan Note (Signed)
Patient does have a fairly large left inguinal hernia that is chronic for him.  He is able to reduce the hernia with no difficulty whatsoever.  Patient will be referred to Gen. surgery for further evaluation of his hernia.

## 2014-03-10 NOTE — Assessment & Plan Note (Signed)
Patient does suffer with chronic renal insufficiency; and his creatinine remained stable at present.

## 2014-03-10 NOTE — Assessment & Plan Note (Signed)
Patient appears to be tolerating his chemotherapy fairly well.  Reviewed all lab results with both patient and his wife in detail today.  Patient will proceed today with cycle 6 of his Alimta chemotherapy regimen.  He has plans to receive his next cycle of chemotherapy on 03/17/2014.  He has restaging scan scheduled for 03/22/2014.

## 2014-03-10 NOTE — Progress Notes (Signed)
[place]   Chief Complaint  Patient presents with  . Follow-up    HPI: Tyler [name] 80 y.o. male diagnosed with lung cancer.  Patient is currently undergoing Alimta chemotherapy regimen.  Patient presents today for cycle 6 of his Alimta chemotherapy regimen.  He appears to be tolerating his chemotherapy fairly well.  He states that that his energy level and his appetite remains stable.  He denies any recent fever or chills.  He is at previous issues with mucositis have completely resolved at this time.  He continues to go to pulmonary rehabilitation on a regular basis each week.  He has a Physiological scientist come to his home once weekly as well for strength conditioning exercises.  He has plans to travel on a family cruise on 04/02/2014.   HPI  CURRENT THERAPY: Upcoming Treatment Dates - LUNG Pemetrexed (Alimta) q21d  Days with orders from any treatment category:  03/24/2014      SCHEDULING COMMUNICATION      ondansetron (ZOFRAN) IVPB 8 mg      dexamethasone (DECADRON) injection 10 mg      PEMEtrexed (ALIMTA) 775 mg in sodium chloride 0.9 % 100 mL chemo infusion      sodium chloride 0.9 % injection 10 mL      heparin lock flush 100 unit/mL      heparin lock flush 100 unit/mL      alteplase (CATHFLO ACTIVASE) injection 2 mg      sodium chloride 0.9 % injection 3 mL      cyanocobalamin ((VITAMIN B-12)) injection 1,000 mcg      0.9 %  sodium chloride infusion      TREATMENT CONDITIONS 04/14/2014      SCHEDULING COMMUNICATION      ondansetron (ZOFRAN) IVPB 8 mg      dexamethasone (DECADRON) injection 10 mg      PEMEtrexed (ALIMTA) 775 mg in sodium chloride 0.9 % 100 mL chemo infusion      sodium chloride 0.9 % injection 10 mL      heparin lock flush 100 unit/mL      heparin lock flush 100 unit/mL      alteplase (CATHFLO ACTIVASE) injection 2 mg      sodium chloride 0.9 % injection 3 mL      cyanocobalamin ((VITAMIN B-12)) injection 1,000 mcg      0.9 %  sodium  chloride infusion      TREATMENT CONDITIONS 05/05/2014      SCHEDULING COMMUNICATION      ondansetron (ZOFRAN) IVPB 8 mg      dexamethasone (DECADRON) injection 10 mg      PEMEtrexed (ALIMTA) 775 mg in sodium chloride 0.9 % 100 mL chemo infusion      sodium chloride 0.9 % injection 10 mL      heparin lock flush 100 unit/mL      heparin lock flush 100 unit/mL      alteplase (CATHFLO ACTIVASE) injection 2 mg      sodium chloride 0.9 % injection 3 mL      cyanocobalamin ((VITAMIN B-12)) injection 1,000 mcg      0.9 %  sodium chloride infusion      TREATMENT CONDITIONS    ROS  Past Medical History  Diagnosis Date  . BENIGN PROSTATIC HYPERTROPHY 07/23/2008  . 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 HIP PAIN, LEFT 07/04/2007  . HYPERLIPIDEMIA 01/02/2007  . HYPERTENSION 01/02/2007  . INSOMNIA 11/16/2008  . PROSTATE SPECIFIC ANTIGEN, ELEVATED  09/10/2008  . AAA (abdominal aortic aneurysm)   . Dysrhythmia     afib  . Shortness of breath   . COPD (chronic obstructive pulmonary disease)   . GERD (gastroesophageal reflux disease)   . Anemia   . History of blood transfusion   . Lung cancer dx'd 11/2012    metastatic non small cell lung cancer, adenocarcinoma     Past Surgical History  Procedure Laterality Date  . Transurethral resection of prostate  2004  . Total hip arthroplasty  2010, 2011    2010- left; 2011- right  . Upper gastrointestinal endoscopy    . Abdominal aortic endovascular stent graft N/A 04/14/2013    Procedure: ABDOMINAL AORTIC ENDOVASCULAR STENT GRAFT;  Surgeon: Conrad Wailea, MD;  Location: [facility];  Service: Vascular;  Laterality: N/A;    has HYPERLIPIDEMIA; HYPERTENSION; BENIGN PROSTATIC HYPERTROPHY; ERECTILE DYSFUNCTION, ORGANIC; HIP PAIN, LEFT; INSOMNIA; PROSTATE SPECIFIC ANTIGEN, ELEVATED; Dyspnea; COPD, severe; Pericardial effusion; Non-small cell carcinoma of lung; Cough; New onset a-fib; Hypotension; Dehydration; Hyponatremia; Pancytopenia; Aneurysm of infrarenal  abdominal aorta; Abdominal aneurysm without mention of rupture; Mucositis (ulcerative) due to antineoplastic therapy; Shoulder pain, left; Pruritus; Chronic renal insufficiency; Antineoplastic chemotherapy induced anemia(285.3); and Inguinal hernia on his problem list.     has No Known Allergies.    Medication List       This list is accurate as of: 03/03/14 11:59 PM.  Always use your most recent med list.               acetaminophen 325 MG tablet  Commonly known as:  TYLENOL  Take 650 mg by mouth every 6 (six) hours as needed.     Albuterol Sulfate 108 (90 BASE) MCG/ACT Aepb  Commonly known as:  PROAIR RESPICLICK  Inhale 1 puff into the lungs every 6 (six) hours as needed (shortness of breath).     cholecalciferol 1000 UNITS tablet  Commonly known as:  VITAMIN D  Take 1,000 Units by mouth every morning.     dexamethasone 4 MG tablet  Commonly known as:  DECADRON  TAKE 1 TAB BY MOUTH TWICE DAILY THE DAY BEFORE, THE DAY OF, & THE DAY AFTER CHEMOTHERAPY EVERY 3 WKS     folic acid 1 MG tablet  Commonly known as:  FOLVITE  Take 1 tablet (1 mg total) by mouth daily.     guaiFENesin 600 MG 12 hr tablet  Commonly known as:  MUCINEX  Take 600 mg by mouth 2 (two) times daily.     ipratropium-albuterol 0.5-2.5 (3) MG/3ML Soln  Commonly known as:  DUONEB  Take 3 mLs by nebulization 3 (three) times daily.     multivitamin-iron-minerals-folic acid chewable tablet  Chew 1 tablet by mouth every morning.     propranolol 10 MG tablet  Commonly known as:  INDERAL  Take 1 tablet by mouth 2 (two) times daily.         PHYSICAL EXAMINATION  Blood pressure 153/87, pulse 80, temperature 97.5 F (36.4 C), temperature source Oral, resp. rate 18, height 5\' 8"  (1.727 m), weight 182 lb 12.8 oz (82.918 kg), SpO2 100.00%.  Physical Exam  Nursing note and vitals reviewed. Constitutional: He is oriented to person, place, and time and well-developed, well-nourished, and in no distress. No  distress.  HENT:  Head: Normocephalic and atraumatic.  Mouth/Throat: No oropharyngeal exudate.  Eyes: Conjunctivae are normal. Pupils are equal, round, and reactive to light. No scleral icterus.  Neck: Normal range of motion. Neck supple. No  thyromegaly present.  Cardiovascular: Normal rate, regular rhythm, normal heart sounds and intact distal pulses.  Exam reveals no friction rub.   No murmur heard. Pulmonary/Chest: Effort normal and breath sounds normal. No respiratory distress. He has no wheezes.  Abdominal: Soft. Bowel sounds are normal. He exhibits no distension. There is no tenderness. There is no rebound and no guarding.  Genitourinary:  A fairly large left inguinal hernia is soft and reducible.  Musculoskeletal: Normal range of motion. He exhibits no edema and no tenderness.  Lymphadenopathy:    He has no cervical adenopathy.  Neurological: He is alert and oriented to person, place, and time. Gait normal.  Skin: Skin is warm and dry. No rash noted. No erythema.  Psychiatric: Affect normal.    LABORATORY DATA:. CBC  Lab Results  Component Value Date   WBC 6.4 03/03/2014   RBC 3.49* 03/03/2014   HGB 11.6* 03/03/2014   HCT 34.1* 03/03/2014   PLT 182 03/03/2014   MCV 97.7 03/03/2014   MCH 33.2 03/03/2014   MCHC 34.0 03/03/2014   RDW 16.8* 03/03/2014   LYMPHSABS 1.0 03/03/2014   MONOABS 0.8 03/03/2014   EOSABS 0.0 03/03/2014   BASOSABS 0.0 03/03/2014     CMET  Lab Results  Component Value Date   NA 132* 03/03/2014   K 4.3 03/03/2014   CL 96 04/15/2013   CO2 22 03/03/2014   GLUCOSE 167* 03/03/2014   BUN 17.4 03/03/2014   CREATININE 1.5* 03/03/2014   CALCIUM 9.1 03/03/2014   PROT 6.6 03/03/2014   ALBUMIN 3.5 03/03/2014   AST 21 03/03/2014   ALT 19 03/03/2014   ALKPHOS 56 03/03/2014   BILITOT 0.44 03/03/2014   GFRNONAA 53* 04/15/2013   GFRAA 61* 04/15/2013   ASSESSMENT/PLAN:    Non-small cell carcinoma of lung  Assessment & Plan Patient appears to be tolerating his  chemotherapy fairly well.  Reviewed all lab results with both patient and his wife in detail today.  Patient will proceed today with cycle 6 of his Alimta chemotherapy regimen.  He has plans to receive his next cycle of chemotherapy on 03/17/2014.  He has restaging scan scheduled for 03/22/2014.   Chronic renal insufficiency  Assessment & Plan Patient does suffer with chronic renal insufficiency; and his creatinine remained stable at present.   Inguinal hernia  Assessment & Plan Patient does have a fairly large left inguinal hernia that is chronic for him.  He is able to reduce the hernia with no difficulty whatsoever.  Patient will be referred to Gen. surgery for further evaluation of his hernia.   Patient stated understanding of all instructions; and was in agreement with this plan of care. The patient knows to call the clinic with any problems, questions or concerns.   This was a shared visit with Dr. Julien Nordmann today.  Total time spent with patient was 40 minutes;  with greater than 75 percent of that time spent in face to face counseling regarding his symptoms, and coordination of care and follow up.  Disclaimer: This note was dictated with voice recognition software. Similar sounding words can inadvertently be transcribed and may not be corrected upon review.   Drue Second, NP 03/10/2014   ADDENDUM: Hematology/Oncology Attending: I had a face to face encounter with the patient. I recommended his care plan. This is a very pleasant 80 years old white male with metastatic non-small cell lung cancer, adenocarcinoma. He is currently undergoing systemic chemotherapy with single agent Alimta therapy, status post 5 cycles. He  is tolerating his treatment well except for the fatigue for a few days after the treatment. He still complaining of large left inguinal hernia. I recommended for him to see Dr. Zella Richer for evaluation of this issue. I advise him to continue his current treatment  with Alimta as scheduled. The patient would come back for followup visit in 3 weeks after repeating CT scan of the chest, abdomen and pelvis for restaging of his disease. He was advised to call immediately she has any concerning symptoms in the interval.  Disclaimer: This note was dictated with voice recognition software. Similar sounding words can inadvertently be transcribed and may be missed upon review. Eilleen Kempf., MD 03/10/2014

## 2014-03-11 ENCOUNTER — Encounter (HOSPITAL_COMMUNITY)
Admission: RE | Admit: 2014-03-11 | Discharge: 2014-03-11 | Disposition: A | Discharge status: Home / Self Care | Payer: Medicare Other | Source: Ambulatory Visit | Attending: Pulmonary Disease | Admitting: Pulmonary Disease

## 2014-03-11 DIAGNOSIS — R0609 Other forms of dyspnea: Secondary | ICD-10-CM | POA: Diagnosis not present

## 2014-03-11 DIAGNOSIS — R0989 Other specified symptoms and signs involving the circulatory and respiratory systems: Secondary | ICD-10-CM | POA: Diagnosis not present

## 2014-03-11 NOTE — Progress Notes (Signed)
Today, Tyler [name] exercised at [employer]. Service time was from 1:30pm to 3:15pm.  The patient exercised for more than 31 minutes performing aerobic, strengthening, and stretching exercises. Oxygen saturation, heart rate, blood pressure, rate of perceived exertion, and shortness of breath were all monitored before, during, and after exercise. Tyler [name] presented with no problems at today's exercise session. The patient attended education class with Rosebud Poles on "Warning Signs and Symptoms".   There was an increase in workload change during today's exercise session.  Pre-exercise vitals:   Weight kg: 81.8   Liters of O2: ra   SpO2: 96   HR: 87   BP: 116/78   CBG: na  Exercise vitals:   Highest heartrate:  96   Lowest oxygen saturation: 96   Highest blood pressure: 112/60   Liters of 02: ra  Post-exercise vitals:   SpO2: 98   HR: 82   BP: 98/60   Liters of O2: ra   CBG: na  Dr. Brand Males, Medical Director Dr. Dyann Kief is immediately available during today's Pulmonary Rehab session for Tyler [name] on 03/11/14 at 1:30pm class time.

## 2014-03-15 ENCOUNTER — Other Ambulatory Visit: Payer: Self-pay | Admitting: Medical Oncology

## 2014-03-15 NOTE — Progress Notes (Unsigned)
Tyler [name] calling because pt has no f/u after scan. Note to Britt.

## 2014-03-16 ENCOUNTER — Telehealth: Payer: Self-pay | Admitting: Medical Oncology

## 2014-03-16 ENCOUNTER — Encounter (HOSPITAL_COMMUNITY): Payer: Medicare Other

## 2014-03-16 ENCOUNTER — Encounter: Payer: Self-pay | Admitting: *Deleted

## 2014-03-16 NOTE — Progress Notes (Signed)
[name] CLINICAL SOCIAL WORK PSYCHOSOCIAL ASSESSMENT  Date:  03/16/2014  First Name: Tyler [name]   M.I.: Z     Last Name: [name] MRN:  [id number]  The patient was referred by patient's daughter to assess for psychosocial, emotional, spiritual, and practical needs.   CSW talked with patient's daughter by phone and contacted patient by phone.  Primary Cancer Type: Non-small cell carcinoma of lung   Primary site: Lung (Left)   Staging method: AJCC 7th Edition   Clinical free text: Negative EGFR mutation and negative ALK gene translocation   Clinical: Stage IV (T1b, N3, M1b) signed by Tyler Bears, MD on 10/09/2013 10:23 AM   Summary: Stage IV (T1b, N3, M1b)  Practical Problems: None Identified    Employment:  retired  Scientist, physiological of Income: [place]: BCBS Medicare  Family Problems: yes  None Identified Concerns caring for family needs: None Identified    Emotional Problems: yes  Concerns of Adjustment to Diagnosis/Treatment: yes   Current Symptoms of Anxiety: No symptoms identified   Current Symptoms of Depression: yes  Safety/Risk Concerns: None identified   Mental Status Exam Orientation: person, place, and time Affect: depressed Thought: normal      Spiritual/Religious: yes  Living Situation: with partner / significant other  Functional Status: Independent and Supervision                                                                                              Strengths and Barriers To Treatment:   Patient Coping Strengths:   Supportive Relationships, Family, Friends and Church                                                                                                Identified Problems/Needs and Barriers to Care:  Adjustment to Illness, Emotional/Behavioral/Cognitive and Coping/Communication  Counseling and Social Work Interventions and Recommendations:  Assessment, Referral to Mental Health  Resource  Impressions/Plan: Patient's daughter reports her father is "really struggling" through his cancer treatment.  Patient recently attended our last Living with Advanced Cancer class ran by Tyler [name] and myself.  Tyler [name] was accompanied by his girlfriend Tyler [name] who is very supportive.  CSW contacted patient by phone, he shared he "does not feel like himself".  The patient was unable to articulate how he is currently feeling but agrees that he "feels down".  Tyler [name] shared he was initially very positive when he first began treatment, but currently feels hopeless.  CSW and patient discussed signs of depression and CSW encouraged patient to receive counseling.  Tyler [name] was agreeable.  CSW contacted Tyler [name] at [facility] and made referral for individual counseling.  Patient/family plan to contact CSW to follow  up and provide update on patient's condition.  Tyler [name], MSW, LCSW, OSW-C Clinical Social Worker [facility] [phone]

## 2014-03-16 NOTE — Telephone Encounter (Signed)
I called Tyler [name] and told her we are trying to schedule f/u with [facility] on 03/24/14.

## 2014-03-17 ENCOUNTER — Telehealth: Payer: Self-pay | Admitting: *Deleted

## 2014-03-17 ENCOUNTER — Ambulatory Visit: Payer: Medicare Other

## 2014-03-17 ENCOUNTER — Other Ambulatory Visit: Payer: Self-pay | Admitting: Medical Oncology

## 2014-03-17 ENCOUNTER — Other Ambulatory Visit: Payer: Medicare Other

## 2014-03-17 ENCOUNTER — Telehealth: Payer: Self-pay | Admitting: Internal Medicine

## 2014-03-17 NOTE — Telephone Encounter (Signed)
Last entry on wrong pt

## 2014-03-17 NOTE — Telephone Encounter (Signed)
I have adjusted atppt on 10/7 to first available

## 2014-03-17 NOTE — Telephone Encounter (Signed)
Per patient request I have moved appts

## 2014-03-17 NOTE — Telephone Encounter (Signed)
, °

## 2014-03-18 ENCOUNTER — Ambulatory Visit (INDEPENDENT_AMBULATORY_CARE_PROVIDER_SITE_OTHER): Payer: No Typology Code available for payment source | Admitting: Psychology

## 2014-03-18 ENCOUNTER — Encounter (HOSPITAL_COMMUNITY)
Admission: RE | Admit: 2014-03-18 | Discharge: 2014-03-18 | Disposition: A | Discharge status: Home / Self Care | Payer: Medicare Other | Source: Ambulatory Visit | Attending: Pulmonary Disease | Admitting: Pulmonary Disease

## 2014-03-18 DIAGNOSIS — C3490 Malignant neoplasm of unspecified part of unspecified bronchus or lung: Secondary | ICD-10-CM | POA: Insufficient documentation

## 2014-03-18 DIAGNOSIS — F4323 Adjustment disorder with mixed anxiety and depressed mood: Secondary | ICD-10-CM

## 2014-03-18 DIAGNOSIS — Z5189 Encounter for other specified aftercare: Secondary | ICD-10-CM | POA: Insufficient documentation

## 2014-03-18 NOTE — Progress Notes (Signed)
Today, Tyler [name] exercised at [employer]. [facility]. Service time was from 12:30pm to 3:00pm.  The patient exercised for more than 31 minutes performing aerobic, strengthening, and stretching exercises. Oxygen saturation, heart rate, blood pressure, rate of perceived exertion, and shortness of breath were all monitored before, during, and after exercise. Tyler [name] presented with no problems at today's exercise session.  The patient attended MD Lecture day with Dr. Nelda Marseille.  There was an increase in workload change during today's exercise session.  Pre-exercise vitals:   Weight kg: 80.8   Liters of O2: ra   SpO2: 100   HR: 82   BP: 128/74   CBG: na  Exercise vitals:   Highest heartrate:  83   Lowest oxygen saturation: 97   Highest blood pressure: 130/60   Liters of 02: ra  Post-exercise vitals:   SpO2: 98   HR: 80   BP: 104/76   Liters of O2: ra   CBG: na  Dr. Brand Males, Medical Director Dr. Aileen Fass is immediately available during today's Pulmonary Rehab session for Tyler [name] on 03/18/14 at 1:30pm class time.

## 2014-03-19 ENCOUNTER — Encounter: Payer: Self-pay | Admitting: Emergency Medicine

## 2014-03-19 ENCOUNTER — Ambulatory Visit (INDEPENDENT_AMBULATORY_CARE_PROVIDER_SITE_OTHER): Payer: Medicare Other | Admitting: Emergency Medicine

## 2014-03-19 VITALS — BP 142/86 | HR 82 | Temp 97.9°F | Ht 69.0 in | Wt 192.8 lb

## 2014-03-19 DIAGNOSIS — J449 Chronic obstructive pulmonary disease, unspecified: Secondary | ICD-10-CM

## 2014-03-19 NOTE — Assessment & Plan Note (Signed)
He is likely experiencing the effects of chemotherapy - malaise, weakness. He does have some DOE. He isn't doing Duonebs.  - will do a trial of anoro, see if this is simpler for him to use. If so then he can talk to Dr Lake Bells about staying on it.

## 2014-03-19 NOTE — Patient Instructions (Signed)
Please stop your Duonebs for now Start Anoro one inhalation daily. Use albuterol respiclick if needed for shortness of breath Follow with Dr Lake Bells in 1 month to assess whether the new medication has been beneficial.

## 2014-03-19 NOTE — Progress Notes (Signed)
HPI :  80 yo man followed by Dr Lake Bells for moderate COPD, hx of stage 4 adenoCA of the LUL. Most recent CT scan 01/08/14 with some increase in size of pulm nodules, no abdominal disease evident. He has received chemo/XRT in past, now receiving a 2nd course (has completed 5 of 6 with plans to reassess).   He presents today c/o progressive dyspnea over about a week. He has been coughing more for 2 days, particularly at night. Productive of pale brownish phlegm. His chest feels tight even at rest. He is on combivent prn, but rarely uses it. He hasn't tried it in the last few days - he is skeptical that it helps him. Exertional tolerance is down. No fevers. He has had sweats for a week, chills for a week. He has been taking his decadron every 2-3 days (not as its prescribed).   Acute OV 03/19/14 -- Tyler [name] presents today at his wife's request > she sees more difficulty breathing over about the last 2 weeks. He has decreased energy level. Has has continued to get chemo, last was. He continues to go to pulm rehab. He is using duonebs, but doesn't take 3x a day  - takes too long and tires him. Very little cough, no wheeze.    Past Medical History  Diagnosis Date  . BENIGN PROSTATIC HYPERTROPHY 07/23/2008  . 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 HIP PAIN, LEFT 07/04/2007  . HYPERLIPIDEMIA 01/02/2007  . HYPERTENSION 01/02/2007  . INSOMNIA 11/16/2008  . PROSTATE SPECIFIC ANTIGEN, ELEVATED 09/10/2008  . AAA (abdominal aortic aneurysm)   . Dysrhythmia     afib  . Shortness of breath   . COPD (chronic obstructive pulmonary disease)   . GERD (gastroesophageal reflux disease)   . Anemia   . History of blood transfusion   . Lung cancer dx'd 11/2012    metastatic non small cell lung cancer, adenocarcinoma      Family History  Problem Relation Age of Onset  . Colon polyps Neg Hx   . Stomach cancer Neg Hx   . Colon cancer Neg Hx   . Cancer Neg Hx      History   Social History  . Marital Status:  Widowed    Spouse Name: N/A    Number of Children: N/A  . Years of Education: N/A   Occupational History  . Retired Software engineer    Social History Main Topics  . Smoking status: Former Smoker -- 1.00 packs/day for 40 years    Types: Cigarettes    Quit date: 06/18/1998  . Smokeless tobacco: Never Used  . Alcohol Use: No  . Drug Use: No  . Sexual Activity: No   Other Topics Concern  . Not on file   Social History Narrative  . No narrative on file     No Known Allergies   Outpatient Prescriptions Prior to Visit  Medication Sig Dispense Refill  . acetaminophen (TYLENOL) 325 MG tablet Take 650 mg by mouth every 6 (six) hours as needed.      . Albuterol Sulfate (PROAIR RESPICLICK) 242 (90 BASE) MCG/ACT AEPB Inhale 1 puff into the lungs every 6 (six) hours as needed (shortness of breath).  1 each  5  . cholecalciferol (VITAMIN D) 1000 UNITS tablet Take 1,000 Units by mouth every morning.       Marland Kitchen dexamethasone (DECADRON) 4 MG tablet TAKE 1 TAB BY MOUTH TWICE DAILY THE DAY BEFORE, THE DAY OF, & THE DAY AFTER CHEMOTHERAPY EVERY  3 WKS  60 tablet  0  . folic acid (FOLVITE) 1 MG tablet Take 1 tablet (1 mg total) by mouth daily.  30 tablet  2  . guaiFENesin (MUCINEX) 600 MG 12 hr tablet Take 600 mg by mouth 2 (two) times daily.      Marland Kitchen ipratropium-albuterol (DUONEB) 0.5-2.5 (3) MG/3ML SOLN Take 3 mLs by nebulization 3 (three) times daily.  360 mL  2  . multivitamin-iron-minerals-folic acid (CENTRUM) chewable tablet Chew 1 tablet by mouth every morning.       . propranolol (INDERAL) 10 MG tablet Take 1 tablet by mouth 2 (two) times daily.       No facility-administered medications prior to visit.   Filed Vitals:   03/19/14 1528  BP: 142/86  Pulse: 82  Temp: 97.9 F (36.6 C)  TempSrc: Oral  Height: 5\' 9"  (1.753 m)  Weight: 192 lb 12.8 oz (87.454 kg)  SpO2: 97%   Gen: Pleasant, elder man, in no distress,  normal affect  ENT: No lesions,  mouth clear,  oropharynx clear, no postnasal  drip  Neck: No JVD, no TMG, no carotid bruits  Lungs: No use of accessory muscles, distant, clear without rales or rhonchi  Cardiovascular: RRR, heart sounds normal, no murmur or gallops, no peripheral edema  Musculoskeletal: No deformities, no cyanosis or clubbing  Neuro: alert, non focal  Skin: Warm, no lesions or rashes    CT scan chest/abd/pelvis 01/08/14 ---  COMPARISON: CT 10/07/2013  FINDINGS:  CT CHEST FINDINGS  No axillary or supraclavicular lymphadenopathy. Enlarged right lower  paratracheal lymph node measuring 17 mm short axis compared to 16 mm  on prior. Small lymph nodes in the prevascular space are in the 8 mm  in short axis compared to 8 mm on prior. Subcarinal lymph nodes  measures 14 mm compared to 15 mm on prior. No pericardial fluid.  There is mild thickening esophagus throughout its course.  Review of the lung parenchyma again demonstrates multiple bilateral  round pulmonary nodules of varying size consistent with pulmonary  metastasis. Some individual nodules have increased in size.  For example right lower lobe nodule measuring 18 mm (image 41,  series 5) increased from 13 mm on prior.  Two adjacent nodules in the right lower lobe measures 18 and 11 mm  (image 34) compared to 17 mm and 8 mm.  Nodule in the lingula measures 8 mm compared to 8 mm on prior.  Left lower lobe nodule measures 14 mm compared to 10 on prior (image  28, series 5).  CT ABDOMEN AND PELVIS FINDINGS  No focal hepatic lesion. The gallbladder, pancreas, spleen, adrenal  glands, kidneys are unchanged. There are low-density cysts of the  left and right kidneys.  Large left inguinal hernia contains a loop of nonobstructed sigmoid  colon.  Abdominal aorta is aneurysmal with a graft repair. No evidence of  endoleak. No retroperitoneal periportal lymphadenopathy.  No free fluid the pelvis. There is bilateral hip prosthetics.  IMPRESSION:  Chest Impression:  1. While the overall pattern  of the pulmonary metastasis is similar,  several individual lesions have increased in size.  2. Stable mediastinal lymphadenopathy.  Abdomen / Pelvis Impression  1. No evidence of metastatic disease the abdomen pelvis.  2. Stable bilateral low-density renal lesions  3. Large left inguinal hernia contains a loop of not strangulated  large bowel.    COPD, severe He is likely experiencing the effects of chemotherapy - malaise, weakness. He does have some DOE.  He isn't doing Duonebs.  - will do a trial of anoro, see if this is simpler for him to use. If so then he can talk to Dr Lake Bells about staying on it.

## 2014-03-22 ENCOUNTER — Ambulatory Visit (HOSPITAL_COMMUNITY)
Admission: RE | Admit: 2014-03-22 | Discharge: 2014-03-22 | Disposition: A | Discharge status: Home / Self Care | Payer: Medicare Other | Source: Ambulatory Visit | Attending: Nurse Practitioner | Admitting: Nurse Practitioner

## 2014-03-22 ENCOUNTER — Other Ambulatory Visit: Payer: Self-pay | Admitting: Nurse Practitioner

## 2014-03-22 ENCOUNTER — Ambulatory Visit (HOSPITAL_COMMUNITY): Payer: Medicare Other

## 2014-03-22 ENCOUNTER — Encounter (HOSPITAL_COMMUNITY): Payer: Self-pay

## 2014-03-22 DIAGNOSIS — C3412 Malignant neoplasm of upper lobe, left bronchus or lung: Secondary | ICD-10-CM | POA: Insufficient documentation

## 2014-03-22 DIAGNOSIS — C3490 Malignant neoplasm of unspecified part of unspecified bronchus or lung: Secondary | ICD-10-CM

## 2014-03-22 MED ORDER — IOHEXOL 300 MG/ML  SOLN
100.0000 mL | Freq: Once | INTRAMUSCULAR | Status: AC | PRN
  Administered 2014-03-22: 100 mL via INTRAVENOUS

## 2014-03-23 ENCOUNTER — Other Ambulatory Visit: Payer: Self-pay | Admitting: Medical Oncology

## 2014-03-23 ENCOUNTER — Encounter (HOSPITAL_COMMUNITY): Payer: Medicare Other

## 2014-03-23 ENCOUNTER — Telehealth (HOSPITAL_COMMUNITY): Payer: Self-pay | Admitting: *Deleted

## 2014-03-23 DIAGNOSIS — C3490 Malignant neoplasm of unspecified part of unspecified bronchus or lung: Secondary | ICD-10-CM

## 2014-03-24 ENCOUNTER — Other Ambulatory Visit: Payer: Medicare Other

## 2014-03-24 ENCOUNTER — Other Ambulatory Visit (HOSPITAL_BASED_OUTPATIENT_CLINIC_OR_DEPARTMENT_OTHER): Payer: Medicare Other

## 2014-03-24 ENCOUNTER — Telehealth: Payer: Self-pay | Admitting: Internal Medicine

## 2014-03-24 ENCOUNTER — Encounter: Payer: Self-pay | Admitting: Internal Medicine

## 2014-03-24 ENCOUNTER — Ambulatory Visit: Payer: Medicare Other

## 2014-03-24 ENCOUNTER — Ambulatory Visit (HOSPITAL_COMMUNITY): Payer: Medicare Other

## 2014-03-24 ENCOUNTER — Ambulatory Visit (HOSPITAL_BASED_OUTPATIENT_CLINIC_OR_DEPARTMENT_OTHER): Payer: Medicare Other | Admitting: Internal Medicine

## 2014-03-24 VITALS — BP 125/81 | HR 80 | Temp 97.9°F | Resp 17 | Ht 69.0 in | Wt 179.1 lb

## 2014-03-24 DIAGNOSIS — C3492 Malignant neoplasm of unspecified part of left bronchus or lung: Secondary | ICD-10-CM

## 2014-03-24 DIAGNOSIS — K469 Unspecified abdominal hernia without obstruction or gangrene: Secondary | ICD-10-CM

## 2014-03-24 DIAGNOSIS — R410 Disorientation, unspecified: Secondary | ICD-10-CM

## 2014-03-24 DIAGNOSIS — R5383 Other fatigue: Secondary | ICD-10-CM

## 2014-03-24 DIAGNOSIS — C3490 Malignant neoplasm of unspecified part of unspecified bronchus or lung: Secondary | ICD-10-CM

## 2014-03-24 LAB — CBC WITH DIFFERENTIAL/PLATELET
BASO%: 0 % (ref 0.0–2.0)
Basophils Absolute: 0 10*3/uL (ref 0.0–0.1)
EOS ABS: 0 10*3/uL (ref 0.0–0.5)
EOS%: 0.1 % (ref 0.0–7.0)
HEMATOCRIT: 32.4 % — AB (ref 38.4–49.9)
HGB: 11.2 g/dL — ABNORMAL LOW (ref 13.0–17.1)
LYMPH#: 1 10*3/uL (ref 0.9–3.3)
LYMPH%: 12.1 % — AB (ref 14.0–49.0)
MCH: 33.6 pg — ABNORMAL HIGH (ref 27.2–33.4)
MCHC: 34.6 g/dL (ref 32.0–36.0)
MCV: 97.3 fL (ref 79.3–98.0)
MONO#: 0.8 10*3/uL (ref 0.1–0.9)
MONO%: 10.3 % (ref 0.0–14.0)
NEUT#: 6.3 10*3/uL (ref 1.5–6.5)
NEUT%: 77.5 % — ABNORMAL HIGH (ref 39.0–75.0)
PLATELETS: 252 10*3/uL (ref 140–400)
RBC: 3.33 10*6/uL — ABNORMAL LOW (ref 4.20–5.82)
RDW: 15.4 % — ABNORMAL HIGH (ref 11.0–14.6)
WBC: 8.1 10*3/uL (ref 4.0–10.3)

## 2014-03-24 LAB — COMPREHENSIVE METABOLIC PANEL (CC13)
ALBUMIN: 3.6 g/dL (ref 3.5–5.0)
ALT: 22 U/L (ref 0–55)
ANION GAP: 10 meq/L (ref 3–11)
AST: 18 U/L (ref 5–34)
Alkaline Phosphatase: 46 U/L (ref 40–150)
BILIRUBIN TOTAL: 0.49 mg/dL (ref 0.20–1.20)
BUN: 22.8 mg/dL (ref 7.0–26.0)
CALCIUM: 9.5 mg/dL (ref 8.4–10.4)
CHLORIDE: 97 meq/L — AB (ref 98–109)
CO2: 23 meq/L (ref 22–29)
Creatinine: 1.5 mg/dL — ABNORMAL HIGH (ref 0.7–1.3)
GLUCOSE: 171 mg/dL — AB (ref 70–140)
Potassium: 4.3 mEq/L (ref 3.5–5.1)
SODIUM: 130 meq/L — AB (ref 136–145)
TOTAL PROTEIN: 6.4 g/dL (ref 6.4–8.3)

## 2014-03-24 NOTE — Telephone Encounter (Signed)
gv adn printed appt sched and avs fo rpt for OCT.Marland KitchenMarland Kitchen

## 2014-03-24 NOTE — Progress Notes (Signed)
[facility] [phone]   [phone]  OFFICE PROGRESS NOTE  [name], MD [address] [place] [address]  DIAGNOSIS AND STAGE: Metastatic non-small cell lung cancer, adenocarcinoma presented with left upper lobe lung mass in addition to extensive supraclavicular, mediastinal adenopathy and bilateral pulmonary nodules diagnosed in July of 2014. EGFR mutation as well as ALK gene translocation are negative.   PRIOR THERAPY:  1) Palliative radiotherapy to the enlarged mediastinal and supraclavicular lymph nodes under the care of Dr. Tammi Klippel.  2) Systemic chemotherapy with carboplatin for AUC of 5 and Alimta 500 mg/M2 giving every 3 weeks. First dose given on 01/21/2013. Status post 6 cycles.   CURRENT THERAPY: Systemic chemotherapy with single agent Alimta 400 mg/m2 every 3 weeks. Status post 6 cycles.  CHEMOTHERAPY INTENT: Palliative  CURRENT # OF CHEMOTHERAPY CYCLES: 7 CURRENT ANTIEMETICS: Zofran, dexamethasone and Compazine  CURRENT SMOKING STATUS: currently a nonsmoker  ORAL CHEMOTHERAPY AND CONSENT: None  CURRENT BISPHOSPHONATES USE: None  PAIN MANAGEMENT: No pain  NARCOTICS INDUCED CONSTIPATION: N/A  LIVING WILL AND CODE STATUS: Full code   INTERVAL HISTORY: Tyler [name] 80 y.o. male returns to the clinic today for followup visit accompanied by his daughter and girlfriend, Tyler [name]. He completed 6 cycles of maintenance chemotherapy with single agent Alimta and tolerated the treatment fairly well except for the last cycle he has been complaining of increasing fatigue and weakness. His daughter and girlfriend noticed that the patient also has moments of confusion and memory loss. He sleeps a lot recently. He is not very active as before and doesn't want to do much. He continues to complain of the hernia that has been there for 7 years but he is scheduled to see Dr. Zella Richer tomorrow for evaluation. He also has  shortness of breath with exertion but no significant chest pain, cough or hemoptysis. He denied having any significant fever or chills, no nausea or vomiting. He had repeat CT scan of the chest, abdomen and pelvis performed recently and he is here for evaluation and discussion of his scan results.   MEDICAL HISTORY: Past Medical History  Diagnosis Date  . BENIGN PROSTATIC HYPERTROPHY 07/23/2008  . 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 HIP PAIN, LEFT 07/04/2007  . HYPERLIPIDEMIA 01/02/2007  . HYPERTENSION 01/02/2007  . INSOMNIA 11/16/2008  . PROSTATE SPECIFIC ANTIGEN, ELEVATED 09/10/2008  . AAA (abdominal aortic aneurysm)   . Dysrhythmia     afib  . Shortness of breath   . COPD (chronic obstructive pulmonary disease)   . GERD (gastroesophageal reflux disease)   . Anemia   . History of blood transfusion   . Lung cancer dx'd 11/2012    metastatic non small cell lung cancer, adenocarcinoma     ALLERGIES:  has No Known Allergies.  MEDICATIONS:  Current Outpatient Prescriptions  Medication Sig Dispense Refill  . acetaminophen (TYLENOL) 325 MG tablet Take 650 mg by mouth every 6 (six) hours as needed.      . cholecalciferol (VITAMIN D) 1000 UNITS tablet Take 1,000 Units by mouth every morning.       Marland Kitchen dexamethasone (DECADRON) 4 MG tablet TAKE 1 TAB BY MOUTH TWICE DAILY THE DAY BEFORE, THE DAY OF, & THE DAY AFTER CHEMOTHERAPY EVERY 3 WKS  60 tablet  0  . folic acid (FOLVITE) 1 MG tablet Take 1 tablet (1 mg total) by mouth daily.  30 tablet  2  . guaiFENesin (MUCINEX) 600  MG 12 hr tablet Take 600 mg by mouth 2 (two) times daily.      . multivitamin-iron-minerals-folic acid (CENTRUM) chewable tablet Chew 1 tablet by mouth every morning.       . propranolol (INDERAL) 10 MG tablet Take 1 tablet by mouth 2 (two) times daily.      Marland Kitchen Umeclidinium-Vilanterol (ANORO ELLIPTA) 62.5-25 MCG/INH AEPB Inhale 1 puff into the lungs daily.       No current facility-administered medications for this visit.     SURGICAL HISTORY:  Past Surgical History  Procedure Laterality Date  . Transurethral resection of prostate  2004  . Total hip arthroplasty  2010, 2011    2010- left; 2011- right  . Upper gastrointestinal endoscopy    . Abdominal aortic endovascular stent graft N/A 04/14/2013    Procedure: ABDOMINAL AORTIC ENDOVASCULAR STENT GRAFT;  Surgeon: Conrad Esko, MD;  Location: [facility] OR;  Service: Vascular;  Laterality: N/A;    REVIEW OF SYSTEMS:  Constitutional: positive for anorexia and fatigue Eyes: negative Ears, nose, mouth, throat, and face: positive for hoarseness.  Respiratory: positive for cough and dyspnea on exertion Cardiovascular: negative Gastrointestinal: negative Genitourinary:negative Integument/breast: negative Hematologic/lymphatic: negative Musculoskeletal: negative. Neurological: negative Behavioral/Psych: positive for anxiety, depression and fatigue Endocrine: negative Allergic/Immunologic: negative   PHYSICAL EXAMINATION: General appearance: alert, cooperative, fatigued and no distress Head: Normocephalic, without obvious abnormality, atraumatic Neck: no adenopathy, no JVD, supple, symmetrical, trachea midline and thyroid not enlarged, symmetric, no tenderness/mass/nodules Lymph nodes: Cervical, supraclavicular, and axillary nodes normal. Resp: clear to auscultation bilaterally Back: symmetric, no curvature. ROM normal. No CVA tenderness. Cardio: regular rate and rhythm, S1, S2 normal, no murmur, click, rub or gallop GI: soft, non-tender; bowel sounds normal; no masses,  no organomegaly Extremities: extremities normal, atraumatic, no cyanosis or edema Neurologic: Alert and oriented X 3, normal strength and tone. Normal symmetric reflexes. Normal coordination and gait   ECOG PERFORMANCE STATUS: 2 - Symptomatic, <50% confined to bed  Blood pressure 125/81, pulse 80, temperature 97.9 F (36.6 C), temperature source Oral, resp. rate 17, height 5' 9" (1.753 m),  weight 179 lb 1.6 oz (81.239 kg), SpO2 98.00%.  LABORATORY DATA: Lab Results  Component Value Date   WBC 8.1 03/24/2014   HGB 11.2* 03/24/2014   HCT 32.4* 03/24/2014   MCV 97.3 03/24/2014   PLT 252 03/24/2014      Chemistry      Component Value Date/Time   NA 130* 03/24/2014 1042   NA 131* 04/15/2013 0500   K 4.3 03/24/2014 1042   K 4.1 04/15/2013 0500   CL 96 04/15/2013 0500   CO2 23 03/24/2014 1042   CO2 22 04/15/2013 0500   BUN 22.8 03/24/2014 1042   BUN 17 04/15/2013 0500   CREATININE 1.5* 03/24/2014 1042   CREATININE 1.27 04/15/2013 0500      Component Value Date/Time   CALCIUM 9.5 03/24/2014 1042   CALCIUM 8.4 04/15/2013 0500   ALKPHOS 46 03/24/2014 1042   ALKPHOS 42 04/15/2013 0500   AST 18 03/24/2014 1042   AST 15 04/15/2013 0500   ALT 22 03/24/2014 1042   ALT 9 04/15/2013 0500   BILITOT 0.49 03/24/2014 1042   BILITOT 0.5 04/15/2013 0500       RADIOGRAPHIC STUDIES: Ct Chest W Contrast  03/22/2014   CLINICAL DATA:  Followup of left upper lobe non-small-cell lung carcinoma. Restaging study.  EXAM: CT CHEST, ABDOMEN, AND PELVIS WITH CONTRAST  TECHNIQUE: Multidetector CT imaging of the chest, abdomen and pelvis  was performed following the standard protocol during bolus administration of intravenous contrast.  CONTRAST:  161m OMNIPAQUE IOHEXOL 300 MG/ML  SOLN  COMPARISON:  01/08/2014  FINDINGS: CT CHEST FINDINGS  Since the prior study, there has been worsening of metastatic disease to the lungs. Numerous bilateral pulmonary nodules are noted many of which have increased in size. There are no convincing new pulmonary nodules, however. Several reference measurements were made. Largest nodule lies in the right lower lobe currently measuring 2 cm transversely where it had measured 1.8 cm. A cavitary nodule seen adjacent to this in the right lower lobe is less well-defined than on the prior study, but is mildly larger also measuring 2 cm where it had measured 1.8 cm. There is an  anteromedial right middle lobe nodule currently measuring 13 mm where it had measured 10 mm a superior segment pulmonary nodule measures 10 mm where it had measures 7 mm. There is a left lower lobe pulmonary nodule currently measuring 8.5 mm where it had measures 7.5 mm. Inferior to this a left lower lobe pulmonary nodule measures 12.6 mm where it had measured 11.8 mm. Near the post left lung base There is a nodule measuring 12.6 mm where it had measured 13.8 mm.  No pleural effusions.  Mild increase in size of a single mediastinal lymph node. Right subcarinal lymph node measures 2 cm in short axis where it had measured 1.4 cm. Other prominent mediastinal nodes noted previously are stable. No neck base or axillary masses or adenopathy.  Heart is normal in size. There are dense coronary artery calcifications. Great vessels are normal in caliber.  CT ABDOMEN AND PELVIS FINDINGS  No liver mass or focal lesion.  Normal spleen, gallbladder and pancreas.  Thickening versus a small nodule from the anterior limb of the left adrenal gland measuring 11 mm. This is unchanged. This previous measures 7 mm in thickness. Normal right adrenal gland.  Bilateral low-density renal masses consistent with cysts. Bilateral renal cortical thinning. No hydronephrosis. Normal ureters and bladder.  Prostate gland is enlarged but partly obscured by bilateral hip prosthesis artifact.  There is several prominent periceliac and retroperitoneal lymph nodes. The largest is a left periaortic node measuring 1 cm in short axis. This node previously measured 7 mm.  No abnormal fluid collections.  There are scattered left colon diverticula. No diverticulitis. A portion of the sigmoid colon enters a left inguinal hernia without obstruction, strangulation or incarceration. This is stable. Colon otherwise unremarkable. Normal small bowel.  6.7 cm x 6 cm infrarenal abdominal aortic aneurysm has been excluded with a stent graft. This is stable.   MUSCULOSKELETAL:  No osteoblastic or osteolytic lesions. Diffuse bony demineralization. Degenerative changes noted throughout the visualized spine. Bilateral hip prostheses are stable in well positioned.  IMPRESSION: 1. Mild worsening of metastatic lung carcinoma since the prior study. This is reflected by enlargement of several of the multiple bilateral pulmonary nodules. No convincing new pulmonary nodule. 2. Single right subcarinal lymph node has mildly increased in size from prior study, currently 2 cm in short axis where it had measured 1.4 cm. 3. No other evidence of metastatic disease in the chest. 4. Nodular type enlargement of the anterior limb of the left adrenal gland, measuring 11 mm on the current exam and 7 mm previously. This is equivocal. It could reflect nodular hyperplasia or metastatic disease. 5. No other evidence of metastatic disease below the diaphragm. No other change from the prior study. 6. No evidence of skeletal metastatic  disease.   Electronically Signed   By: Lajean Manes M.D.   On: 03/22/2014 12:28   Ct Abdomen Pelvis W Contrast  03/22/2014   CLINICAL DATA:  Followup of left upper lobe non-small-cell lung carcinoma. Restaging study.  EXAM: CT CHEST, ABDOMEN, AND PELVIS WITH CONTRAST  TECHNIQUE: Multidetector CT imaging of the chest, abdomen and pelvis was performed following the standard protocol during bolus administration of intravenous contrast.  CONTRAST:  131m OMNIPAQUE IOHEXOL 300 MG/ML  SOLN  COMPARISON:  01/08/2014  FINDINGS: CT CHEST FINDINGS  Since the prior study, there has been worsening of metastatic disease to the lungs. Numerous bilateral pulmonary nodules are noted many of which have increased in size. There are no convincing new pulmonary nodules, however. Several reference measurements were made. Largest nodule lies in the right lower lobe currently measuring 2 cm transversely where it had measured 1.8 cm. A cavitary nodule seen adjacent to this in the right  lower lobe is less well-defined than on the prior study, but is mildly larger also measuring 2 cm where it had measured 1.8 cm. There is an anteromedial right middle lobe nodule currently measuring 13 mm where it had measured 10 mm a superior segment pulmonary nodule measures 10 mm where it had measures 7 mm. There is a left lower lobe pulmonary nodule currently measuring 8.5 mm where it had measures 7.5 mm. Inferior to this a left lower lobe pulmonary nodule measures 12.6 mm where it had measured 11.8 mm. Near the post left lung base There is a nodule measuring 12.6 mm where it had measured 13.8 mm.  No pleural effusions.  Mild increase in size of a single mediastinal lymph node. Right subcarinal lymph node measures 2 cm in short axis where it had measured 1.4 cm. Other prominent mediastinal nodes noted previously are stable. No neck base or axillary masses or adenopathy.  Heart is normal in size. There are dense coronary artery calcifications. Great vessels are normal in caliber.  CT ABDOMEN AND PELVIS FINDINGS  No liver mass or focal lesion.  Normal spleen, gallbladder and pancreas.  Thickening versus a small nodule from the anterior limb of the left adrenal gland measuring 11 mm. This is unchanged. This previous measures 7 mm in thickness. Normal right adrenal gland.  Bilateral low-density renal masses consistent with cysts. Bilateral renal cortical thinning. No hydronephrosis. Normal ureters and bladder.  Prostate gland is enlarged but partly obscured by bilateral hip prosthesis artifact.  There is several prominent periceliac and retroperitoneal lymph nodes. The largest is a left periaortic node measuring 1 cm in short axis. This node previously measured 7 mm.  No abnormal fluid collections.  There are scattered left colon diverticula. No diverticulitis. A portion of the sigmoid colon enters a left inguinal hernia without obstruction, strangulation or incarceration. This is stable. Colon otherwise  unremarkable. Normal small bowel.  6.7 cm x 6 cm infrarenal abdominal aortic aneurysm has been excluded with a stent graft. This is stable.  MUSCULOSKELETAL:  No osteoblastic or osteolytic lesions. Diffuse bony demineralization. Degenerative changes noted throughout the visualized spine. Bilateral hip prostheses are stable in well positioned.  IMPRESSION: 1. Mild worsening of metastatic lung carcinoma since the prior study. This is reflected by enlargement of several of the multiple bilateral pulmonary nodules. No convincing new pulmonary nodule. 2. Single right subcarinal lymph node has mildly increased in size from prior study, currently 2 cm in short axis where it had measured 1.4 cm. 3. No other evidence of metastatic disease  in the chest. 4. Nodular type enlargement of the anterior limb of the left adrenal gland, measuring 11 mm on the current exam and 7 mm previously. This is equivocal. It could reflect nodular hyperplasia or metastatic disease. 5. No other evidence of metastatic disease below the diaphragm. No other change from the prior study. 6. No evidence of skeletal metastatic disease.   Electronically Signed   By: Lajean Manes M.D.   On: 03/22/2014 12:28   ASSESSMENT AND PLAN: This is a very pleasant 81 years old white male with metastatic non-small cell lung cancer, adenocarcinoma with negative EGFR mutation and negative ALK gene translocation currently on systemic chemotherapy with carboplatin and Alimta status post 6 cycles.  He is currently undergoing systemic chemotherapy with reduced dose single agent Alimta 400 mg/M2 every 3 weeks. Status post 6 cycles. He has been tolerating his treatment well except for the last cycle he has more fatigue and weakness. His recent CT scan of the chest, abdomen and pelvis showed mild worsening of his disease with increase in the bilateral pulmonary nodules but in the range of 2-3 mm. I discussed the scan results with the patient and his family. I gave him  several options for management of his condition including continuous treatment with maintenance chemotherapy with single agent Alimta versus treatment with immunotherapy with Nivolumab versus consideration of oral target therapy with Tarceva. I discussed with the patient and his family the adverse effect of these treatments. Mr. Tesar is feeling tired and fatigued and he would like to hold any further treatment for now. He has plan for vacation and cruise starting 04/02/2014. He would like to hold any further treatment until he comes back from his vacation. I will arrange for the patient a followup appointment in 3 weeks for more detailed discussion of his treatment options. For the recent confusion and fatigue, I will order CT scan of the head to rule out brain metastasis. The patient declines to proceed with MRI of the brain. For the abdominal hernia, he is scheduled to see Dr. Zella Richer later this week for consultation and consideration of hernia repair. The patient and his family had a lot of questions today and I answered them completely to their satisfaction. The patient was advised to call immediately if he has any concerning symptoms in the interval.  I spent 35 minutes counseling the patient face to face. The total time spent in the appointment was 45 minutes.  Disclaimer: This note was dictated with voice recognition software. Similar sounding words can inadvertently be transcribed and may not be corrected upon review.

## 2014-03-25 ENCOUNTER — Encounter (HOSPITAL_COMMUNITY): Payer: Medicare Other

## 2014-03-26 ENCOUNTER — Ambulatory Visit (HOSPITAL_COMMUNITY): Payer: Medicare Other

## 2014-03-26 ENCOUNTER — Other Ambulatory Visit: Payer: Self-pay | Admitting: Internal Medicine

## 2014-03-26 ENCOUNTER — Ambulatory Visit (HOSPITAL_COMMUNITY)
Admission: RE | Admit: 2014-03-26 | Discharge: 2014-03-26 | Disposition: A | Discharge status: Home / Self Care | Payer: Medicare Other | Source: Ambulatory Visit | Attending: Internal Medicine | Admitting: Internal Medicine

## 2014-03-26 ENCOUNTER — Telehealth: Payer: Self-pay | Admitting: *Deleted

## 2014-03-26 ENCOUNTER — Encounter: Payer: Self-pay | Admitting: *Deleted

## 2014-03-26 ENCOUNTER — Encounter (HOSPITAL_COMMUNITY): Payer: Self-pay

## 2014-03-26 DIAGNOSIS — R5383 Other fatigue: Secondary | ICD-10-CM | POA: Diagnosis not present

## 2014-03-26 DIAGNOSIS — C3492 Malignant neoplasm of unspecified part of left bronchus or lung: Secondary | ICD-10-CM | POA: Diagnosis not present

## 2014-03-26 DIAGNOSIS — F411 Generalized anxiety disorder: Secondary | ICD-10-CM

## 2014-03-26 MED ORDER — ALPRAZOLAM 0.25 MG PO TABS: 0.2500 mg | ORAL_TABLET | ORAL | Status: DC

## 2014-03-26 MED ORDER — IOHEXOL 300 MG/ML  SOLN
80.0000 mL | Freq: Once | INTRAMUSCULAR | Status: AC | PRN
  Administered 2014-03-26: 80 mL via INTRAVENOUS

## 2014-03-26 NOTE — Telephone Encounter (Signed)
[name] called stating that pt is very anxious about the CT of the head and is claustrophobic.  Per Dr Vista Mink, okay to give 0.25 xanax x 1.  She states that pt is very fatigued and they are not going to go on their cruise.  Informed Dr Vista Mink.

## 2014-03-26 NOTE — Telephone Encounter (Signed)
Office note dated 03/25/14 from [facility] [place] [facility] given to Dr Vista Mink to review.

## 2014-03-29 ENCOUNTER — Telehealth: Payer: Self-pay | Admitting: *Deleted

## 2014-03-29 NOTE — Telephone Encounter (Signed)
[name] called left msg wanting to know the results of pt's CT of the head.  Per dr Vista Mink, it was negative, will discuss treatment options at f/u appt on 10/26.  Called Natalie back, no answer, left msg.

## 2014-03-30 ENCOUNTER — Encounter (HOSPITAL_BASED_OUTPATIENT_CLINIC_OR_DEPARTMENT_OTHER): Payer: Self-pay | Admitting: Emergency Medicine

## 2014-03-30 ENCOUNTER — Emergency Department (HOSPITAL_BASED_OUTPATIENT_CLINIC_OR_DEPARTMENT_OTHER)
Admission: EM | Admit: 2014-03-30 | Discharge: 2014-03-30 | Disposition: A | Discharge status: Home / Self Care | Payer: Medicare Other | Attending: Emergency Medicine | Admitting: Emergency Medicine

## 2014-03-30 ENCOUNTER — Encounter (HOSPITAL_COMMUNITY): Admission: RE | Admit: 2014-03-30 | Payer: Medicare Other | Source: Ambulatory Visit

## 2014-03-30 ENCOUNTER — Emergency Department (HOSPITAL_BASED_OUTPATIENT_CLINIC_OR_DEPARTMENT_OTHER): Payer: Medicare Other

## 2014-03-30 ENCOUNTER — Encounter: Payer: Self-pay | Admitting: *Deleted

## 2014-03-30 ENCOUNTER — Telehealth: Payer: Self-pay | Admitting: Medical Oncology

## 2014-03-30 DIAGNOSIS — J449 Chronic obstructive pulmonary disease, unspecified: Secondary | ICD-10-CM | POA: Insufficient documentation

## 2014-03-30 DIAGNOSIS — D649 Anemia, unspecified: Secondary | ICD-10-CM | POA: Diagnosis not present

## 2014-03-30 DIAGNOSIS — Z87891 Personal history of nicotine dependence: Secondary | ICD-10-CM | POA: Diagnosis not present

## 2014-03-30 DIAGNOSIS — E871 Hypo-osmolality and hyponatremia: Secondary | ICD-10-CM | POA: Diagnosis not present

## 2014-03-30 DIAGNOSIS — Z791 Long term (current) use of non-steroidal anti-inflammatories (NSAID): Secondary | ICD-10-CM | POA: Diagnosis not present

## 2014-03-30 DIAGNOSIS — Z801 Family history of malignant neoplasm of trachea, bronchus and lung: Secondary | ICD-10-CM | POA: Diagnosis not present

## 2014-03-30 DIAGNOSIS — I10 Essential (primary) hypertension: Secondary | ICD-10-CM | POA: Diagnosis not present

## 2014-03-30 DIAGNOSIS — R630 Anorexia: Secondary | ICD-10-CM | POA: Insufficient documentation

## 2014-03-30 DIAGNOSIS — Z87448 Personal history of other diseases of urinary system: Secondary | ICD-10-CM | POA: Insufficient documentation

## 2014-03-30 DIAGNOSIS — Z8739 Personal history of other diseases of the musculoskeletal system and connective tissue: Secondary | ICD-10-CM | POA: Insufficient documentation

## 2014-03-30 DIAGNOSIS — Z87438 Personal history of other diseases of male genital organs: Secondary | ICD-10-CM | POA: Diagnosis not present

## 2014-03-30 DIAGNOSIS — Z79899 Other long term (current) drug therapy: Secondary | ICD-10-CM | POA: Insufficient documentation

## 2014-03-30 DIAGNOSIS — R5383 Other fatigue: Secondary | ICD-10-CM | POA: Diagnosis not present

## 2014-03-30 DIAGNOSIS — R531 Weakness: Secondary | ICD-10-CM | POA: Diagnosis present

## 2014-03-30 LAB — CBC WITH DIFFERENTIAL/PLATELET
Basophils Absolute: 0 10*3/uL (ref 0.0–0.1)
Basophils Relative: 1 % (ref 0–1)
EOS ABS: 0.4 10*3/uL (ref 0.0–0.7)
Eosinophils Relative: 7 % — ABNORMAL HIGH (ref 0–5)
HEMATOCRIT: 34.8 % — AB (ref 39.0–52.0)
Hemoglobin: 11.9 g/dL — ABNORMAL LOW (ref 13.0–17.0)
Lymphocytes Relative: 18 % (ref 12–46)
Lymphs Abs: 1 10*3/uL (ref 0.7–4.0)
MCH: 34.3 pg — ABNORMAL HIGH (ref 26.0–34.0)
MCHC: 34.2 g/dL (ref 30.0–36.0)
MCV: 100.3 fL — ABNORMAL HIGH (ref 78.0–100.0)
MONO ABS: 0.7 10*3/uL (ref 0.1–1.0)
MONOS PCT: 12 % (ref 3–12)
Neutro Abs: 3.5 10*3/uL (ref 1.7–7.7)
Neutrophils Relative %: 62 % (ref 43–77)
Platelets: 204 10*3/uL (ref 150–400)
RBC: 3.47 MIL/uL — ABNORMAL LOW (ref 4.22–5.81)
RDW: 14.4 % (ref 11.5–15.5)
WBC: 5.6 10*3/uL (ref 4.0–10.5)

## 2014-03-30 LAB — COMPREHENSIVE METABOLIC PANEL
ALT: 17 U/L (ref 0–53)
ANION GAP: 11 (ref 5–15)
AST: 19 U/L (ref 0–37)
Albumin: 3.6 g/dL (ref 3.5–5.2)
Alkaline Phosphatase: 55 U/L (ref 39–117)
BILIRUBIN TOTAL: 0.3 mg/dL (ref 0.3–1.2)
BUN: 22 mg/dL (ref 6–23)
CALCIUM: 9.1 mg/dL (ref 8.4–10.5)
CO2: 28 mEq/L (ref 19–32)
CREATININE: 1.5 mg/dL — AB (ref 0.50–1.35)
Chloride: 94 mEq/L — ABNORMAL LOW (ref 96–112)
GFR calc Af Amer: 50 mL/min — ABNORMAL LOW (ref >90)
GFR calc non Af Amer: 43 mL/min — ABNORMAL LOW (ref >90)
Glucose, Bld: 113 mg/dL — ABNORMAL HIGH (ref 70–99)
Potassium: 4.6 mEq/L (ref 3.7–5.3)
Sodium: 133 mEq/L — ABNORMAL LOW (ref 137–147)
TOTAL PROTEIN: 6.4 g/dL (ref 6.0–8.3)

## 2014-03-30 LAB — URINALYSIS, ROUTINE W REFLEX MICROSCOPIC
BILIRUBIN URINE: NEGATIVE
GLUCOSE, UA: NEGATIVE mg/dL
Hgb urine dipstick: NEGATIVE
KETONES UR: NEGATIVE mg/dL
LEUKOCYTES UA: NEGATIVE
NITRITE: NEGATIVE
PH: 7.5 (ref 5.0–8.0)
PROTEIN: NEGATIVE mg/dL
Specific Gravity, Urine: 1.008 (ref 1.005–1.030)
Urobilinogen, UA: 0.2 mg/dL (ref 0.0–1.0)

## 2014-03-30 MED ORDER — SODIUM CHLORIDE 0.9 % IV BOLUS (SEPSIS)
1000.0000 mL | Freq: Once | INTRAVENOUS | Status: AC
  Administered 2014-03-30: 1000 mL via INTRAVENOUS

## 2014-03-30 MED ORDER — SODIUM CHLORIDE 0.9 % IV BOLUS (SEPSIS)
1000.0000 mL | Freq: Once | INTRAVENOUS | Status: DC
Start: 2014-03-30 — End: 2014-03-30

## 2014-03-30 NOTE — Telephone Encounter (Signed)
Dr Justin Mend saw pt yesterday for delirium, confusion. Na 127. She scheduled him for IVF at [facility].

## 2014-03-30 NOTE — ED Notes (Signed)
Pt sent here from [facility] [place] for hyponatremia and weakness.  Md relates he has hx of CA and is on chemo.  Referred for IV fluids and recheck of Na level post hydration.  EDP made aware.

## 2014-03-30 NOTE — ED Provider Notes (Signed)
CSN: [id number]     Arrival date & time 03/30/14  7829 History   First MD Initiated Contact with Patient 03/30/14 1014     Chief Complaint  Patient presents with  . Weakness     (Consider location/radiation/quality/duration/timing/severity/associated sxs/prior Treatment) HPI Comments: Patient was sent from his PCPs office for hyponatremia and possible dehydration. He reports he has a history of fluctuating hyponatremia do to lung cancer. He's been having increased weakness for the past 2 or 3 days and his daughter reports he has been intermittently confused. His appetite has been fair. He has had no falls or injuries. Has had no fevers, chills, cough is chronic and unchanged. No nausea vomiting or diarrhea. His last chemotherapy treatment has been delayed as he has been so weak. He had blood work drawn last night and was called with sodium of 127 today. His primary Dr. has been has been low and has been following it.   Past Medical History  Diagnosis Date  . BENIGN PROSTATIC HYPERTROPHY 07/23/2008  . 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 HIP PAIN, LEFT 07/04/2007  . HYPERLIPIDEMIA 01/02/2007  . HYPERTENSION 01/02/2007  . INSOMNIA 11/16/2008  . PROSTATE SPECIFIC ANTIGEN, ELEVATED 09/10/2008  . AAA (abdominal aortic aneurysm)   . Dysrhythmia     afib  . Shortness of breath   . COPD (chronic obstructive pulmonary disease)   . GERD (gastroesophageal reflux disease)   . Anemia   . History of blood transfusion   . Lung cancer dx'd 11/2012    metastatic non small cell lung cancer, adenocarcinoma    Past Surgical History  Procedure Laterality Date  . Transurethral resection of prostate  2004  . Total hip arthroplasty  2010, 2011    2010- left; 2011- right  . Upper gastrointestinal endoscopy    . Abdominal aortic endovascular stent graft N/A 04/14/2013    Procedure: ABDOMINAL AORTIC ENDOVASCULAR STENT GRAFT;  Surgeon: Conrad Osyka, MD;  Location: [facility] OR;  Service: Vascular;  Laterality:  N/A;   Family History  Problem Relation Age of Onset  . Colon polyps Neg Hx   . Stomach cancer Neg Hx   . Colon cancer Neg Hx   . Cancer Neg Hx    History  Substance Use Topics  . Smoking status: Former Smoker -- 1.00 packs/day for 40 years    Types: Cigarettes    Quit date: 06/18/1998  . Smokeless tobacco: Never Used  . Alcohol Use: No    Review of Systems  Constitutional: Positive for activity change, appetite change and fatigue. Negative for fever.  HENT: Negative for congestion, facial swelling, rhinorrhea and trouble swallowing.   Eyes: Negative for photophobia and pain.  Respiratory: Negative for cough, chest tightness and shortness of breath.   Cardiovascular: Negative for chest pain and leg swelling.  Gastrointestinal: Negative for nausea, vomiting, abdominal pain, diarrhea and constipation.  Endocrine: Negative for polydipsia and polyuria.  Genitourinary: Negative for dysuria, urgency, decreased urine volume and difficulty urinating.  Musculoskeletal: Negative for back pain and gait problem.  Skin: Negative for color change, rash and wound.  Allergic/Immunologic: Negative for immunocompromised state.  Neurological: Positive for weakness. Negative for dizziness, facial asymmetry, speech difficulty, numbness and headaches.  Psychiatric/Behavioral: Negative for confusion, decreased concentration and agitation.      Allergies  Ativan  Home Medications   Prior to Admission medications   Medication Sig Start Date End Date Taking? Authorizing Provider  naproxen sodium (ANAPROX) 220 MG tablet Take 220 mg by mouth 2 (  two) times daily with a meal.   Yes Historical Provider, MD  polyethylene glycol (MIRALAX / GLYCOLAX) packet Take 17 g by mouth daily.   Yes Historical Provider, MD  acetaminophen (TYLENOL) 325 MG tablet Take 650 mg by mouth every 6 (six) hours as needed.    Historical Provider, MD  ALPRAZolam Duanne Moron) 0.25 MG tablet Take 1 tablet (0.25 mg total) by mouth as  directed. 1 tablet 30 minutes prior to CT of the head 03/26/14   Curt Bears, MD  cholecalciferol (VITAMIN D) 1000 UNITS tablet Take 1,000 Units by mouth every morning.     Historical Provider, MD  dexamethasone (DECADRON) 4 MG tablet TAKE 1 TAB BY MOUTH TWICE DAILY THE DAY BEFORE, THE DAY OF, & THE DAY AFTER CHEMOTHERAPY EVERY 3 WKS 01/19/14   Curt Bears, MD  folic acid (FOLVITE) 1 MG tablet Take 1 tablet (1 mg total) by mouth daily. 12/28/13   Curt Bears, MD  guaiFENesin (MUCINEX) 600 MG 12 hr tablet Take 600 mg by mouth 2 (two) times daily.    Historical Provider, MD  multivitamin-iron-minerals-folic acid (CENTRUM) chewable tablet Chew 1 tablet by mouth every morning.     Historical Provider, MD  propranolol (INDERAL) 10 MG tablet Take 1 tablet by mouth 2 (two) times daily. 04/02/13   Historical Provider, MD  Umeclidinium-Vilanterol (ANORO ELLIPTA) 62.5-25 MCG/INH AEPB Inhale 1 puff into the lungs daily.    Historical Provider, MD   BP 186/104  Pulse 72  Temp(Src) 98.7 F (37.1 C) (Oral)  Resp 20  SpO2 98% Physical Exam  Constitutional: He is oriented to person, place, and time. He appears well-developed and well-nourished. No distress.  HENT:  Head: Normocephalic and atraumatic.  Mouth/Throat: No oropharyngeal exudate.  Eyes: Pupils are equal, round, and reactive to light.  Neck: Normal range of motion. Neck supple.  Cardiovascular: Normal rate, regular rhythm and normal heart sounds.  Exam reveals no gallop and no friction rub.   No murmur heard. Pulmonary/Chest: Effort normal and breath sounds normal. No respiratory distress. He has no wheezes. He has no rales.  Abdominal: Soft. Bowel sounds are normal. He exhibits no distension and no mass. There is no tenderness. There is no rebound and no guarding.  Musculoskeletal: Normal range of motion. He exhibits no edema and no tenderness.  Neurological: He is alert and oriented to person, place, and time. He has normal strength.  He displays no atrophy and no tremor. No cranial nerve deficit or sensory deficit. He exhibits normal muscle tone. He displays no seizure activity. Coordination and gait normal. GCS eye subscore is 4. GCS verbal subscore is 5. GCS motor subscore is 6.  Skin: Skin is warm and dry.  Psychiatric: He has a normal mood and affect.    ED Course  Procedures (including critical care time) Labs Review Labs Reviewed  CBC WITH DIFFERENTIAL - Abnormal; Notable for the following:    RBC 3.47 (*)    Hemoglobin 11.9 (*)    HCT 34.8 (*)    MCV 100.3 (*)    MCH 34.3 (*)    Eosinophils Relative 7 (*)    All other components within normal limits  COMPREHENSIVE METABOLIC PANEL - Abnormal; Notable for the following:    Sodium 133 (*)    Chloride 94 (*)    Glucose, Bld 113 (*)    Creatinine, Ser 1.50 (*)    GFR calc non Af Amer 43 (*)    GFR calc Af Amer 50 (*)  All other components within normal limits  URINE CULTURE  URINALYSIS, ROUTINE W REFLEX MICROSCOPIC    Imaging Review Dg Chest 2 View  03/30/2014   CLINICAL DATA:  Weakness.  History of lung cancer.  Chemotherapy.  EXAM: CHEST  2 VIEW  COMPARISON:  CT 03/22/2014.  Chest x-ray 02/08/2014.  FINDINGS: Mediastinum hilar structures normal. Bilateral pulmonary nodules are noted consistent metastatic disease. These are increased in number from prior chest x-ray 02/08/2014. No pleural effusion or pneumothorax. Stable elevation left hemidiaphragm. Stable cardiomegaly with normal pulmonary vascularity. No acute bony abnormality identified.  IMPRESSION: 1. Multiple bilateral pulmonary nodules, increased from prior chest x-ray of 02/08/2014. The/consistent with pulmonary metastatic disease. 2. Stable cardiomegaly.  No acute cardiopulmonary disease.   Electronically Signed   By: Marcello Moores  Register   On: 03/30/2014 11:51     EKG Interpretation None      MDM   Final diagnoses:  Other fatigue  Hyponatremia    Pt is a 80 y.o. male with Pmhx as above  who presents with several days of generalized weakness, fatigue and intermittent confusion. He saw his PCP 2 days ago and blood work was drawn yesterday. They called him this morning with result a sodium of 127. He was referred to the ED for IV fluids and repeat examination. Upon arrival patient states he actually feels somewhat better today. He has had poor appetite. He denies fevers or chills. He has chronic unchanged low-back pain. No focal numbness or weakness. He has had a chronic gradually worsening cough. On physical exam vital signs are stable and patient is in no acute distress. Mucous membranes are dry. Cardiopulmonary and neurologic exam is benign. He is alert and oriented x3. Low back pain is not reproducible. Liter of IV fluids will be given and repeat lab work will be drawn.   2:25PM Pt feeling much improved after IVF. Na improved from 127 to 133. Will d/c home w/ plan for close outpt f/u. Return precautions given for new or worsening symptoms including confusion, fever, inability to tolerate PO.    Ernestina Patches, MD 03/30/14 [phone]

## 2014-03-30 NOTE — [facility] Oncology Navigator Note (Unsigned)
Patient's daughter called.  She was at the appointment this week with patient.  She stated that she is worried about his overall condition.  I listened as she explained.  I did think patients mental state was not good and believed that was taking affect on his physical state.  She agreed. Patient is seeing a psychiatrist for help.  The daughter is hopeful this will help.  We did discuss palliative care and was that entailed. She stated she has a friend who works with hospice and they have spoken to the family several times.  She is going to speak to her brother about palliative care and hospice.  She would like to have a plan due to patient condition.  I agreed and offered support if needed.    She did state she was thankful for Dr. Worthy Flank help through this process.  I will update Dr. Julien Nordmann.

## 2014-03-30 NOTE — Discharge Instructions (Signed)
Hyponatremia  °Hyponatremia is when the amount of salt (sodium) in your blood is too low. When sodium levels are low, your cells will absorb extra water and swell. The swelling happens throughout the body, but it mostly affects the brain. Severe brain swelling (cerebral edema), seizures, or coma can happen.  °CAUSES  °· Heart, kidney, or liver problems. °· Thyroid problems. °· Adrenal gland problems. °· Severe vomiting and diarrhea. °· Certain medicines or illegal drugs. °· Dehydration. °· Drinking too much water. °· Low-sodium diet. °SYMPTOMS  °· Nausea and vomiting. °· Confusion. °· Lethargy. °· Agitation. °· Headache. °· Twitching or shaking (seizures). °· Unconsciousness. °· Appetite loss. °· Muscle weakness and cramping. °DIAGNOSIS  °Hyponatremia is identified by a simple blood test. Your caregiver will perform a history and physical exam to try to find the cause and type of hyponatremia. Other tests may be needed to measure the amount of sodium in your blood and urine. °TREATMENT  °Treatment will depend on the cause.  °· Fluids may be given through the vein (IV). °· Medicines may be used to correct the sodium imbalance. If medicines are causing the problem, they will need to be adjusted. °· Water or fluid intake may be restricted to restore proper balance. °The speed of correcting the sodium problem is very important. If the problem is corrected too fast, nerve damage (sometimes unchangeable) can happen. °HOME CARE INSTRUCTIONS  °· Only take medicines as directed by your caregiver. Many medicines can make hyponatremia worse. Discuss all your medicines with your caregiver. °· Carefully follow any recommended diet, including any fluid restrictions. °· You may be asked to repeat lab tests. Follow these directions. °· Avoid alcohol and recreational drugs. °SEEK MEDICAL CARE IF:  °· You develop worsening nausea, fatigue, headache, confusion, or weakness. °· Your original hyponatremia symptoms return. °· You have  problems following the recommended diet. °SEEK IMMEDIATE MEDICAL CARE IF:  °· You have a seizure. °· You faint. °· You have ongoing diarrhea or vomiting. °MAKE SURE YOU:  °· Understand these instructions. °· Will watch your condition. °· Will get help right away if you are not doing well or get worse. °Document Released: 05/25/2002 Document Revised: 08/27/2011 Document Reviewed: 11/19/2010 °[employer]® Patient Information ©2015 [employer]. This information is not intended to replace advice given to you by your health care provider. Make sure you discuss any questions you have with your health care provider. ° °

## 2014-03-31 ENCOUNTER — Telehealth: Payer: Self-pay | Admitting: Nurse Practitioner

## 2014-03-31 LAB — URINE CULTURE
COLONY COUNT: NO GROWTH
CULTURE: NO GROWTH

## 2014-03-31 NOTE — Telephone Encounter (Signed)
Patient's daughter called to verify if it is OK to use ultrasound as part of PT program. Inbasket send to Dr. Julien Nordmann. Will follow up with patient.

## 2014-04-01 ENCOUNTER — Ambulatory Visit (INDEPENDENT_AMBULATORY_CARE_PROVIDER_SITE_OTHER): Payer: No Typology Code available for payment source | Admitting: Psychology

## 2014-04-01 ENCOUNTER — Telehealth (HOSPITAL_COMMUNITY): Payer: Self-pay | Admitting: *Deleted

## 2014-04-01 ENCOUNTER — Encounter (HOSPITAL_COMMUNITY): Admission: RE | Admit: 2014-04-01 | Payer: Medicare Other | Source: Ambulatory Visit

## 2014-04-01 ENCOUNTER — Telehealth: Payer: Self-pay | Admitting: Nurse Practitioner

## 2014-04-01 DIAGNOSIS — F4323 Adjustment disorder with mixed anxiety and depressed mood: Secondary | ICD-10-CM

## 2014-04-01 NOTE — Telephone Encounter (Signed)
Left message that it is OK to use ultrasound in PT program per Dr. Julien Nordmann. Instructed to call our office with further questions.

## 2014-04-02 ENCOUNTER — Other Ambulatory Visit: Payer: Self-pay

## 2014-04-05 ENCOUNTER — Telehealth: Payer: Self-pay | Admitting: *Deleted

## 2014-04-05 NOTE — Telephone Encounter (Signed)
Tyler [name] called stating that she wants to cancel all appts right now until pt has gained more strength.  She also wanted to know if pt needs to continue folic acid.  Called back and left a msg that Dr Vista Mink will be informed of pt's decision to postpone tx for now, and he does not need to keep taking folic acid for now if he finishes his current rx unless he resumes chemo with alimta.

## 2014-04-06 ENCOUNTER — Encounter (HOSPITAL_BASED_OUTPATIENT_CLINIC_OR_DEPARTMENT_OTHER): Payer: Self-pay | Admitting: Emergency Medicine

## 2014-04-06 ENCOUNTER — Encounter (HOSPITAL_COMMUNITY): Payer: Medicare Other

## 2014-04-06 ENCOUNTER — Emergency Department (HOSPITAL_BASED_OUTPATIENT_CLINIC_OR_DEPARTMENT_OTHER)
Admission: EM | Admit: 2014-04-06 | Discharge: 2014-04-06 | Disposition: A | Discharge status: Home / Self Care | Payer: Medicare Other | Attending: Emergency Medicine | Admitting: Emergency Medicine

## 2014-04-06 DIAGNOSIS — I499 Cardiac arrhythmia, unspecified: Secondary | ICD-10-CM | POA: Diagnosis not present

## 2014-04-06 DIAGNOSIS — D649 Anemia, unspecified: Secondary | ICD-10-CM | POA: Insufficient documentation

## 2014-04-06 DIAGNOSIS — Z87891 Personal history of nicotine dependence: Secondary | ICD-10-CM | POA: Insufficient documentation

## 2014-04-06 DIAGNOSIS — Z791 Long term (current) use of non-steroidal anti-inflammatories (NSAID): Secondary | ICD-10-CM | POA: Insufficient documentation

## 2014-04-06 DIAGNOSIS — E871 Hypo-osmolality and hyponatremia: Secondary | ICD-10-CM | POA: Diagnosis not present

## 2014-04-06 DIAGNOSIS — Z79899 Other long term (current) drug therapy: Secondary | ICD-10-CM | POA: Insufficient documentation

## 2014-04-06 DIAGNOSIS — K219 Gastro-esophageal reflux disease without esophagitis: Secondary | ICD-10-CM | POA: Diagnosis not present

## 2014-04-06 DIAGNOSIS — E860 Dehydration: Secondary | ICD-10-CM

## 2014-04-06 DIAGNOSIS — Z85118 Personal history of other malignant neoplasm of bronchus and lung: Secondary | ICD-10-CM | POA: Insufficient documentation

## 2014-04-06 DIAGNOSIS — R7989 Other specified abnormal findings of blood chemistry: Secondary | ICD-10-CM | POA: Diagnosis present

## 2014-04-06 DIAGNOSIS — I10 Essential (primary) hypertension: Secondary | ICD-10-CM | POA: Diagnosis not present

## 2014-04-06 DIAGNOSIS — Z7952 Long term (current) use of systemic steroids: Secondary | ICD-10-CM | POA: Insufficient documentation

## 2014-04-06 DIAGNOSIS — J449 Chronic obstructive pulmonary disease, unspecified: Secondary | ICD-10-CM | POA: Diagnosis not present

## 2014-04-06 DIAGNOSIS — Z87448 Personal history of other diseases of urinary system: Secondary | ICD-10-CM | POA: Insufficient documentation

## 2014-04-06 DIAGNOSIS — Z8669 Personal history of other diseases of the nervous system and sense organs: Secondary | ICD-10-CM | POA: Diagnosis not present

## 2014-04-06 LAB — CBC
HCT: 36.7 % — ABNORMAL LOW (ref 39.0–52.0)
Hemoglobin: 12.4 g/dL — ABNORMAL LOW (ref 13.0–17.0)
MCH: 33.1 pg (ref 26.0–34.0)
MCHC: 33.8 g/dL (ref 30.0–36.0)
MCV: 97.9 fL (ref 78.0–100.0)
PLATELETS: 150 10*3/uL (ref 150–400)
RBC: 3.75 MIL/uL — AB (ref 4.22–5.81)
RDW: 13.7 % (ref 11.5–15.5)
WBC: 8.2 10*3/uL (ref 4.0–10.5)

## 2014-04-06 LAB — BASIC METABOLIC PANEL
ANION GAP: 14 (ref 5–15)
BUN: 23 mg/dL (ref 6–23)
CALCIUM: 9.8 mg/dL (ref 8.4–10.5)
CO2: 27 meq/L (ref 19–32)
CREATININE: 1.4 mg/dL — AB (ref 0.50–1.35)
Chloride: 89 mEq/L — ABNORMAL LOW (ref 96–112)
GFR calc Af Amer: 54 mL/min — ABNORMAL LOW (ref >90)
GFR, EST NON AFRICAN AMERICAN: 47 mL/min — AB (ref >90)
Glucose, Bld: 116 mg/dL — ABNORMAL HIGH (ref 70–99)
Potassium: 4.2 mEq/L (ref 3.7–5.3)
Sodium: 130 mEq/L — ABNORMAL LOW (ref 137–147)

## 2014-04-06 LAB — URINALYSIS, ROUTINE W REFLEX MICROSCOPIC
BILIRUBIN URINE: NEGATIVE
Glucose, UA: NEGATIVE mg/dL
Hgb urine dipstick: NEGATIVE
Ketones, ur: NEGATIVE mg/dL
Leukocytes, UA: NEGATIVE
NITRITE: NEGATIVE
PH: 6.5 (ref 5.0–8.0)
PROTEIN: NEGATIVE mg/dL
Specific Gravity, Urine: 1.013 (ref 1.005–1.030)
Urobilinogen, UA: 0.2 mg/dL (ref 0.0–1.0)

## 2014-04-06 MED ORDER — SODIUM CHLORIDE 0.9 % IV BOLUS (SEPSIS)
1000.0000 mL | Freq: Once | INTRAVENOUS | Status: AC
  Administered 2014-04-06: 1000 mL via INTRAVENOUS

## 2014-04-06 NOTE — ED Notes (Addendum)
Pt's  NA 125 by blood work done yesterday. Sent here by PMD for eval

## 2014-04-06 NOTE — ED Provider Notes (Signed)
CSN: [id number]     Arrival date & time 04/06/14  2018 History   First MD Initiated Contact with Patient 04/06/14 2057     Chief Complaint  Patient presents with  . abnormal labs      (Consider location/radiation/quality/duration/timing/severity/associated sxs/prior Treatment) HPI Comments: Sent for hyponatremia at 125. Hx of same, given fluids with resolution. Sodium not as low last week, around 130.  Patient is a 80 y.o. male presenting with weakness. The history is provided by the patient.  Weakness This is a recurrent problem. The current episode started yesterday. The problem occurs constantly. The problem has not changed since onset.Pertinent negatives include no chest pain, no abdominal pain and no shortness of breath. Nothing aggravates the symptoms. Nothing relieves the symptoms.    Past Medical History  Diagnosis Date  . BENIGN PROSTATIC HYPERTROPHY 07/23/2008  . 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 HIP PAIN, LEFT 07/04/2007  . HYPERLIPIDEMIA 01/02/2007  . HYPERTENSION 01/02/2007  . INSOMNIA 11/16/2008  . PROSTATE SPECIFIC ANTIGEN, ELEVATED 09/10/2008  . AAA (abdominal aortic aneurysm)   . Dysrhythmia     afib  . Shortness of breath   . COPD (chronic obstructive pulmonary disease)   . GERD (gastroesophageal reflux disease)   . Anemia   . History of blood transfusion   . Lung cancer dx'd 11/2012    metastatic non small cell lung cancer, adenocarcinoma    Past Surgical History  Procedure Laterality Date  . Transurethral resection of prostate  2004  . Total hip arthroplasty  2010, 2011    2010- left; 2011- right  . Upper gastrointestinal endoscopy    . Abdominal aortic endovascular stent graft N/A 04/14/2013    Procedure: ABDOMINAL AORTIC ENDOVASCULAR STENT GRAFT;  Surgeon: Conrad Herrings, MD;  Location: [facility] OR;  Service: Vascular;  Laterality: N/A;   Family History  Problem Relation Age of Onset  . Colon polyps Neg Hx   . Stomach cancer Neg Hx   . Colon cancer Neg  Hx   . Cancer Neg Hx    History  Substance Use Topics  . Smoking status: Former Smoker -- 1.00 packs/day for 40 years    Types: Cigarettes    Quit date: 06/18/1998  . Smokeless tobacco: Never Used  . Alcohol Use: No    Review of Systems  Constitutional: Negative for fever and chills.  Respiratory: Negative for cough and shortness of breath.   Cardiovascular: Negative for chest pain and leg swelling.  Gastrointestinal: Negative for vomiting and abdominal pain.  Neurological: Positive for weakness.  All other systems reviewed and are negative.     Allergies  Ativan  Home Medications   Prior to Admission medications   Medication Sig Start Date End Date Taking? Authorizing Provider  acetaminophen (TYLENOL) 325 MG tablet Take 650 mg by mouth every 6 (six) hours as needed.    Historical Provider, MD  ALPRAZolam Duanne Moron) 0.25 MG tablet Take 1 tablet (0.25 mg total) by mouth as directed. 1 tablet 30 minutes prior to CT of the head 03/26/14   Curt Bears, MD  cholecalciferol (VITAMIN D) 1000 UNITS tablet Take 1,000 Units by mouth every morning.     Historical Provider, MD  dexamethasone (DECADRON) 4 MG tablet TAKE 1 TAB BY MOUTH TWICE DAILY THE DAY BEFORE, THE DAY OF, & THE DAY AFTER CHEMOTHERAPY EVERY 3 WKS 01/19/14   Curt Bears, MD  folic acid (FOLVITE) 1 MG tablet Take 1 tablet (1 mg total) by mouth daily. 12/28/13  Curt Bears, MD  guaiFENesin (MUCINEX) 600 MG 12 hr tablet Take 600 mg by mouth 2 (two) times daily.    Historical Provider, MD  multivitamin-iron-minerals-folic acid (CENTRUM) chewable tablet Chew 1 tablet by mouth every morning.     Historical Provider, MD  naproxen sodium (ANAPROX) 220 MG tablet Take 220 mg by mouth 2 (two) times daily with a meal.    Historical Provider, MD  polyethylene glycol (MIRALAX / GLYCOLAX) packet Take 17 g by mouth daily.    Historical Provider, MD  propranolol (INDERAL) 10 MG tablet Take 1 tablet by mouth 2 (two) times daily.  04/02/13   Historical Provider, MD  Umeclidinium-Vilanterol (ANORO ELLIPTA) 62.5-25 MCG/INH AEPB Inhale 1 puff into the lungs daily.    Historical Provider, MD   BP 182/103  Pulse 83  Temp(Src) 98.1 F (36.7 C) (Oral)  Resp 16  Wt 179 lb (81.194 kg)  SpO2 97% Physical Exam  Nursing note and vitals reviewed. Constitutional: He is oriented to person, place, and time. He appears well-developed and well-nourished. No distress.  HENT:  Head: Normocephalic and atraumatic.  Mouth/Throat: Oropharynx is clear and moist. No oropharyngeal exudate.  Eyes: EOM are normal. Pupils are equal, round, and reactive to light.  Neck: Normal range of motion. Neck supple.  Cardiovascular: Normal rate and regular rhythm.  Exam reveals no friction rub.   No murmur heard. Pulmonary/Chest: Effort normal and breath sounds normal. No respiratory distress. He has no wheezes. He has no rales.  Abdominal: He exhibits no distension. There is no tenderness. There is no rebound.  Musculoskeletal: Normal range of motion. He exhibits no edema.  Neurological: He is alert and oriented to person, place, and time. No cranial nerve deficit. He exhibits normal muscle tone. Coordination normal.  Skin: He is not diaphoretic.    ED Course  Procedures (including critical care time) Labs Review Labs Reviewed  CBC - Abnormal; Notable for the following:    RBC 3.75 (*)    Hemoglobin 12.4 (*)    HCT 36.7 (*)    All other components within normal limits  BASIC METABOLIC PANEL - Abnormal; Notable for the following:    Sodium 130 (*)    Chloride 89 (*)    Glucose, Bld 116 (*)    Creatinine, Ser 1.40 (*)    GFR calc non Af Amer 47 (*)    GFR calc Af Amer 54 (*)    All other components within normal limits  URINALYSIS, ROUTINE W REFLEX MICROSCOPIC    Imaging Review No results found.   EKG Interpretation None      MDM   Final diagnoses:  Hyponatremia  Dehydration    21M presents with weakness. Had labs drawn  today by his physician - sodium was 125. Patient had low sodium last week, was given fluids with some improvement.  Hx of lung cancer, isn't currently on chemo. Has some long standing waxing and waning confusion, generalized weakness.  Sodium here 130. Well appearing, no focal neuro deficits. Given 1 L NS. He would like to go home. No need for repeat labs after fluid hydration. Cl low at 89, likely due to dehydration. Will f/u with PCP in 1-2 days.    Evelina Bucy, MD 04/07/14 [phone]

## 2014-04-06 NOTE — ED Notes (Signed)
MD at bedside. 

## 2014-04-06 NOTE — Discharge Instructions (Signed)
Dehydration Dehydration is when you lose more fluids from the body than you take in. Vital organs such as the kidneys, brain, and heart cannot function without a proper amount of fluids and salt. Any loss of fluids from the body can cause dehydration.  Older adults are at a higher risk of dehydration than younger adults. As we age, our bodies are less able to conserve water and do not respond to temperature changes as well. Also, older adults do not become thirsty as easily or quickly. Because of this, older adults often do not realize they need to increase fluids to avoid dehydration.  CAUSES   Vomiting.  Diarrhea.  Excessive sweating.  Excessive urination.  Fever.  Certain medicines, such as blood pressure medicines called diuretics.  Poorly controlled blood sugars. SIGNS AND SYMPTOMS  Mild dehydration:  Thirst.  Dry lips.  Slightly dry mouth. Moderate dehydration:  Very dry mouth.  Sunken eyes.  Skin does not bounce back quickly when lightly pinched and released.  Dark urine and decreased urine production.  Decreased tear production.  Headache. Severe dehydration:  Very dry mouth.  Extreme thirst.  Rapid, weak pulse (more than 100 beats per minute at rest).  Cold hands and feet.  Not able to sweat in spite of heat.  Rapid breathing.  Blue lips.  Confusion and lethargy.  Difficulty being awakened.  Minimal urine production.  No tears. DIAGNOSIS  Your health care provider will diagnose dehydration based on your symptoms and your exam. Blood and urine tests will help confirm the diagnosis. The diagnostic evaluation should also identify the cause of dehydration. TREATMENT  Treatment of mild or moderate dehydration can often be done at home by increasing the amount of fluids that you drink. It is best to drink small amounts of fluid more often. Drinking too much at one time can make vomiting worse. Severe dehydration needs to be treated at the hospital.  You may be given IV fluids that contain water and electrolytes. HOME CARE INSTRUCTIONS   Ask your health care provider about specific rehydration instructions.  Drink enough fluids to keep your urine clear or pale yellow.  Drink small amounts frequently if you have nausea and vomiting.  Eat as you normally do.  Avoid:  Foods or drinks high in sugar.  Carbonated drinks.  Juice.  Extremely hot or cold fluids.  Drinks with caffeine.  Fatty, greasy foods.  Alcohol.  Tobacco.  Overeating.  Gelatin desserts.  Wash your hands well to avoid spreading bacteria and viruses.  Only take over-the-counter or prescription medicines for pain, discomfort, or fever as directed by your health care provider.  Ask your health care provider if you should continue all prescribed and over-the-counter medicines.  Keep all follow-up appointments with your health care provider. SEEK MEDICAL CARE IF:  You have abdominal pain, and it increases or stays in one area (localizes).  You have a rash, stiff neck, or severe headache.  You are irritable, sleepy, or difficult to awaken.  You are weak, dizzy, or extremely thirsty.  You have a fever. SEEK IMMEDIATE MEDICAL CARE IF:   You are unable to keep fluids down, or you get worse despite treatment.  You have frequent episodes of vomiting or diarrhea.  You have blood or green matter (bile) in your vomit.  You have blood in your stool, or your stool looks black and tarry.  You have not urinated in 6-8 hours, or you have only urinated a small amount of very dark urine.  You faint. MAKE SURE YOU:   Understand these instructions.  Will watch your condition.  Will get help right away if you are not doing well or get worse. Document Released: 08/25/2003 Document Revised: 06/09/2013 Document Reviewed: 02/09/2013 [facility] Patient Information 2015 [place], [place]. This information is not intended to replace advice given to you by your  health care provider. Make sure you discuss any questions you have with your health care provider.

## 2014-04-07 ENCOUNTER — Telehealth: Payer: Self-pay | Admitting: *Deleted

## 2014-04-07 ENCOUNTER — Telehealth: Payer: Self-pay | Admitting: Medical Oncology

## 2014-04-07 NOTE — Telephone Encounter (Signed)
I called and informed Dr Jason Nest office of Dr [facility] recommendation.

## 2014-04-07 NOTE — Telephone Encounter (Signed)
Message copied by Missael Ferrari, [place] on Wed Apr 07, 2014  4:05 PM ------      Message from: [facility], [place]      Created: Wed Apr 07, 2014  1:38 PM       He can try Salt tablet.      ----- Message -----         From: Ardeen Garland, RN         Sent: 04/07/2014   1:30 PM           To: Genia Plants, RN, Curt Bears, MD            Pt with ongoing low sodium affecting mental status.       Please ask Dr Julien Nordmann how he wants Dr Justin Mend to treat it- Fluid restriction , if so how much /24 hours vs salt tablet?       Call office back.        ------

## 2014-04-07 NOTE — Telephone Encounter (Signed)
Pt with ongoing low sodium affecting mental status. Please ask Dr Julien Nordmann how he wants Dr Justin Mend to treat it- Fluid restriction , if so how much /24 hours vs salt tablets.. Call office back. Note to [place].

## 2014-04-08 ENCOUNTER — Encounter (HOSPITAL_COMMUNITY): Payer: Medicare Other

## 2014-04-12 ENCOUNTER — Other Ambulatory Visit: Payer: Medicare Other

## 2014-04-12 ENCOUNTER — Ambulatory Visit: Payer: Medicare Other | Admitting: Physician Assistant

## 2014-04-13 ENCOUNTER — Inpatient Hospital Stay (HOSPITAL_COMMUNITY)
Admission: EM | Admit: 2014-04-13 | Discharge: 2014-04-14 | DRG: 641 | Disposition: A | Discharge status: Home Health | Payer: Medicare Other | Attending: Internal Medicine | Admitting: Internal Medicine

## 2014-04-13 ENCOUNTER — Encounter (HOSPITAL_COMMUNITY): Payer: Self-pay | Admitting: Emergency Medicine

## 2014-04-13 ENCOUNTER — Encounter (HOSPITAL_COMMUNITY): Payer: Medicare Other

## 2014-04-13 ENCOUNTER — Telehealth: Payer: Self-pay | Admitting: Medical Oncology

## 2014-04-13 ENCOUNTER — Emergency Department (HOSPITAL_COMMUNITY): Payer: Medicare Other

## 2014-04-13 DIAGNOSIS — Z96642 Presence of left artificial hip joint: Secondary | ICD-10-CM | POA: Diagnosis present

## 2014-04-13 DIAGNOSIS — E785 Hyperlipidemia, unspecified: Secondary | ICD-10-CM | POA: Diagnosis present

## 2014-04-13 DIAGNOSIS — E860 Dehydration: Secondary | ICD-10-CM | POA: Diagnosis present

## 2014-04-13 DIAGNOSIS — Z87891 Personal history of nicotine dependence: Secondary | ICD-10-CM

## 2014-04-13 DIAGNOSIS — E871 Hypo-osmolality and hyponatremia: Principal | ICD-10-CM | POA: Diagnosis present

## 2014-04-13 DIAGNOSIS — Z66 Do not resuscitate: Secondary | ICD-10-CM | POA: Diagnosis present

## 2014-04-13 DIAGNOSIS — K219 Gastro-esophageal reflux disease without esophagitis: Secondary | ICD-10-CM | POA: Diagnosis present

## 2014-04-13 DIAGNOSIS — C3412 Malignant neoplasm of upper lobe, left bronchus or lung: Secondary | ICD-10-CM | POA: Diagnosis present

## 2014-04-13 DIAGNOSIS — J449 Chronic obstructive pulmonary disease, unspecified: Secondary | ICD-10-CM | POA: Diagnosis present

## 2014-04-13 DIAGNOSIS — I10 Essential (primary) hypertension: Secondary | ICD-10-CM | POA: Diagnosis present

## 2014-04-13 DIAGNOSIS — R4182 Altered mental status, unspecified: Secondary | ICD-10-CM

## 2014-04-13 DIAGNOSIS — C3490 Malignant neoplasm of unspecified part of unspecified bronchus or lung: Secondary | ICD-10-CM | POA: Diagnosis present

## 2014-04-13 LAB — COMPREHENSIVE METABOLIC PANEL
ALT: 13 U/L (ref 0–53)
ANION GAP: 16 — AB (ref 5–15)
AST: 22 U/L (ref 0–37)
Albumin: 3.7 g/dL (ref 3.5–5.2)
Alkaline Phosphatase: 45 U/L (ref 39–117)
BUN: 14 mg/dL (ref 6–23)
CO2: 23 mEq/L (ref 19–32)
Calcium: 9.6 mg/dL (ref 8.4–10.5)
Chloride: 85 mEq/L — ABNORMAL LOW (ref 96–112)
Creatinine, Ser: 1.25 mg/dL (ref 0.50–1.35)
GFR calc Af Amer: 62 mL/min — ABNORMAL LOW (ref >90)
GFR calc non Af Amer: 53 mL/min — ABNORMAL LOW (ref >90)
GLUCOSE: 131 mg/dL — AB (ref 70–99)
Potassium: 4.3 mEq/L (ref 3.7–5.3)
Sodium: 124 mEq/L — ABNORMAL LOW (ref 137–147)
TOTAL PROTEIN: 6.5 g/dL (ref 6.0–8.3)
Total Bilirubin: 0.7 mg/dL (ref 0.3–1.2)

## 2014-04-13 LAB — CBC
HCT: 36.9 % — ABNORMAL LOW (ref 39.0–52.0)
HEMOGLOBIN: 13.1 g/dL (ref 13.0–17.0)
MCH: 33.4 pg (ref 26.0–34.0)
MCHC: 35.5 g/dL (ref 30.0–36.0)
MCV: 94.1 fL (ref 78.0–100.0)
Platelets: 156 10*3/uL (ref 150–400)
RBC: 3.92 MIL/uL — ABNORMAL LOW (ref 4.22–5.81)
RDW: 13.8 % (ref 11.5–15.5)
WBC: 7.6 10*3/uL (ref 4.0–10.5)

## 2014-04-13 LAB — URINALYSIS, ROUTINE W REFLEX MICROSCOPIC
Bilirubin Urine: NEGATIVE
Glucose, UA: NEGATIVE mg/dL
Hgb urine dipstick: NEGATIVE
Ketones, ur: NEGATIVE mg/dL
Leukocytes, UA: NEGATIVE
NITRITE: NEGATIVE
Protein, ur: NEGATIVE mg/dL
SPECIFIC GRAVITY, URINE: 1.007 (ref 1.005–1.030)
Urobilinogen, UA: 0.2 mg/dL (ref 0.0–1.0)
pH: 7 (ref 5.0–8.0)

## 2014-04-13 LAB — OSMOLALITY: OSMOLALITY: 277 mosm/kg (ref 275–300)

## 2014-04-13 LAB — SODIUM, URINE, RANDOM: SODIUM UR: 51 meq/L

## 2014-04-13 MED ORDER — UMECLIDINIUM-VILANTEROL 62.5-25 MCG/INH IN AEPB: 1.0000 | INHALATION_SPRAY | Freq: Every day | RESPIRATORY_TRACT | Status: DC

## 2014-04-13 MED ORDER — HEPARIN SODIUM (PORCINE) 5000 UNIT/ML IJ SOLN
5000.0000 [IU] | Freq: Three times a day (TID) | INTRAMUSCULAR | Status: DC
  Administered 2014-04-13 – 2014-04-14 (×2): 5000 [IU] via SUBCUTANEOUS
  Filled 2014-04-13 (×5): qty 1

## 2014-04-13 MED ORDER — ONDANSETRON HCL 4 MG/2ML IJ SOLN: 4.0000 mg | Freq: Three times a day (TID) | INTRAMUSCULAR | Status: AC | PRN

## 2014-04-13 MED ORDER — SODIUM CHLORIDE 0.9 % IV SOLN
INTRAVENOUS | Status: DC
  Administered 2014-04-13: 18:00:00 via INTRAVENOUS

## 2014-04-13 MED ORDER — ALBUTEROL SULFATE (2.5 MG/3ML) 0.083% IN NEBU: 2.5000 mg | INHALATION_SOLUTION | RESPIRATORY_TRACT | Status: DC | PRN

## 2014-04-13 MED ORDER — ADULT MULTIVITAMIN W/MINERALS CH
1.0000 | ORAL_TABLET | Freq: Every day | ORAL | Status: DC
  Filled 2014-04-13: qty 1

## 2014-04-13 MED ORDER — CENTRUM PO CHEW: 1.0000 | CHEWABLE_TABLET | Freq: Every morning | ORAL | Status: DC

## 2014-04-13 MED ORDER — HYDRALAZINE HCL 20 MG/ML IJ SOLN
10.0000 mg | Freq: Once | INTRAMUSCULAR | Status: AC
  Administered 2014-04-13: 10 mg via INTRAVENOUS
  Filled 2014-04-13: qty 1

## 2014-04-13 MED ORDER — FOLIC ACID 1 MG PO TABS
1.0000 mg | ORAL_TABLET | Freq: Every day | ORAL | Status: DC
  Administered 2014-04-14: 1 mg via ORAL
  Filled 2014-04-13: qty 1

## 2014-04-13 MED ORDER — SODIUM CHLORIDE 0.9 % IV SOLN
INTRAVENOUS | Status: DC
  Administered 2014-04-13 – 2014-04-14 (×2): via INTRAVENOUS

## 2014-04-13 MED ORDER — HYDRALAZINE HCL 20 MG/ML IJ SOLN: 10.0000 mg | INTRAMUSCULAR | Status: DC | PRN

## 2014-04-13 MED ORDER — PROPRANOLOL HCL 10 MG PO TABS
10.0000 mg | ORAL_TABLET | Freq: Two times a day (BID) | ORAL | Status: DC
  Administered 2014-04-13 – 2014-04-14 (×2): 10 mg via ORAL
  Filled 2014-04-13 (×3): qty 1

## 2014-04-13 MED ORDER — ACETAMINOPHEN 325 MG PO TABS: 650.0000 mg | ORAL_TABLET | Freq: Four times a day (QID) | ORAL | Status: DC | PRN

## 2014-04-13 MED ORDER — SODIUM CHLORIDE 0.9 % IV BOLUS (SEPSIS)
500.0000 mL | Freq: Once | INTRAVENOUS | Status: AC
  Administered 2014-04-13: 500 mL via INTRAVENOUS

## 2014-04-13 MED ORDER — VITAMIN D3 25 MCG (1000 UNIT) PO TABS
1000.0000 [IU] | ORAL_TABLET | Freq: Every morning | ORAL | Status: DC
  Administered 2014-04-14: 1000 [IU] via ORAL
  Filled 2014-04-13: qty 1

## 2014-04-13 MED ORDER — ACETAMINOPHEN 650 MG RE SUPP: 650.0000 mg | Freq: Four times a day (QID) | RECTAL | Status: DC | PRN

## 2014-04-13 MED ORDER — ALPRAZOLAM 0.25 MG PO TABS: 0.2500 mg | ORAL_TABLET | Freq: Every evening | ORAL | Status: DC | PRN

## 2014-04-13 NOTE — ED Notes (Signed)
Spoke with family about obtained urine sample, states patient will let them know when he is ready and they will help him use a urinal or let us know to help him, family states they do not want patient to have a catheter of any kind.

## 2014-04-13 NOTE — ED Notes (Signed)
Per family: Pt is currently receiving chemo for lung cancer. Pt sent here today from PCP Dr. Justin Mend dt NA 124. Per daughter NA level has been steadily declining. Pt started taking salt pills over last several days. Family also reports increased altered mental status over last several months, but increased over last several days. Pt is AO x4, but slow to answer questions. Reports generalized body aches. NAD.

## 2014-04-13 NOTE — Progress Notes (Signed)
NURSING PROGRESS NOTE  Tyler [name] [id number] Admission Data: 04/13/2014 8:16 PM Attending Provider: Phillips Climes, MD AVW:PVXY, Valla Leaver, MD Code Status: DNR  Tyler [name] is a 80 y.o. male patient admitted from ED:  -No acute distress noted.  -No complaints of shortness of breath.  -No complaints of chest pain.   Cardiac Monitoring: NONE  Blood pressure 149/71, pulse 91, temperature 98.4 F (36.9 C), temperature source Oral, resp. rate 22, height 5\' 8"  (8.016 m), weight 77.565 kg (171 lb), SpO2 97.00%.   IV Fluids:  IV in place, occlusive dsg intact without redness, IV cath hand right, condition patent and no redness normal saline @ 131ml/hr.   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  Past Surgical History:   has past surgical history that includes Transurethral resection of prostate (2004); Total hip arthroplasty (2010, 2011); Upper gastrointestinal endoscopy; and Abdominal aortic endovascular stent graft (N/A, 04/14/2013).  Social History:   reports that he quit smoking about 15 years ago. His smoking use included Cigarettes. He has a 40 pack-year smoking history. He has never used smokeless tobacco. He reports that he does not drink alcohol or use illicit drugs.  Skin:   Patient/Family orientated to room. Information packet given to patient/family. Admission inpatient armband information verified with patient/family to include name and date of birth and placed on patient arm. Side rails up x 1,  fall assessment and education completed with patient/family. Patient/family able to verbalize understanding of risk associated with falls and verbalized understanding to call for assistance before getting out of bed. Call light within reach. Patient/family able to voice and demonstrate understanding of unit orientation instructions.  Family at bedside.

## 2014-04-13 NOTE — ED Notes (Signed)
Report given to floor, they expressed concern about patient BP, admitting MD paged to notify and discuss further orders to control.

## 2014-04-13 NOTE — Progress Notes (Signed)
Received report from [place], [place], will await for pt. To arrive to [place]  Alphonzo Lemmings, RN

## 2014-04-13 NOTE — ED Provider Notes (Addendum)
CSN: [id number]     Arrival date & time 04/13/14  1506 History   First MD Initiated Contact with Patient 04/13/14 1605     Chief Complaint  Patient presents with  . Altered Mental Status  . Abnormal Lab      HPI Per family: Pt is currently receiving chemo for lung cancer. Pt sent here today from PCP Dr. Justin Mend dt NA 124. Per daughter NA level has been steadily declining. Pt started taking salt pills over last several days. Family also reports increased altered mental status over last several months, but increased over last several days. Pt is AO x4, but slow to answer questions.  Daughter reports has been a markedly change over the last 4872 hours and is general ability to stand without assistance.  Reports generalized body aches.  Past Medical History  Diagnosis Date  . BENIGN PROSTATIC HYPERTROPHY 07/23/2008  . 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 HIP PAIN, LEFT 07/04/2007  . HYPERLIPIDEMIA 01/02/2007  . HYPERTENSION 01/02/2007  . INSOMNIA 11/16/2008  . PROSTATE SPECIFIC ANTIGEN, ELEVATED 09/10/2008  . AAA (abdominal aortic aneurysm)   . Dysrhythmia     afib  . Shortness of breath   . COPD (chronic obstructive pulmonary disease)   . GERD (gastroesophageal reflux disease)   . Anemia   . History of blood transfusion   . Lung cancer dx'd 11/2012    metastatic non small cell lung cancer, adenocarcinoma    Past Surgical History  Procedure Laterality Date  . Transurethral resection of prostate  2004  . Total hip arthroplasty  2010, 2011    2010- left; 2011- right  . Upper gastrointestinal endoscopy    . Abdominal aortic endovascular stent graft N/A 04/14/2013    Procedure: ABDOMINAL AORTIC ENDOVASCULAR STENT GRAFT;  Surgeon: Conrad Kelford, MD;  Location: [facility] OR;  Service: Vascular;  Laterality: N/A;   Family History  Problem Relation Age of Onset  . Colon polyps Neg Hx   . Stomach cancer Neg Hx   . Colon cancer Neg Hx   . Cancer Neg Hx    History  Substance Use Topics  .  Smoking status: Former Smoker -- 1.00 packs/day for 40 years    Types: Cigarettes    Quit date: 06/18/1998  . Smokeless tobacco: Never Used  . Alcohol Use: No    Review of Systems  Unable to perform ROS: Mental status change      Allergies  Ativan  Home Medications   Prior to Admission medications   Medication Sig Start Date End Date Taking? Authorizing Provider  ALPRAZolam Duanne Moron) 0.25 MG tablet Take 0.25 mg by mouth at bedtime as needed for anxiety.   Yes Historical Provider, MD  folic acid (FOLVITE) 1 MG tablet Take 1 mg by mouth daily.   Yes Historical Provider, MD  acetaminophen (TYLENOL) 325 MG tablet Take 650 mg by mouth every 6 (six) hours as needed.    Historical Provider, MD  cholecalciferol (VITAMIN D) 1000 UNITS tablet Take 1,000 Units by mouth every morning.     Historical Provider, MD  dexamethasone (DECADRON) 4 MG tablet TAKE 1 TAB BY MOUTH TWICE DAILY THE DAY BEFORE, THE DAY OF, & THE DAY AFTER CHEMOTHERAPY EVERY 3 WKS 01/19/14   Curt Bears, MD  multivitamin-iron-minerals-folic acid (CENTRUM) chewable tablet Chew 1 tablet by mouth every morning.     Historical Provider, MD  naproxen sodium (ANAPROX) 220 MG tablet Take 220 mg by mouth 2 (two) times daily with a meal.  Historical Provider, MD  polyethylene glycol (MIRALAX / GLYCOLAX) packet Take 17 g by mouth daily.    Historical Provider, MD  propranolol (INDERAL) 10 MG tablet Take 1 tablet by mouth 2 (two) times daily. 04/02/13   Historical Provider, MD  Umeclidinium-Vilanterol (ANORO ELLIPTA) 62.5-25 MCG/INH AEPB Inhale 1 puff into the lungs daily.    Historical Provider, MD   BP 170/92  Pulse 79  Temp(Src) 97.5 F (36.4 C) (Oral)  Resp 15  SpO2 100% Physical Exam Physical Exam  Nursing note and vitals reviewed. Constitutional: He alert when aroused. He appears well-developed and well-nourished. No distress.  HENT:  Head: Normocephalic and atraumatic.  Eyes: Pupils are equal, round, and reactive to  light.  Neck: Normal range of motion.  Cardiovascular: Normal rate and intact distal pulses.   Pulmonary/Chest: No respiratory distress.  Abdominal: Normal appearance. He exhibits no distension.  Musculoskeletal: Normal range of motion.  Neurological: He is alert when aroused. No cranial nerve deficit.  no lateralizing weakness does have general weakness when he tries to stand.  Is unable to stand without assistance. Skin: Skin is warm and dry. No rash noted.    ED Course  Procedures (including critical care time) CRITICAL CARE Performed by: [place] Reviewed  CBC - Abnormal; Notable for the following:    RBC 3.92 (*)    HCT 36.9 (*)    All other components within normal limits  COMPREHENSIVE METABOLIC PANEL - Abnormal; Notable for the following:    Sodium 124 (*)    Chloride 85 (*)    Glucose, Bld 131 (*)    GFR calc non Af Amer 53 (*)    GFR calc Af Amer 62 (*)    Anion gap 16 (*)    All other components within normal limits  URINALYSIS, ROUTINE W REFLEX MICROSCOPIC  I-STAT CG4 LACTIC ACID, ED    Imaging Review Dg Chest Portable 1 View  04/13/2014   CLINICAL DATA:  History of lung carcinoma, tobacco use, altered mental status  EXAM: PORTABLE CHEST - 1 VIEW  COMPARISON:  03/30/2014  FINDINGS: Cardiac shadow is again mildly enlarged but stable. Elevation of left hemidiaphragm is again seen. The lungs are well aerated bilaterally. Multiple bilateral pulmonary nodules are again identified. Some of them on the right are less well visualized on the current exam. Very mild vascular congestion is noted. No acute bony abnormality is seen.  IMPRESSION: Changes consistent with multiple pulmonary nodules. Mild vascular congestion.   Electronically Signed   By: Inez Catalina M.D.   On: 04/13/2014 17:35     EKG Interpretation   Date/Time:  Tuesday April 13 2014 15:21:46 EDT Ventricular Rate:  93 PR Interval:  168 QRS Duration: 84 QT Interval:  376 QTC  Calculation: 467 R Axis:   -11 Text Interpretation:  Sinus rhythm with occasional Premature ventricular  complexes and Fusion complexes Nonspecific ST abnormality Abnormal ECG  Premature ventricular complexes are new Confirmed by Mycal Conde  MD, Emmely Bittinger  ([id number]) on 04/13/2014 4:07:05 PM     I discussed with the hospitalist service.  Patient will be admitted to medical floor.  Patient is a DO NOT RESUSCITATE. MDM   Final diagnoses:  Mental status alteration  Hyponatremia        Dot Lanes, MD 04/13/14 1803  Dot Lanes, MD 04/13/14 [phone]

## 2014-04-13 NOTE — Telephone Encounter (Signed)
Daughter called with update-pt took salt tablets for a few days and had sodium rechecked today -she does not know results. Pt is "delerious, has to be cued to put one foot in front of other to walk".

## 2014-04-13 NOTE — Progress Notes (Signed)
Patient Demographics  Tyler [name], is a 80 y.o. male  MRN: [id number]   DOB - 1933-01-15  Admit Date - 04/13/2014  Outpatient Primary MD for the patient is Maurice Small, D, MD   With History of -  Past Medical History  Diagnosis Date  . BENIGN PROSTATIC HYPERTROPHY 07/23/2008  . 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 HIP PAIN, LEFT 07/04/2007  . HYPERLIPIDEMIA 01/02/2007  . HYPERTENSION 01/02/2007  . INSOMNIA 11/16/2008  . PROSTATE SPECIFIC ANTIGEN, ELEVATED 09/10/2008  . AAA (abdominal aortic aneurysm)   . Dysrhythmia     afib  . Shortness of breath   . COPD (chronic obstructive pulmonary disease)   . GERD (gastroesophageal reflux disease)   . Anemia   . History of blood transfusion   . Lung cancer dx'd 11/2012    metastatic non small cell lung cancer, adenocarcinoma       Past Surgical History  Procedure Laterality Date  . Transurethral resection of prostate  2004  . Total hip arthroplasty  2010, 2011    2010- left; 2011- right  . Upper gastrointestinal endoscopy    . Abdominal aortic endovascular stent graft N/A 04/14/2013    Procedure: ABDOMINAL AORTIC ENDOVASCULAR STENT GRAFT;  Surgeon: Conrad Schall Circle, MD;  Location: [facility];  Service: Vascular;  Laterality: N/A;    in for   Chief Complaint  Patient presents with  . Altered Mental Status  . Abnormal Lab     HPI  Tyler [name]  is a 80 y.o. male, with known Hx of Lung cancer on chemo and radiation, sent by PCP for hyponatremia, sodium of 124 , baseline in the 130 , daughter reports sodium has been steadily declining, where she reports at one point he was diagnosed with SIADH, where he was recently started on salt tablets as well, she reports he has been having generalized weakness over the last 2-3 days, with decreased oral intake as well, patient workup was significant only for hyponatremia, was he denies any fever, chills, cough, productive sputum, dysuria or polyuria, but this requested that the patient for further  management and workup of his hyponatremia.    Review of Systems    In addition to the HPI above,  No Fever-chills, No Headache, No changes with Vision or hearing, No problems swallowing food or Liquids, No Chest pain, Cough or Shortness of Breath, No Abdominal pain, No Nausea or Vommitting, Bowel movements are regular, No Blood in stool or Urine, No dysuria, No new skin rashes or bruises, No new joints pains-aches,  No new  tingling, numbness in any extremity, as generalized weakness. No recent weight gain or loss, No polyuria, polydypsia or polyphagia, No significant Mental Stressors.  A full 10 point Review of Systems was done, except as stated above, all other Review of Systems were negative.   Social History History  Substance Use Topics  . Smoking status: Former Smoker -- 1.00 packs/day for 40 years    Types: Cigarettes    Quit date: 06/18/1998  . Smokeless tobacco: Never Used  . Alcohol Use: No     Family History Family History  Problem Relation Age of Onset  . Colon polyps Neg Hx   . Stomach cancer Neg Hx   . Colon cancer Neg Hx   . Cancer Neg Hx      Prior to Admission medications   Medication Sig Start Date End Date Taking? Authorizing Provider  ALPRAZolam Duanne Moron) 0.25 MG tablet Take 0.25 mg by mouth  at bedtime as needed for anxiety.   Yes Historical Provider, MD  folic acid (FOLVITE) 1 MG tablet Take 1 mg by mouth daily.   Yes Historical Provider, MD  acetaminophen (TYLENOL) 325 MG tablet Take 650 mg by mouth every 6 (six) hours as needed.    Historical Provider, MD  cholecalciferol (VITAMIN D) 1000 UNITS tablet Take 1,000 Units by mouth every morning.     Historical Provider, MD  dexamethasone (DECADRON) 4 MG tablet TAKE 1 TAB BY MOUTH TWICE DAILY THE DAY BEFORE, THE DAY OF, & THE DAY AFTER CHEMOTHERAPY EVERY 3 WKS 01/19/14   Curt Bears, MD  multivitamin-iron-minerals-folic acid (CENTRUM) chewable tablet Chew 1 tablet by mouth every morning.      Historical Provider, MD  naproxen sodium (ANAPROX) 220 MG tablet Take 220 mg by mouth 2 (two) times daily with a meal.    Historical Provider, MD  polyethylene glycol (MIRALAX / GLYCOLAX) packet Take 17 g by mouth daily.    Historical Provider, MD  propranolol (INDERAL) 10 MG tablet Take 1 tablet by mouth 2 (two) times daily. 04/02/13   Historical Provider, MD  Umeclidinium-Vilanterol (ANORO ELLIPTA) 62.5-25 MCG/INH AEPB Inhale 1 puff into the lungs daily.    Historical Provider, MD    Allergies  Allergen Reactions  . Ativan [Lorazepam]     hallucinations    Physical Exam  Vitals  Blood pressure 170/70, pulse 97, temperature 97.5 F (36.4 C), temperature source Oral, resp. rate 24, SpO2 100.00%.   1. General elderly male laying in bed in NAD,    2. Normal affect and insight, Not Suicidal or Homicidal, Awake Alert, Oriented X 3.  3. No F.N deficits, ALL C.Nerves Intact, Strength 5/5 all 4 extremities, Sensation intact all 4 extremities, Plantars down going.  4. Ears and Eyes appear Normal, Conjunctivae clear, PERRLA. Dry Oral Mucosa.  5. Supple Neck, No JVD, No cervical lymphadenopathy appriciated, No Carotid Bruits.  6. Symmetrical Chest wall movement, Good air movement bilaterally, CTAB.  7. RRR, No Gallops, Rubs or Murmurs, No Parasternal Heave.  8. Positive Bowel Sounds, Abdomen Soft, No tenderness, No organomegaly appriciated,No rebound -guarding or rigidity.  9.  No Cyanosis, delayed Skin Turgor, No Skin Rash or Bruise.  10. Good muscle tone,  joints appear normal , no effusions, Normal ROM.  11. No Palpable Lymph Nodes in Neck or Axillae    Data Review  CBC  Recent Labs Lab 04/06/14 2125 04/13/14 1628  WBC 8.2 7.6  HGB 12.4* 13.1  HCT 36.7* 36.9*  PLT 150 156  MCV 97.9 94.1  MCH 33.1 33.4  MCHC 33.8 35.5  RDW 13.7 13.8   ------------------------------------------------------------------------------------------------------------------  Chemistries     Recent Labs Lab 04/06/14 2125 04/13/14 1628  NA 130* 124*  K 4.2 4.3  CL 89* 85*  CO2 27 23  GLUCOSE 116* 131*  BUN 23 14  CREATININE 1.40* 1.25  CALCIUM 9.8 9.6  AST  --  22  ALT  --  13  ALKPHOS  --  45  BILITOT  --  0.7   ------------------------------------------------------------------------------------------------------------------ CrCl is unknown because both a height and weight (above a minimum accepted value) are required for this calculation. ------------------------------------------------------------------------------------------------------------------ No results found for this basename: TSH, T4TOTAL, FREET3, T3FREE, THYROIDAB,  in the last 72 hours   Coagulation profile No results found for this basename: INR, PROTIME,  in the last 168 hours ------------------------------------------------------------------------------------------------------------------- No results found for this basename: DDIMER,  in the last 72 hours -------------------------------------------------------------------------------------------------------------------  Cardiac  Enzymes No results found for this basename: CK, CKMB, TROPONINI, MYOGLOBIN,  in the last 168 hours ------------------------------------------------------------------------------------------------------------------ No components found with this basename: POCBNP,    ---------------------------------------------------------------------------------------------------------------  Urinalysis    Component Value Date/Time   COLORURINE YELLOW 04/13/2014 [place] 04/13/2014 1737   LABSPEC 1.007 04/13/2014 1737   PHURINE 7.0 04/13/2014 1737   GLUCOSEU NEGATIVE 04/13/2014 1737   HGBUR NEGATIVE 04/13/2014 1737   HGBUR moderate 07/15/2009 1034   BILIRUBINUR NEGATIVE 04/13/2014 1737   BILIRUBINUR n 04/24/2012 1206   KETONESUR NEGATIVE 04/13/2014 1737   PROTEINUR NEGATIVE 04/13/2014 1737   PROTEINUR n 04/24/2012  1206   UROBILINOGEN 0.2 04/13/2014 1737   UROBILINOGEN 0.2 04/24/2012 1206   NITRITE NEGATIVE 04/13/2014 1737   NITRITE n 04/24/2012 1206   LEUKOCYTESUR NEGATIVE 04/13/2014 1737    ----------------------------------------------------------------------------------------------------------------  Imaging results:   Dg Chest Portable 1 View  04/13/2014   CLINICAL DATA:  History of lung carcinoma, tobacco use, altered mental status  EXAM: PORTABLE CHEST - 1 VIEW  COMPARISON:  03/30/2014  FINDINGS: Cardiac shadow is again mildly enlarged but stable. Elevation of left hemidiaphragm is again seen. The lungs are well aerated bilaterally. Multiple bilateral pulmonary nodules are again identified. Some of them on the right are less well visualized on the current exam. Very mild vascular congestion is noted. No acute bony abnormality is seen.  IMPRESSION: Changes consistent with multiple pulmonary nodules. Mild vascular congestion.   Electronically Signed   By: Inez Catalina M.D.   On: 04/13/2014 17:35       Assessment & Plan  Active Problems:   Essential hypertension   COPD, severe   Non-small cell carcinoma of lung   Dehydration   Hyponatremia    Hyponatremia ./Dehydration -Patient is known to have history of SIADH , but as this point his hyponatremia appears to be secondary to hypovolemia , as he appears to be clinically dehydrated , will check urine sodium , urine osmolality , from osmolality , meanwhile will continue with IV normal saline at 100 mL/h .   lung cancer -Patient is following with hematology/oncology as an outpatient.  COPD -Does not have any active wheezing, will continue with as needed albuterol  Hypertension -We'll continue with propranolol, will add as needed IV hydralazine as it appears to be uncontrolled.  DVT Prophylaxis Heparin -SCDs  AM Labs Ordered, also please review Full Orders  Family Communication: Admission, patients condition and plan of care including  tests being ordered have been discussed with the patient and daughter and wife who indicate understanding and agree with the plan and Code Status.  Code Status DNR  Likely DC to  Home when stable  Condition GUARDED    Time spent in minutes : 60 minutes    ELGERGAWY, DAWOOD M.D on 04/13/2014 at 7:41 PM  Between 7am to 7pm - Pager - [phone]  After 7pm go to [email or web address] - password [email or web address]  And look for the night coverage person covering me after hours  [facility] Office  [phone]   **Disclaimer: This note may have been dictated with voice recognition software. Similar sounding words can inadvertently be transcribed and this note may contain transcription errors which may not have been corrected upon publication of note.**

## 2014-04-14 ENCOUNTER — Encounter (HOSPITAL_COMMUNITY): Payer: Self-pay | Admitting: *Deleted

## 2014-04-14 ENCOUNTER — Telehealth: Payer: Self-pay | Admitting: Medical Oncology

## 2014-04-14 ENCOUNTER — Ambulatory Visit: Payer: Medicare Other

## 2014-04-14 DIAGNOSIS — E860 Dehydration: Secondary | ICD-10-CM

## 2014-04-14 DIAGNOSIS — C3490 Malignant neoplasm of unspecified part of unspecified bronchus or lung: Secondary | ICD-10-CM

## 2014-04-14 LAB — CBC
HCT: 36.4 % — ABNORMAL LOW (ref 39.0–52.0)
HEMOGLOBIN: 12.7 g/dL — AB (ref 13.0–17.0)
MCH: 33.8 pg (ref 26.0–34.0)
MCHC: 34.9 g/dL (ref 30.0–36.0)
MCV: 96.8 fL (ref 78.0–100.0)
PLATELETS: 153 10*3/uL (ref 150–400)
RBC: 3.76 MIL/uL — AB (ref 4.22–5.81)
RDW: 13.8 % (ref 11.5–15.5)
WBC: 9.5 10*3/uL (ref 4.0–10.5)

## 2014-04-14 LAB — BASIC METABOLIC PANEL
ANION GAP: 14 (ref 5–15)
BUN: 13 mg/dL (ref 6–23)
CO2: 24 meq/L (ref 19–32)
Calcium: 9 mg/dL (ref 8.4–10.5)
Chloride: 92 mEq/L — ABNORMAL LOW (ref 96–112)
Creatinine, Ser: 1.24 mg/dL (ref 0.50–1.35)
GFR calc Af Amer: 62 mL/min — ABNORMAL LOW (ref >90)
GFR calc non Af Amer: 54 mL/min — ABNORMAL LOW (ref >90)
GLUCOSE: 113 mg/dL — AB (ref 70–99)
POTASSIUM: 3.8 meq/L (ref 3.7–5.3)
SODIUM: 130 meq/L — AB (ref 137–147)

## 2014-04-14 LAB — OSMOLALITY, URINE: Osmolality, Ur: 219 mOsm/kg — ABNORMAL LOW (ref 390–1090)

## 2014-04-14 MED ORDER — GUAIFENESIN ER 600 MG PO TB12
600.0000 mg | ORAL_TABLET | Freq: Two times a day (BID) | ORAL | Status: DC
  Administered 2014-04-14: 600 mg via ORAL
  Filled 2014-04-14 (×2): qty 1

## 2014-04-14 NOTE — Telephone Encounter (Signed)
Sodium level better  but mental status has not changed . Being referred to Hospice per family wishes.

## 2014-04-14 NOTE — Progress Notes (Signed)
RN was call to room by pt. Wife.  Stating that she and & his daughters want to sign him out.  Wife also stated that  Pt. Condom cath was bothering him.  RN emptied 500 ml of urine and removed condom cath.  Text paged Dr. Maryland Pink & informed of wife wishes to take pt. Home.  Returned called from Dr. [place] Pink and stated he can d/c pt. And will be up shortly to speak with pt. Wife & daughters.  Will continue to monitor.  Alphonzo Lemmings, RN

## 2014-04-14 NOTE — Progress Notes (Signed)
Family refused vitals signs this morning. RN was able to obtain BP due to hx of HTN and elevated BP in ED.

## 2014-04-14 NOTE — Progress Notes (Signed)
[name] discharged Home with H/H per MD order.  Discharge instructions reviewed and discussed with the patient, all questions and concerns answered. Copy of instructions and care notes for new diagnosis given to patient daughter.    Medication List         acetaminophen 325 MG tablet  Commonly known as:  TYLENOL  Take 650 mg by mouth every 6 (six) hours as needed for mild pain.     ANORO ELLIPTA 62.5-25 MCG/INH Aepb  Generic drug:  Umeclidinium-Vilanterol  Inhale 1 puff into the lungs daily.     cholecalciferol 1000 UNITS tablet  Commonly known as:  VITAMIN D  Take 1,000 Units by mouth every morning.     multivitamin-iron-minerals-folic acid chewable tablet  Chew 1 tablet by mouth every morning.     polyethylene glycol packet  Commonly known as:  MIRALAX / GLYCOLAX  Take 17 g by mouth daily as needed for mild constipation.     propranolol 10 MG tablet  Commonly known as:  INDERAL  Take 1 tablet by mouth 2 (two) times daily.        Patients skin is clean, dry and intact, no evidence of skin break down. IV site discontinued and catheter remains intact. Site without signs and symptoms of complications. Dressing and pressure applied.  Patient escorted to car by NT in a wheelchair,  no distress noted upon discharge.  Wynetta Emery, Shaneice Barsanti C 04/14/2014 10:49 AM

## 2014-04-14 NOTE — Progress Notes (Signed)
Utilization review completed.  

## 2014-04-14 NOTE — Care Management Note (Signed)
    Page 1 of 2   04/14/2014     10:55:45 AM CARE MANAGEMENT NOTE 04/14/2014  Patient:  Tyler [name], Tyler [name]   Account Number:  [id number]  Date Initiated:  04/14/2014  Documentation initiated by:  Tomi Bamberger  Subjective/Objective Assessment:   dx  hyponatremia, copd, lung ca  admit- lives with spouse.     Action/Plan:   Anticipated DC Date:  04/14/2014   Anticipated DC Plan:  [place]  CM consult      [facility]  HOME HEALTH   Choice offered to / List presented to:  C-3 Spouse        HH arranged  HH-1 RN  [place]      [place] agency  [place]   Status of service:  Completed, signed off Medicare Important Message given?  NA - LOS <3 / Initial given by admissions (If response is "NO", the following Medicare IM given date fields will be blank) Date Medicare IM given:   Medicare IM given by:   Date Additional Medicare IM given:   Additional Medicare IM given by:    Discharge Disposition:  [place]  Per UR Regulation:  Reviewed for med. necessity/level of care/duration of stay  If discussed at [place] of Stay Meetings, dates discussed:    Comments:  04/14/14 [place], BSN [phone] patient lives with spouse, she wants to take patient home, she wants [facility] services.  NCM made referral to Ruffin Pyo with [facility], faxed demo, h/p and orders for [facility], PT, aide and Social worker.  Soc will begin 24-48 hrs post dc.  Ruffin Pyo will call me back to let me know if the office is ok with this.  Wife's phone is [phone] Lana. NCM recieved call from Neoma Laming with [facility] and she states everthing is a go.  They will be out to see patient tomorrow.

## 2014-04-15 ENCOUNTER — Encounter (HOSPITAL_COMMUNITY): Payer: Medicare Other

## 2014-04-15 NOTE — Progress Notes (Signed)
Discharge Note from Pulmonary Rehab  Tyler [name] has been discharged from pulmonary rehab due to inability to tolerate the exercise and attend the program.  His sodium level has been low and patient experiences confusion when this occurs.  He has become much weaker and per his significant other and patient he is too weak to exercise with [place].  Hospice has been called in to help care for the patient.  His goals were unmet which were to increase his endurance and walk with his significant other for exercise.

## 2014-04-15 NOTE — Addendum Note (Signed)
Encounter addended by: Liliane Channel, RN on: 04/15/2014  8:57 AM<BR>     Documentation filed: Notes Section

## 2014-04-15 NOTE — Discharge Summary (Signed)
Discharge Summary  Tyler [name] EXH:[id number] DOB: December 07, 1932  PCP: Maurice Small, D, MD  Admit date: 04/13/2014 Discharge date: 04/14/2014  Time spent: 25 minutes  Recommendations for Outpatient Follow-up:  1. Patient will follow-up with his oncologist and primary care physician as needed. 2. Patient has been given health services including PT, OT, RN and aide and at my request a social worker for hospice/palliative care referral   Discharge Diagnoses:  Active Hospital Problems   Diagnosis Date Noted  . Hyponatremia 03/16/2013  . Dehydration 03/16/2013  . Non-small cell carcinoma of lung 01/02/2013  . COPD, severe 12/08/2012  . Essential hypertension 01/02/2007    Resolved Hospital Problems   Diagnosis Date Noted Date Resolved  No resolved problems to display.    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  Hospital Course:  Active Problems:   Essential hypertension: Antihypertensives held while patient was dehydrated   COPD, severe: Stable   Non-small cell carcinoma of lung: Underlying issue, patient would benefit from hospice   Dehydration   Hyponatremia: Principal problem. Lab work otherwise was unrevealing. His low pectus was simple dehydration. With IV fluids, sodium improved to 1:30 by the following morning. Patient himself remained restless and looked to be quite uncomfortable being in the hospital. The patient as well as his wife felt like there was little  improvement overall and wanted to be discharged. I was able to convince them to accept home health services to better support them. I was inclined to consult palliative care, however the patient and family were pretty adamant about wanting to be discharged as early as possible.   Procedures:  None  Consultations:  None  Discharge Exam: BP 135/85  Pulse 81  Temp(Src) 98.4 F (36.9 C) (Oral)  Resp 20  Ht 5\' 8"  (1.727 m)  Wt 77.565 kg (171 lb)  BMI 26.01 kg/m2  SpO2 97%  General: Alert and oriented 2, uncomfortable fatigue Cardiovascular: Regular rate and rhythm, S1-S2 Respiratory: Decreased breath sounds throughout  Discharge Instructions You were cared for by a hospitalist during your hospital stay. If you have any questions about your discharge medications or the care you received while you were in the hospital after you are discharged, you can call the unit and asked to speak with the hospitalist on call if the hospitalist that took care of you is not available. Once you are discharged, your primary care physician will handle any further medical issues. Please note that NO REFILLS for any discharge medications will be authorized once you are discharged, as it is imperative that you return to your primary care physician (or establish a relationship with a primary care physician if you do not have one) for your aftercare needs so that they can reassess your need for medications and monitor your lab values.  Discharge Instructions   Diet - low sodium heart healthy    Complete by:  As directed      Increase activity slowly    Complete by:  As directed             Medication List         acetaminophen 325 MG tablet  Commonly known as:  TYLENOL  Take 650 mg by mouth every 6 (six) hours as needed for mild pain.     ANORO ELLIPTA 62.5-25 MCG/INH Aepb  Generic drug:  Umeclidinium-Vilanterol  Inhale 1 puff into the lungs daily.     cholecalciferol 1000 UNITS tablet  Commonly  known as:  VITAMIN D  Take 1,000 Units by mouth every morning.     multivitamin-iron-minerals-folic acid chewable tablet  Chew 1 tablet by mouth every morning.     polyethylene glycol packet  Commonly known as:  MIRALAX / GLYCOLAX  Take 17 g by mouth daily as needed for mild constipation.     propranolol 10 MG tablet  Commonly known as:  INDERAL  Take 1 tablet by mouth 2 (two) times daily.       Allergies  Allergen Reactions  . Ativan [Lorazepam]     hallucinations      The results of significant diagnostics from this hospitalization (including imaging, microbiology, ancillary and laboratory) are listed below for reference.    Significant Diagnostic Studies: Dg Chest 2 View  03/30/2014   CLINICAL DATA:  Weakness.  History of lung cancer.  Chemotherapy.  EXAM: CHEST  2 VIEW  COMPARISON:  CT 03/22/2014.  Chest x-ray 02/08/2014.  FINDINGS: Mediastinum hilar structures normal. Bilateral pulmonary nodules are noted consistent metastatic disease. These are increased in number from prior chest x-ray 02/08/2014. No pleural effusion or pneumothorax. Stable elevation left hemidiaphragm. Stable cardiomegaly with normal pulmonary vascularity. No acute bony abnormality identified.  IMPRESSION: 1. Multiple bilateral pulmonary nodules, increased from prior chest x-ray of 02/08/2014. The/consistent with pulmonary metastatic disease. 2. Stable cardiomegaly.  No acute cardiopulmonary disease.   Electronically Signed   By: Marcello Moores  Register   On: 03/30/2014 11:51   Ct Head W Wo Contrast  03/26/2014   CLINICAL DATA:  80 year old male with increased fatigue and current history of metastatic lung cancer. Subsequent encounter.  EXAM: CT HEAD WITHOUT AND WITH CONTRAST  TECHNIQUE: Contiguous axial images were obtained from the base of the skull through the vertex without and with intravenous contrast  CONTRAST:  57mL OMNIPAQUE IOHEXOL 300 MG/ML  SOLN  COMPARISON:  Head CT without and with contrast 12/25/2012.   FINDINGS: Visualized paranasal sinuses and mastoids are clear. Visualized orbit soft tissues are within normal limits. Visualized scalp soft tissues are within normal limits. Calcified atherosclerosis at the skull base.  No acute or suspicious skull lesion identified.  Stable cerebral volume. No midline shift, mass effect, or evidence of intracranial mass lesion. No ventriculomegaly. No acute intracranial hemorrhage identified. Patchy and confluent cerebral white matter hypodensity not significantly changed and nonspecific. Small dystrophic calcification of the right superior frontal gyrus on series 2, image 25 is unchanged. No evidence of cortically based acute infarction identified. No abnormal enhancement identified. Major intracranial vascular structures are enhancing.  IMPRESSION: 1.  No acute or metastatic intracranial abnormality. 2. Chronic advanced nonspecific cerebral white matter changes, most commonly due to chronic small vessel disease.   Electronically Signed   By: Lars Pinks M.D.   On: 03/26/2014 18:21   Ct Chest W Contrast  03/22/2014   CLINICAL DATA:  Followup of left upper lobe non-small-cell lung carcinoma. Restaging study.  EXAM: CT CHEST, ABDOMEN, AND PELVIS WITH CONTRAST  TECHNIQUE: Multidetector CT imaging of the chest, abdomen and  pelvis was performed following the standard protocol during bolus administration of intravenous contrast.  CONTRAST:  15mL OMNIPAQUE IOHEXOL 300 MG/ML  SOLN  COMPARISON:  01/08/2014  FINDINGS: CT CHEST FINDINGS  Since the prior study, there has been worsening of metastatic disease to the lungs. Numerous bilateral pulmonary nodules are noted many of which have increased in size. There are no convincing new pulmonary nodules, however. Several reference measurements were made. Largest nodule lies in the right lower lobe currently measuring 2 cm transversely where it had measured 1.8 cm. A cavitary nodule seen adjacent to this in the right lower lobe is less  well-defined than on the prior study, but is mildly larger also measuring 2 cm where it had measured 1.8 cm. There is an anteromedial right middle lobe nodule currently measuring 13 mm where it had measured 10 mm a superior segment pulmonary nodule measures 10 mm where it had measures 7 mm. There is a left lower lobe pulmonary nodule currently measuring 8.5 mm where it had measures 7.5 mm. Inferior to this a left lower lobe pulmonary nodule measures 12.6 mm where it had measured 11.8 mm. Near the post left lung base There is a nodule measuring 12.6 mm where it had measured 13.8 mm.  No pleural effusions.  Mild increase in size of a single mediastinal lymph node. Right subcarinal lymph node measures 2 cm in short axis where it had measured 1.4 cm. Other prominent mediastinal nodes noted previously are stable. No neck base or axillary masses or adenopathy.  Heart is normal in size. There are dense coronary artery calcifications. Great vessels are normal in caliber.  CT ABDOMEN AND PELVIS FINDINGS  No liver mass or focal lesion.  Normal spleen, gallbladder and pancreas.  Thickening versus a small nodule from the anterior limb of the left adrenal gland measuring 11 mm. This is unchanged. This previous measures 7 mm in thickness. Normal right adrenal gland.  Bilateral low-density renal masses consistent with cysts. Bilateral renal cortical thinning. No hydronephrosis. Normal ureters and bladder.  Prostate gland is enlarged but partly obscured by bilateral hip prosthesis artifact.  There is several prominent periceliac and retroperitoneal lymph nodes. The largest is a left periaortic node measuring 1 cm in short axis. This node previously measured 7 mm.  No abnormal fluid collections.  There are scattered left colon diverticula. No diverticulitis. A portion of the sigmoid colon enters a left inguinal hernia without obstruction, strangulation or incarceration. This is stable. Colon otherwise unremarkable. Normal small  bowel.  6.7 cm x 6 cm infrarenal abdominal aortic aneurysm has been excluded with a stent graft. This is stable.  MUSCULOSKELETAL:  No osteoblastic or osteolytic lesions. Diffuse bony demineralization. Degenerative changes noted throughout the visualized spine. Bilateral hip prostheses are stable in well positioned.  IMPRESSION: 1. Mild worsening of metastatic lung carcinoma since the prior study. This is reflected by enlargement of several of the multiple bilateral pulmonary nodules. No convincing new pulmonary nodule. 2. Single right subcarinal lymph node has mildly increased in size from prior study, currently 2 cm in short axis where it had measured 1.4 cm. 3. No other evidence of metastatic disease in the chest. 4. Nodular type enlargement of the anterior limb of the left adrenal gland, measuring 11 mm on the current exam and 7 mm previously. This is equivocal. It could reflect nodular hyperplasia or metastatic disease. 5. No other evidence of metastatic disease below the diaphragm. No other change from the prior study. 6. No evidence of skeletal  metastatic disease.   Electronically Signed   By: Lajean Manes M.D.   On: 03/22/2014 12:28   Ct Abdomen Pelvis W Contrast  03/22/2014   CLINICAL DATA:  Followup of left upper lobe non-small-cell lung carcinoma. Restaging study.  EXAM: CT CHEST, ABDOMEN, AND PELVIS WITH CONTRAST  TECHNIQUE: Multidetector CT imaging of the chest, abdomen and pelvis was performed following the standard protocol during bolus administration of intravenous contrast.  CONTRAST:  116mL OMNIPAQUE IOHEXOL 300 MG/ML  SOLN  COMPARISON:  01/08/2014  FINDINGS: CT CHEST FINDINGS  Since the prior study, there has been worsening of metastatic disease to the lungs. Numerous bilateral pulmonary nodules are noted many of which have increased in size. There are no convincing new pulmonary nodules, however. Several reference measurements were made. Largest nodule lies in the right lower lobe currently  measuring 2 cm transversely where it had measured 1.8 cm. A cavitary nodule seen adjacent to this in the right lower lobe is less well-defined than on the prior study, but is mildly larger also measuring 2 cm where it had measured 1.8 cm. There is an anteromedial right middle lobe nodule currently measuring 13 mm where it had measured 10 mm a superior segment pulmonary nodule measures 10 mm where it had measures 7 mm. There is a left lower lobe pulmonary nodule currently measuring 8.5 mm where it had measures 7.5 mm. Inferior to this a left lower lobe pulmonary nodule measures 12.6 mm where it had measured 11.8 mm. Near the post left lung base There is a nodule measuring 12.6 mm where it had measured 13.8 mm.  No pleural effusions.  Mild increase in size of a single mediastinal lymph node. Right subcarinal lymph node measures 2 cm in short axis where it had measured 1.4 cm. Other prominent mediastinal nodes noted previously are stable. No neck base or axillary masses or adenopathy.  Heart is normal in size. There are dense coronary artery calcifications. Great vessels are normal in caliber.  CT ABDOMEN AND PELVIS FINDINGS  No liver mass or focal lesion.  Normal spleen, gallbladder and pancreas.  Thickening versus a small nodule from the anterior limb of the left adrenal gland measuring 11 mm. This is unchanged. This previous measures 7 mm in thickness. Normal right adrenal gland.  Bilateral low-density renal masses consistent with cysts. Bilateral renal cortical thinning. No hydronephrosis. Normal ureters and bladder.  Prostate gland is enlarged but partly obscured by bilateral hip prosthesis artifact.  There is several prominent periceliac and retroperitoneal lymph nodes. The largest is a left periaortic node measuring 1 cm in short axis. This node previously measured 7 mm.  No abnormal fluid collections.  There are scattered left colon diverticula. No diverticulitis. A portion of the sigmoid colon enters a left  inguinal hernia without obstruction, strangulation or incarceration. This is stable. Colon otherwise unremarkable. Normal small bowel.  6.7 cm x 6 cm infrarenal abdominal aortic aneurysm has been excluded with a stent graft. This is stable.  MUSCULOSKELETAL:  No osteoblastic or osteolytic lesions. Diffuse bony demineralization. Degenerative changes noted throughout the visualized spine. Bilateral hip prostheses are stable in well positioned.  IMPRESSION: 1. Mild worsening of metastatic lung carcinoma since the prior study. This is reflected by enlargement of several of the multiple bilateral pulmonary nodules. No convincing new pulmonary nodule. 2. Single right subcarinal lymph node has mildly increased in size from prior study, currently 2 cm in short axis where it had measured 1.4 cm. 3. No other evidence of metastatic  disease in the chest. 4. Nodular type enlargement of the anterior limb of the left adrenal gland, measuring 11 mm on the current exam and 7 mm previously. This is equivocal. It could reflect nodular hyperplasia or metastatic disease. 5. No other evidence of metastatic disease below the diaphragm. No other change from the prior study. 6. No evidence of skeletal metastatic disease.   Electronically Signed   By: Lajean Manes M.D.   On: 03/22/2014 12:28   Dg Chest Portable 1 View  04/13/2014   CLINICAL DATA:  History of lung carcinoma, tobacco use, altered mental status  EXAM: PORTABLE CHEST - 1 VIEW  COMPARISON:  03/30/2014  FINDINGS: Cardiac shadow is again mildly enlarged but stable. Elevation of left hemidiaphragm is again seen. The lungs are well aerated bilaterally. Multiple bilateral pulmonary nodules are again identified. Some of them on the right are less well visualized on the current exam. Very mild vascular congestion is noted. No acute bony abnormality is seen.  IMPRESSION: Changes consistent with multiple pulmonary nodules. Mild vascular congestion.   Electronically Signed   By: Inez Catalina M.D.   On: 04/13/2014 17:35    Microbiology: No results found for this or any previous visit (from the past 240 hour(s)).   Labs: Basic Metabolic Panel:  Recent Labs Lab 04/13/14 1628 04/14/14 0632  NA 124* 130*  K 4.3 3.8  CL 85* 92*  CO2 23 24  GLUCOSE 131* 113*  BUN 14 13  CREATININE 1.25 1.24  CALCIUM 9.6 9.0   Liver Function Tests:  Recent Labs Lab 04/13/14 1628  AST 22  ALT 13  ALKPHOS 45  BILITOT 0.7  PROT 6.5  ALBUMIN 3.7   No results found for this basename: LIPASE, AMYLASE,  in the last 168 hours No results found for this basename: AMMONIA,  in the last 168 hours CBC:  Recent Labs Lab 04/13/14 1628 04/14/14 0632  WBC 7.6 9.5  HGB 13.1 12.7*  HCT 36.9* 36.4*  MCV 94.1 96.8  PLT 156 153   Cardiac Enzymes: No results found for this basename: CKTOTAL, CKMB, CKMBINDEX, TROPONINI,  in the last 168 hours BNP: BNP (last 3 results) No results found for this basename: PROBNP,  in the last 8760 hours CBG: No results found for this basename: GLUCAP,  in the last 168 hours     Signed:  Annita Brod  [facility] 04/15/2014, 8:14 AM

## 2014-04-16 ENCOUNTER — Telehealth: Payer: Self-pay | Admitting: Medical Oncology

## 2014-04-16 NOTE — Telephone Encounter (Signed)
I returned sons call. He is asking re options to treat/manage  his confusion . Hospice was out yesterday to see pt. Dr Justin Mend is attending and Dr Karie Georges is doing symptom management . Pts pain is controlled with 1/2 tablet of hydrocodone 5/325 mg every 4 hours. His daughter may try motrin .

## 2014-04-16 NOTE — Telephone Encounter (Signed)
I called son back and told him I sent note to [facility] about other options for treating confusion.Marland Kitchen He said he is not looking for a " panacea" -he just wants his dad to be comfortable and restful. He is going to ask the RN for something to help his father sleep. I contacted hospice and pt oxygen sat 97%-Son i soging to keep in contact with hospice to keep his dad comfortable but wants to hear back from [place] on Monday.Marland Kitchen

## 2014-04-20 ENCOUNTER — Encounter (HOSPITAL_COMMUNITY): Payer: Medicare Other

## 2014-04-21 ENCOUNTER — Ambulatory Visit (INDEPENDENT_AMBULATORY_CARE_PROVIDER_SITE_OTHER): Admitting: Neurology

## 2014-04-21 ENCOUNTER — Encounter: Payer: Self-pay | Admitting: Neurology

## 2014-04-21 VITALS — BP 112/84 | HR 74 | Resp 16 | Wt 165.0 lb

## 2014-04-21 DIAGNOSIS — G253 Myoclonus: Secondary | ICD-10-CM | POA: Diagnosis not present

## 2014-04-21 DIAGNOSIS — R410 Disorientation, unspecified: Secondary | ICD-10-CM

## 2014-04-21 NOTE — Progress Notes (Signed)
NEUROLOGY CONSULTATION NOTE  [name] MRN: [id number] DOB: 1933-03-15  Referring provider: Dr. Maurice Small Primary care provider: Dr. Maurice Small  Reason for consult:  Altered mental status  Dear Dr Justin Mend:  Thank you for your kind referral of Tyler [name] for consultation of the above symptoms. Although his history is well known to you, please allow me to reiterate it for the purpose of our medical record. The patient was accompanied to the clinic by his daughter Tyler [name] who provides majority of the information as Mr. Francisco is very fatigued and dysarthric today. Records and images were personally reviewed where available.  HISTORY OF PRESENT ILLNESS: This is a 80 year old right-handed man with a history of metastatic non-small cell lung cancer s/p radiation and chemotherapy, who has had a decline in mental status over the past few months. 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 His daughter Tyler [name] has been here for the past month, and has noticed that he has moments where he is lucid and appears aware of his surroundings, his speech would be more clear, however today speech is slurred and difficult to understand. He had less sleep due to waking up early for the appointment today. She reports that in the past 3 weeks, he would have tongue movements and mouth movements that constantly occur throughout the day. He would be picking at his clothes or picking at objects around him. He has body jerks and twitches. She reports that he appears to have difficulty making his body do what his brain is telling him, for instance they have noticed leg weakness and at times inability to walk,  however would find him sitting in his wheelchair apparently went there by himself from the bed. He keeps water in his mouth and eventually swallows it. He has had chronic back pain and is given Ibuprofen. He was recently prescribed Xanax, which his daughter occasionally gives him when agitated. He has had hospice come for the past few days, and his daughter is concerned about narcotic use for pain, that this may cloud his mental status even more. They wonder about other potential metabolic/treatable causes of mental status change before committing him to comfort care. They do not wish for an MRI brain, but agree to an EEG. I personally reviewed head CT without contrast done 03/26/14 which showed patchy and confluent white matter hypodensities bilaterally, not significantly changed from scan in 2014. There is small dystrophic calcification in the right superior frontal gyrus, unchanged as well.   Laboratory Data: Lab Results  Component Value Date   WBC 9.5 04/14/2014   HGB 12.7* 04/14/2014   HCT 36.4* 04/14/2014   MCV 96.8 04/14/2014   PLT 153 04/14/2014     Chemistry      Component Value Date/Time   NA 130* 04/14/2014 0632   NA 130* 03/24/2014 1042   K 3.8 04/14/2014 0632   K 4.3 03/24/2014 1042   CL 92* 04/14/2014 0632   CO2 24 04/14/2014 0632   CO2 23 03/24/2014 1042   BUN 13 04/14/2014 0632   BUN 22.8 03/24/2014 1042   CREATININE 1.24 04/14/2014 0632   CREATININE 1.5* 03/24/2014 1042  Component Value Date/Time   CALCIUM 9.0 04/14/2014 0632   CALCIUM 9.5 03/24/2014 1042   ALKPHOS 45 04/13/2014 1628   ALKPHOS 46 03/24/2014 1042   AST 22 04/13/2014 1628   AST 18 03/24/2014 1042   ALT 13 04/13/2014 1628   ALT 22 03/24/2014 1042   BILITOT 0.7 04/13/2014 1628   BILITOT 0.49 03/24/2014 1042      PAST MEDICAL HISTORY: Past Medical History  Diagnosis Date  . BENIGN PROSTATIC HYPERTROPHY 07/23/2008  . 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 HIP PAIN, LEFT 07/04/2007  .  HYPERLIPIDEMIA 01/02/2007  . HYPERTENSION 01/02/2007  . INSOMNIA 11/16/2008  . PROSTATE SPECIFIC ANTIGEN, ELEVATED 09/10/2008  . AAA (abdominal aortic aneurysm)   . Dysrhythmia     afib  . Shortness of breath   . COPD (chronic obstructive pulmonary disease)   . GERD (gastroesophageal reflux disease)   . Anemia   . History of blood transfusion   . Lung cancer dx'd 11/2012    metastatic non small cell lung cancer, adenocarcinoma     PAST SURGICAL HISTORY: Past Surgical History  Procedure Laterality Date  . Transurethral resection of prostate  2004  . Total hip arthroplasty  2010, 2011    2010- left; 2011- right  . Upper gastrointestinal endoscopy    . Abdominal aortic endovascular stent graft N/A 04/14/2013    Procedure: ABDOMINAL AORTIC ENDOVASCULAR STENT GRAFT;  Surgeon: Conrad [place], MD;  Location: [facility] OR;  Service: Vascular;  Laterality: N/A;    MEDICATIONS: Current Outpatient Prescriptions on File Prior to Visit  Medication Sig Dispense Refill  . propranolol (INDERAL) 10 MG tablet Take 1 tablet by mouth 2 (two) times daily.     No current facility-administered medications on file prior to visit.    ALLERGIES: Allergies  Allergen Reactions  . Ativan [Lorazepam]     hallucinations    FAMILY HISTORY: Family History  Problem Relation Age of Onset  . Colon polyps Neg Hx   . Stomach cancer Neg Hx   . Colon cancer Neg Hx   . Cancer Neg Hx     SOCIAL HISTORY: History   Social History  . Marital Status: Widowed    Spouse Name: N/A    Number of Children: N/A  . Years of Education: N/A   Occupational History  . Retired Software engineer    Social History Main Topics  . Smoking status: Former Smoker -- 1.00 packs/day for 40 years    Types: Cigarettes    Quit date: 06/18/1998  . Smokeless tobacco: Never Used  . Alcohol Use: No  . Drug Use: No  . Sexual Activity: No   Other Topics Concern  . Not on file   Social History Narrative    REVIEW OF SYSTEMS unable to  obtain due to altered mental status and occasionally unintelligible speech  PHYSICAL EXAM: Filed Vitals:   04/21/14 0924  BP: 112/84  Pulse: 74  Resp: 16   General: No acute distress, sitting on wheelchair, appears tired Head:  Normocephalic/atraumatic Eyes: Fundoscopic exam shows bilateral sharp discs, no vessel changes, exudates, or hemorrhages Neck: supple, no paraspinal tenderness, full range of motion Back: No paraspinal tenderness Heart: regular rate and rhythm Lungs: Clear to auscultation bilaterally. Vascular: No carotid bruits. Skin/Extremities: No rash, no edema Neurological Exam: Mental status: appears fatigued, drowsy, arousable with verbal stimulation. He would intermittently follow commands correctly. He is very dysarthric, at times with unintelligible speech. He is oriented to person, month, but cannot answer any other  questions. He cannot tell me his daughter's or girlfriend's names.  Cranial nerves: CN I: not tested CN II: pupils equal, round and reactive to light, visual fields intact, fundi unremarkable. CN III, IV, VI:  full range of motion, no nystagmus, no ptosis CN V: facial sensation intact CN VII: upper and lower face symmetric CN VIII: hearing intact to finger rub CN IX, X: gag intact, uvula midline CN XI: sternocleidomastoid and trapezius muscles intact CN XII: tongue midline Bulk & Tone: normal, +fasciculations.  Motor: 5/5 on both UE, at least 4/5 on both LE but unable to do formal muscle testing due to altered mental status, no pronator drift. Sensation: per patient, intact to light touch, cold, pin, but unclear if this is accurate.  Deep Tendon Reflexes: +1 throughout, no ankle clonus Plantar responses: downgoing bilaterally Cerebellar: no incoordination on finger to nose testing Gait: not tested Tremor: no resting tremor. He is noted to have myoclonic jerks mostly of the right leg, as well as intermittent body jerks in both upper extremities.    IMPRESSION: This is a 80 year old right-handed man with a history of metastatic lung cancer s/p radiation and chemotherapy, presenting for evaluation of worsening altered mental status and leg weakness. His neurological exam shows significant dysarthria, he intermittently follows commands correctly with no clear focal findings. His head CT shows patchy and confluent subcortical hypodensities, which may relate to chronic microvascular disease, however without MRI, metastatic disease to the brain cannot be ruled out. Family does not wish for MRI. The picking behavior and possible oral automatisms are concerning for possible seizures, which can also cause AMS, they are agreeable to doing an EEG. He is also noted to have myoclonic jerks today, which may be seen with paraneoplastic disorder, check CMP, TSH, magnesium, calcium, B12, ammonia levels. We discussed goals of care and expectations from workup planned, his daughter's goal is quality of life and expressed understanding.  I will keep you updated.  Thank you for allowing me to participate in the care of this patient. Please do not hesitate to call for any questions or concerns.   Ellouise Newer, M.D.  CC: Dr. Justin Mend

## 2014-04-21 NOTE — Patient Instructions (Signed)
1. Schedule routine EEG 2. Bloodwork for CMP, magnesium, calcium, TSH, vitamin B12, ammonia 3. Call our office for any problems

## 2014-04-22 ENCOUNTER — Telehealth: Payer: Self-pay | Admitting: Family Medicine

## 2014-04-22 ENCOUNTER — Encounter (HOSPITAL_COMMUNITY): Payer: Medicare Other

## 2014-04-22 NOTE — Telephone Encounter (Signed)
-----   Message from Blenda Peals sent at 04/22/2014  8:30 AM EST ----- Regarding: Tyler [name] from Hospice  Contact: [phone] Tyler [name] called requesting to speak with your regarding patient. Please call her back [phone]

## 2014-04-22 NOTE — Telephone Encounter (Signed)
Spoke with Wendy/[facility]. Wanted to make [place] aware that they don't cover any type of testing that would be ordered for patient for his neurological consult/evaluation. She states that the patient would be responsible for any charges that would be billed for services. She states they did inform the family of this as well. When patient was seen in our office yesterday, pts daughter was under the impression that his pts Hospice nurse could draw blood for  Labs that Dr. Delice Lesch wanted. Abigail Butts also informed me that they were not able to do that either. Patient can have labs drawn at [facility] when he comes in for his eeg.

## 2014-04-27 ENCOUNTER — Telehealth: Payer: Self-pay | Admitting: Neurology

## 2014-04-27 NOTE — Telephone Encounter (Signed)
Referring Dr Justin Mend wanted the office notes  Faxed to them so i faxed them to [facility] atten at [phone]

## 2014-05-17 ENCOUNTER — Ambulatory Visit: Payer: Medicare Other | Admitting: Pulmonary Disease

## 2014-05-18 DEATH — deceased

## 2014-05-27 ENCOUNTER — Encounter (HOSPITAL_COMMUNITY): Payer: Self-pay | Admitting: Vascular Surgery

## 2014-07-02 ENCOUNTER — Ambulatory Visit: Payer: Medicare Other | Admitting: Family

## 2014-07-02 ENCOUNTER — Other Ambulatory Visit (HOSPITAL_COMMUNITY): Payer: Medicare Other

## 2014-09-30 IMAGING — CR DG CHEST 2V
2 series · 2 of 2 positions shown · non-contrast
Comparison: CT 01/08/2014.

CLINICAL DATA: cough

EXAM:
CHEST  2 VIEW

[view not recorded (1 of 2)]
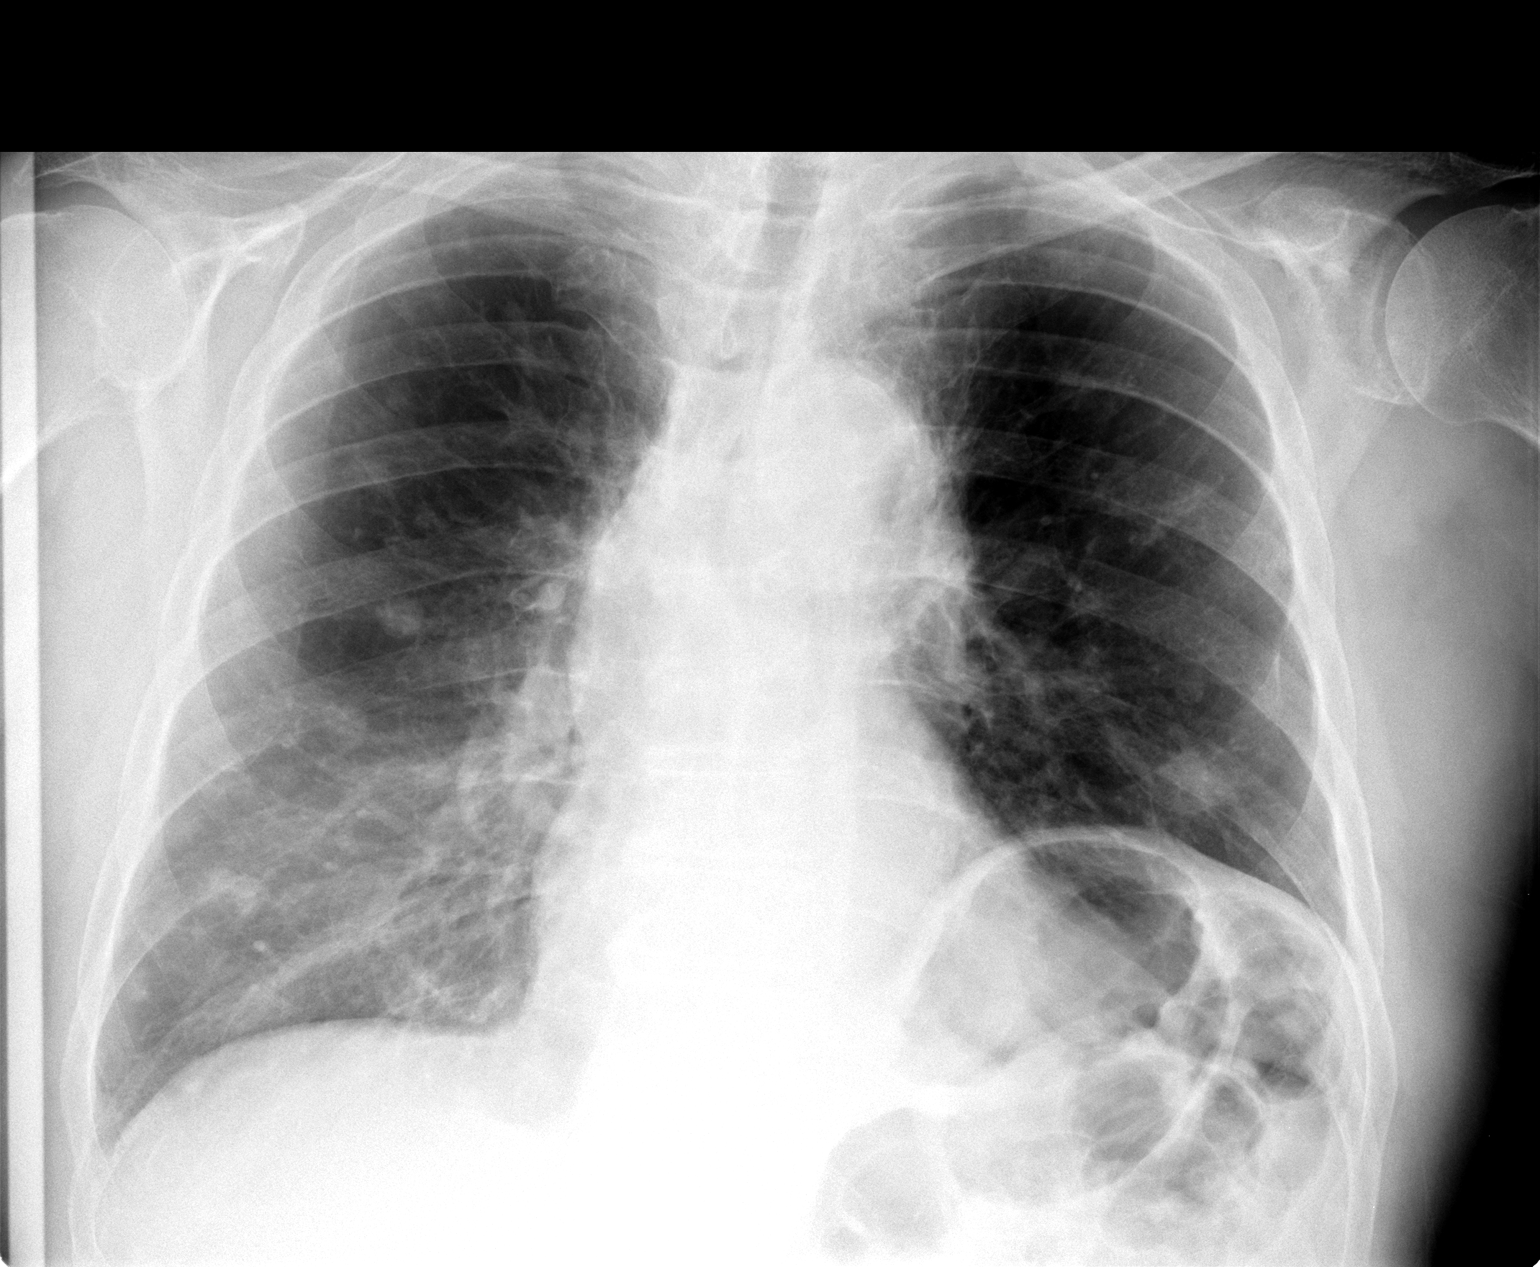

[view not recorded (2 of 2)]
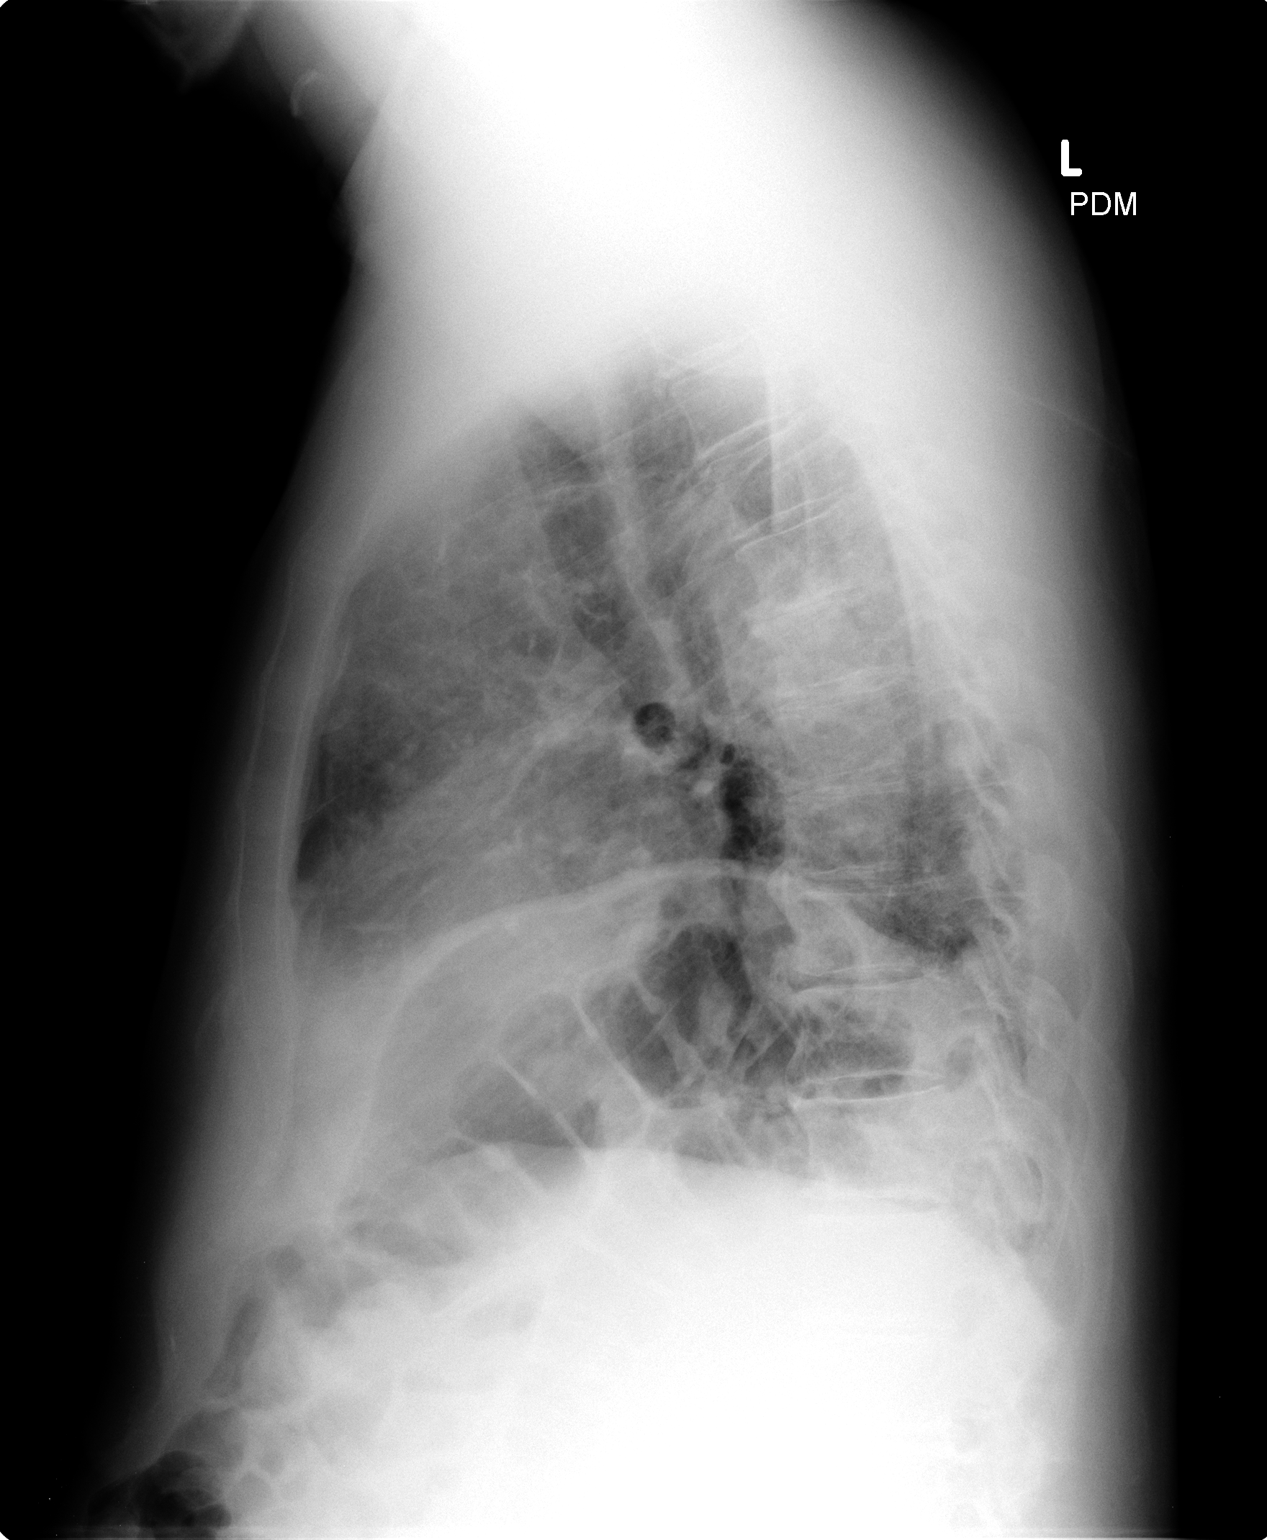

[2 of 2 positions shown; findings below may reference images not displayed]

FINDINGS: Mediastinum hilar structures are stable. Heart size stable.
Bilateral pulmonary nodular densities are again noted consistent
with metastatic disease. Stable elevation left hemidiaphragm. No
pleural effusion or pneumothorax. No focal osseous abnormality.
IMPRESSION: Persistent bilateral pulmonary nodules consistent with pulmonary
metastatic disease. Similar findings noted on prior chest CT of
01/08/2014.

## 2014-12-13 ENCOUNTER — Other Ambulatory Visit: Payer: Self-pay

## 2016-06-15 ENCOUNTER — Other Ambulatory Visit: Payer: Self-pay | Admitting: Nurse Practitioner
